# Patient Record
Sex: Male | Born: 1950 | Race: White | Hispanic: No | Marital: Married | Smoking: Never smoker
Health system: Southern US, Community
[De-identification: ages and names within clinical notes are randomized; demographics above are authoritative.]

## PROBLEM LIST (undated history)

## (undated) DIAGNOSIS — M75100 Unspecified rotator cuff tear or rupture of unspecified shoulder, not specified as traumatic: Secondary | ICD-10-CM

## (undated) DIAGNOSIS — E785 Hyperlipidemia, unspecified: Secondary | ICD-10-CM

## (undated) DIAGNOSIS — M1990 Unspecified osteoarthritis, unspecified site: Secondary | ICD-10-CM

## (undated) DIAGNOSIS — T7840XA Allergy, unspecified, initial encounter: Secondary | ICD-10-CM

## (undated) DIAGNOSIS — K219 Gastro-esophageal reflux disease without esophagitis: Secondary | ICD-10-CM

## (undated) HISTORY — DX: Unspecified osteoarthritis, unspecified site: M19.90

## (undated) HISTORY — DX: Hyperlipidemia, unspecified: E78.5

## (undated) HISTORY — DX: Allergy, unspecified, initial encounter: T78.40XA

## (undated) HISTORY — DX: Gastro-esophageal reflux disease without esophagitis: K21.9

---

## 1980-02-17 HISTORY — PX: SEPTOPLASTY: SUR1290

## 1997-02-16 HISTORY — PX: ROTATOR CUFF REPAIR: SHX139

## 2002-02-16 HISTORY — PX: COLONOSCOPY: SHX174

## 2004-02-17 HISTORY — PX: CYSTECTOMY: SUR359

## 2011-09-22 ENCOUNTER — Other Ambulatory Visit: Payer: Self-pay | Admitting: Orthopedic Surgery

## 2011-09-22 ENCOUNTER — Encounter (HOSPITAL_COMMUNITY): Payer: Self-pay | Admitting: Pharmacy Technician

## 2011-09-28 ENCOUNTER — Encounter (HOSPITAL_COMMUNITY)
Admission: RE | Admit: 2011-09-28 | Discharge: 2011-09-28 | Disposition: A | Discharge status: Home / Self Care | Payer: BC Managed Care – PPO | Source: Ambulatory Visit | Attending: Orthopedic Surgery | Admitting: Orthopedic Surgery

## 2011-09-28 ENCOUNTER — Encounter (HOSPITAL_COMMUNITY): Payer: Self-pay

## 2011-09-28 HISTORY — DX: Unspecified rotator cuff tear or rupture of unspecified shoulder, not specified as traumatic: M75.100

## 2011-09-28 LAB — CBC
HCT: 46.3 % (ref 39.0–52.0)
MCHC: 35 g/dL (ref 30.0–36.0)
Platelets: 151 10*3/uL (ref 150–400)
RDW: 13.1 % (ref 11.5–15.5)
WBC: 5.5 10*3/uL (ref 4.0–10.5)

## 2011-09-28 LAB — SURGICAL PCR SCREEN
MRSA, PCR: NEGATIVE
Staphylococcus aureus: NEGATIVE

## 2011-09-28 NOTE — Patient Instructions (Signed)
20 Dean Thompson  09/28/2011   Your procedure is scheduled on:  10/01/11 AT 3:15 PM  Report to SHORT STAY DEPT  at 12:45 PM  Call this number if you have problems the morning of surgery: [phone]   Remember:   Do not eat food  AFTER MIDNIGHT  May have clear liquids UNTIL 6 HOURS BEFORE SURGERY (9:15 AM)  Clear liquids include soda, tea, black coffee, apple or grape juice, broth.  Take these medicines the morning of surgery with A SIP OF WATER: NONE   Do not wear jewelry, make-up or nail polish.  Do not wear lotions, powders, or perfumes.   Do not shave legs or underarms 48 hrs. before surgery (men may shave face)  Do not bring valuables to the hospital.  Contacts, dentures or bridgework may not be worn into surgery.  Leave suitcase in the car. After surgery it may be brought to your room.  For patients admitted to the hospital, checkout time is 11:00 AM the day of discharge.   Patients discharged the day of surgery will not be allowed to drive home.    Special Instructions:   Please read over the following fact sheets that you were given: MRSA  Information               SHOWER WITH BETASEPT THE NIGHT BEFORE SURGERY AND THE MORNING OF SURGERY

## 2011-10-01 ENCOUNTER — Ambulatory Visit (HOSPITAL_COMMUNITY): Payer: BC Managed Care – PPO | Admitting: Anesthesiology

## 2011-10-01 ENCOUNTER — Observation Stay (HOSPITAL_COMMUNITY)
Admission: RE | Admit: 2011-10-01 | Discharge: 2011-10-02 | Disposition: A | Discharge status: Home / Self Care | Payer: BC Managed Care – PPO | Source: Ambulatory Visit | Attending: Orthopedic Surgery | Admitting: Orthopedic Surgery

## 2011-10-01 ENCOUNTER — Encounter (HOSPITAL_COMMUNITY): Payer: Self-pay | Admitting: Anesthesiology

## 2011-10-01 ENCOUNTER — Encounter (HOSPITAL_COMMUNITY): Payer: Self-pay | Admitting: *Deleted

## 2011-10-01 ENCOUNTER — Encounter (HOSPITAL_COMMUNITY)
Admission: RE | Disposition: A | Discharge status: Home / Self Care | Payer: Self-pay | Source: Ambulatory Visit | Attending: Orthopedic Surgery

## 2011-10-01 DIAGNOSIS — M19019 Primary osteoarthritis, unspecified shoulder: Principal | ICD-10-CM | POA: Insufficient documentation

## 2011-10-01 DIAGNOSIS — M75120 Complete rotator cuff tear or rupture of unspecified shoulder, not specified as traumatic: Secondary | ICD-10-CM | POA: Insufficient documentation

## 2011-10-01 DIAGNOSIS — Z8639 Personal history of other endocrine, nutritional and metabolic disease: Secondary | ICD-10-CM | POA: Insufficient documentation

## 2011-10-01 DIAGNOSIS — Z862 Personal history of diseases of the blood and blood-forming organs and certain disorders involving the immune mechanism: Secondary | ICD-10-CM | POA: Insufficient documentation

## 2011-10-01 DIAGNOSIS — Z9889 Other specified postprocedural states: Secondary | ICD-10-CM

## 2011-10-01 DIAGNOSIS — M25519 Pain in unspecified shoulder: Secondary | ICD-10-CM | POA: Insufficient documentation

## 2011-10-01 DIAGNOSIS — Z01812 Encounter for preprocedural laboratory examination: Secondary | ICD-10-CM | POA: Insufficient documentation

## 2011-10-01 HISTORY — PX: SHOULDER OPEN ROTATOR CUFF REPAIR: SHX2407

## 2011-10-01 SURGERY — REPAIR, ROTATOR CUFF, OPEN
Anesthesia: Regional | Site: Shoulder | Laterality: Left | Wound class: Clean

## 2011-10-01 MED ORDER — PROMETHAZINE HCL 25 MG/ML IJ SOLN: 6.2500 mg | INTRAMUSCULAR | Status: DC | PRN

## 2011-10-01 MED ORDER — MIDAZOLAM HCL 2 MG/2ML IJ SOLN
1.0000 mg | INTRAMUSCULAR | Status: DC | PRN
  Administered 2011-10-01: 2 mg via INTRAVENOUS

## 2011-10-01 MED ORDER — ROCURONIUM BROMIDE 100 MG/10ML IV SOLN
INTRAVENOUS | Status: DC | PRN
  Administered 2011-10-01: 40 mg via INTRAVENOUS

## 2011-10-01 MED ORDER — CEFAZOLIN SODIUM-DEXTROSE 2-3 GM-% IV SOLR
2.0000 g | INTRAVENOUS | Status: AC
  Administered 2011-10-01: 2 g via INTRAVENOUS

## 2011-10-01 MED ORDER — FENTANYL CITRATE 0.05 MG/ML IJ SOLN
INTRAMUSCULAR | Status: AC
  Filled 2011-10-01: qty 2

## 2011-10-01 MED ORDER — MIDAZOLAM HCL 2 MG/2ML IJ SOLN
INTRAMUSCULAR | Status: AC
  Filled 2011-10-01: qty 2

## 2011-10-01 MED ORDER — ONDANSETRON HCL 4 MG PO TABS: 4.0000 mg | ORAL_TABLET | Freq: Four times a day (QID) | ORAL | Status: DC | PRN

## 2011-10-01 MED ORDER — ACETAMINOPHEN 10 MG/ML IV SOLN
INTRAVENOUS | Status: AC
  Filled 2011-10-01: qty 100

## 2011-10-01 MED ORDER — FENTANYL CITRATE 0.05 MG/ML IJ SOLN
INTRAMUSCULAR | Status: DC | PRN
  Administered 2011-10-01: 100 ug via INTRAVENOUS

## 2011-10-01 MED ORDER — ONDANSETRON HCL 4 MG/2ML IJ SOLN
INTRAMUSCULAR | Status: DC | PRN
  Administered 2011-10-01: 4 mg via INTRAVENOUS

## 2011-10-01 MED ORDER — HEMOSTATIC AGENTS (NO CHARGE) OPTIME
TOPICAL | Status: DC | PRN
  Administered 2011-10-01: 1 via TOPICAL

## 2011-10-01 MED ORDER — OXYCODONE HCL 5 MG PO TABS: 5.0000 mg | ORAL_TABLET | Freq: Once | ORAL | Status: DC | PRN

## 2011-10-01 MED ORDER — MENTHOL 3 MG MT LOZG
1.0000 | LOZENGE | OROMUCOSAL | Status: DC | PRN
  Filled 2011-10-01: qty 9

## 2011-10-01 MED ORDER — ACETAMINOPHEN 325 MG PO TABS: 650.0000 mg | ORAL_TABLET | Freq: Four times a day (QID) | ORAL | Status: DC | PRN

## 2011-10-01 MED ORDER — OXYCODONE HCL 5 MG PO TABS
5.0000 mg | ORAL_TABLET | ORAL | Status: DC | PRN
  Administered 2011-10-01 – 2011-10-02 (×4): 5 mg via ORAL
  Filled 2011-10-01 (×4): qty 1

## 2011-10-01 MED ORDER — POVIDONE-IODINE 10 % EX SOLN
CUTANEOUS | Status: DC | PRN
  Administered 2011-10-01: 1 via TOPICAL

## 2011-10-01 MED ORDER — METHOCARBAMOL 500 MG PO TABS
500.0000 mg | ORAL_TABLET | Freq: Four times a day (QID) | ORAL | Status: DC | PRN
  Administered 2011-10-01 – 2011-10-02 (×2): 500 mg via ORAL
  Filled 2011-10-01 (×2): qty 1

## 2011-10-01 MED ORDER — GLYCOPYRROLATE 0.2 MG/ML IJ SOLN
INTRAMUSCULAR | Status: DC | PRN
  Administered 2011-10-01: .4 mg via INTRAVENOUS

## 2011-10-01 MED ORDER — 0.9 % SODIUM CHLORIDE (POUR BTL) OPTIME
TOPICAL | Status: DC | PRN
  Administered 2011-10-01: 1000 mL

## 2011-10-01 MED ORDER — METOCLOPRAMIDE HCL 10 MG PO TABS: 5.0000 mg | ORAL_TABLET | Freq: Three times a day (TID) | ORAL | Status: DC | PRN

## 2011-10-01 MED ORDER — OXYCODONE HCL 5 MG/5ML PO SOLN
5.0000 mg | Freq: Once | ORAL | Status: DC | PRN
  Filled 2011-10-01: qty 5

## 2011-10-01 MED ORDER — ROPIVACAINE HCL 5 MG/ML IJ SOLN
INTRAMUSCULAR | Status: AC
  Filled 2011-10-01: qty 30

## 2011-10-01 MED ORDER — ONDANSETRON HCL 4 MG/2ML IJ SOLN
4.0000 mg | Freq: Four times a day (QID) | INTRAMUSCULAR | Status: DC | PRN
  Administered 2011-10-02: 4 mg via INTRAVENOUS
  Filled 2011-10-01: qty 2

## 2011-10-01 MED ORDER — EPHEDRINE SULFATE 50 MG/ML IJ SOLN
INTRAMUSCULAR | Status: DC | PRN
  Administered 2011-10-01: 5 mg via INTRAVENOUS

## 2011-10-01 MED ORDER — ACETAMINOPHEN 10 MG/ML IV SOLN
INTRAVENOUS | Status: DC | PRN
  Administered 2011-10-01: 1000 mg via INTRAVENOUS

## 2011-10-01 MED ORDER — DEXTROSE 5 % IV SOLN
500.0000 mg | Freq: Four times a day (QID) | INTRAVENOUS | Status: DC | PRN
  Filled 2011-10-01: qty 5

## 2011-10-01 MED ORDER — CEFAZOLIN SODIUM-DEXTROSE 2-3 GM-% IV SOLR
2.0000 g | Freq: Four times a day (QID) | INTRAVENOUS | Status: AC
  Administered 2011-10-01 – 2011-10-02 (×3): 2 g via INTRAVENOUS
  Filled 2011-10-01 (×3): qty 50

## 2011-10-01 MED ORDER — CEFAZOLIN SODIUM-DEXTROSE 2-3 GM-% IV SOLR
INTRAVENOUS | Status: AC
  Filled 2011-10-01: qty 50

## 2011-10-01 MED ORDER — FENTANYL CITRATE 0.05 MG/ML IJ SOLN
50.0000 ug | INTRAMUSCULAR | Status: DC | PRN
  Administered 2011-10-01: 50 ug via INTRAVENOUS

## 2011-10-01 MED ORDER — ADULT MULTIVITAMIN W/MINERALS CH
1.0000 | ORAL_TABLET | Freq: Every morning | ORAL | Status: DC
  Filled 2011-10-01: qty 1

## 2011-10-01 MED ORDER — HYDROMORPHONE HCL PF 1 MG/ML IJ SOLN: 0.2500 mg | INTRAMUSCULAR | Status: DC | PRN

## 2011-10-01 MED ORDER — ACETAMINOPHEN 10 MG/ML IV SOLN: 1000.0000 mg | Freq: Once | INTRAVENOUS | Status: DC | PRN

## 2011-10-01 MED ORDER — PROPOFOL 10 MG/ML IV EMUL
INTRAVENOUS | Status: DC | PRN
  Administered 2011-10-01: 180 mg via INTRAVENOUS

## 2011-10-01 MED ORDER — METOCLOPRAMIDE HCL 5 MG/ML IJ SOLN: 5.0000 mg | Freq: Three times a day (TID) | INTRAMUSCULAR | Status: DC | PRN

## 2011-10-01 MED ORDER — LIDOCAINE HCL (CARDIAC) 20 MG/ML IV SOLN
INTRAVENOUS | Status: DC | PRN
  Administered 2011-10-01: 100 mg via INTRAVENOUS

## 2011-10-01 MED ORDER — PHENOL 1.4 % MT LIQD
1.0000 | OROMUCOSAL | Status: DC | PRN
  Filled 2011-10-01: qty 177

## 2011-10-01 MED ORDER — ACETAMINOPHEN 650 MG RE SUPP: 650.0000 mg | Freq: Four times a day (QID) | RECTAL | Status: DC | PRN

## 2011-10-01 MED ORDER — DEXTROSE-NACL 5-0.45 % IV SOLN
INTRAVENOUS | Status: DC
  Administered 2011-10-01: 22:00:00 via INTRAVENOUS

## 2011-10-01 MED ORDER — BUPIVACAINE-EPINEPHRINE (PF) 0.5% -1:200000 IJ SOLN
INTRAMUSCULAR | Status: AC
  Filled 2011-10-01: qty 10

## 2011-10-01 MED ORDER — MEPERIDINE HCL 50 MG/ML IJ SOLN: 6.2500 mg | INTRAMUSCULAR | Status: DC | PRN

## 2011-10-01 MED ORDER — NEOSTIGMINE METHYLSULFATE 1 MG/ML IJ SOLN
INTRAMUSCULAR | Status: DC | PRN
  Administered 2011-10-01: 3 mg via INTRAVENOUS

## 2011-10-01 MED ORDER — LACTATED RINGERS IV SOLN
INTRAVENOUS | Status: DC
  Administered 2011-10-01: 17:00:00 via INTRAVENOUS
  Administered 2011-10-01: 1000 mL via INTRAVENOUS

## 2011-10-01 SURGICAL SUPPLY — 50 items
ANCHOR PEEK ZIP 5.5 NDL NO2 (Orthopedic Implant) ×2 IMPLANT
BENZOIN TINCTURE PRP APPL 2/3 (GAUZE/BANDAGES/DRESSINGS) ×2 IMPLANT
BLADE 4.2CUDA (BLADE) IMPLANT
BLADE SURG SZ11 CARB STEEL (BLADE) ×2 IMPLANT
BOOTIES KNEE HIGH SLOAN (MISCELLANEOUS) IMPLANT
BUR OVAL 4.0 (BURR) IMPLANT
CLEANER TIP ELECTROSURG 2X2 (MISCELLANEOUS) ×2 IMPLANT
CLOTH BEACON ORANGE TIMEOUT ST (SAFETY) ×2 IMPLANT
DRAPE LG THREE QUARTER DISP (DRAPES) ×2 IMPLANT
DRAPE POUCH INSTRU U-SHP 10X18 (DRAPES) ×2 IMPLANT
DRAPE SHOULDER BEACH CHAIR (DRAPES) ×2 IMPLANT
DRAPE U-SHAPE 47X51 STRL (DRAPES) ×2 IMPLANT
DRSG ADAPTIC 3X8 NADH LF (GAUZE/BANDAGES/DRESSINGS) ×2 IMPLANT
DRSG PAD ABDOMINAL 8X10 ST (GAUZE/BANDAGES/DRESSINGS) ×2 IMPLANT
DURAPREP 26ML APPLICATOR (WOUND CARE) ×2 IMPLANT
ELECT KIT MENISCUS BASIC 165 (SET/KITS/TRAYS/PACK) IMPLANT
ELECT REM PT RETURN 9FT ADLT (ELECTROSURGICAL) ×2
ELECTRODE REM PT RTRN 9FT ADLT (ELECTROSURGICAL) ×1 IMPLANT
GLOVE BIOGEL PI IND STRL 8 (GLOVE) ×1 IMPLANT
GLOVE BIOGEL PI INDICATOR 8 (GLOVE) ×1
GLOVE ECLIPSE 8.0 STRL XLNG CF (GLOVE) ×2 IMPLANT
GLOVE INDICATOR 8.0 STRL GRN (GLOVE) ×4 IMPLANT
GOWN STRL REIN XL XLG (GOWN DISPOSABLE) ×4 IMPLANT
MANIFOLD NEPTUNE II (INSTRUMENTS) ×2 IMPLANT
NDL SAFETY ECLIPSE 18X1.5 (NEEDLE) ×1 IMPLANT
NEEDLE HYPO 18GX1.5 SHARP (NEEDLE) ×1
NEEDLE MA TROC 1/2 (NEEDLE) IMPLANT
NEEDLE SPNL 18GX3.5 QUINCKE PK (NEEDLE) ×2 IMPLANT
PACK SHOULDER CUSTOM OPM052 (CUSTOM PROCEDURE TRAY) ×2 IMPLANT
POSITIONER SURGICAL ARM (MISCELLANEOUS) ×2 IMPLANT
SET ARTHROSCOPY TUBING (MISCELLANEOUS)
SET ARTHROSCOPY TUBING LN (MISCELLANEOUS) IMPLANT
SLING ARM IMMOBILIZER LRG (SOFTGOODS) ×2 IMPLANT
SLING ARM IMMOBILIZER MED (SOFTGOODS) IMPLANT
SPONGE GAUZE 4X4 12PLY (GAUZE/BANDAGES/DRESSINGS) ×2 IMPLANT
STAPLER VISISTAT (STAPLE) IMPLANT
STRIP CLOSURE SKIN 1/2X4 (GAUZE/BANDAGES/DRESSINGS) IMPLANT
SUCTION FRAZIER TIP 10 FR DISP (SUCTIONS) ×2 IMPLANT
SUT BONE WAX W31G (SUTURE) ×2 IMPLANT
SUT ETHIBOND NAB CT1 #1 30IN (SUTURE) ×2 IMPLANT
SUT ETHILON 4 0 PS 2 18 (SUTURE) IMPLANT
SUT VIC AB 1 CT1 27 (SUTURE) ×2
SUT VIC AB 1 CT1 27XBRD ANTBC (SUTURE) ×2 IMPLANT
SUT VIC AB 2-0 CT1 27 (SUTURE)
SUT VIC AB 2-0 CT1 27XBRD (SUTURE) IMPLANT
SYR 3ML LL SCALE MARK (SYRINGE) ×2 IMPLANT
TAPE CLOTH SURG 6X10 WHT LF (GAUZE/BANDAGES/DRESSINGS) ×2 IMPLANT
TUBING CONNECTING 10 (TUBING) IMPLANT
WAND 90 DEG TURBOVAC W/CORD (SURGICAL WAND) IMPLANT
WATER STERILE IRR 500ML POUR (IV SOLUTION) ×2 IMPLANT

## 2011-10-01 NOTE — Anesthesia Postprocedure Evaluation (Signed)
Anesthesia Post Note  Patient: Dean Thompson  Procedure(s) Performed: Procedure(s) (LRB): ROTATOR CUFF REPAIR SHOULDER OPEN (Left)  Anesthesia type: General with interscalene block.  Patient location: PACU  Post pain: Pain level controlled  Post assessment: Post-op Vital signs reviewed  Last Vitals: BP 104/65  Pulse 85  Temp 36.5 C (Oral)  Resp 16  SpO2 98%  Post vital signs: Reviewed  Level of consciousness: sedated  Complications: No apparent anesthesia complications

## 2011-10-01 NOTE — Anesthesia Preprocedure Evaluation (Addendum)
Anesthesia Evaluation  Patient identified by MRN, date of birth, ID band Patient awake    Reviewed: Allergy & Precautions, H&P , NPO status , Patient's Chart, lab work & pertinent test results  History of Anesthesia Complications (+) AWARENESS UNDER ANESTHESIA  Airway Mallampati: II TM Distance: >3 FB Neck ROM: Full    Dental  (+) Teeth Intact and Dental Advisory Given   Pulmonary  breath sounds clear to auscultation  Pulmonary exam normal       Cardiovascular Exercise Tolerance: Good Rhythm:Regular Rate:Normal     Neuro/Psych    GI/Hepatic Neg liver ROS,   Endo/Other  negative endocrine ROS  Renal/GU negative Renal ROS     Musculoskeletal   Abdominal   Peds  Hematology negative hematology ROS (+)   Anesthesia Other Findings   Reproductive/Obstetrics                          Anesthesia Physical Anesthesia Plan  ASA: I  Anesthesia Plan: General and Regional   Post-op Pain Management:    Induction: Intravenous  Airway Management Planned: Oral ETT  Additional Equipment:   Intra-op Plan:   Post-operative Plan: Extubation in OR  Informed Consent: I have reviewed the patients History and Physical, chart, labs and discussed the procedure including the risks, benefits and alternatives for the proposed anesthesia with the patient or authorized representative who has indicated his/her understanding and acceptance.   Dental advisory given  Plan Discussed with: CRNA and Surgeon  Anesthesia Plan Comments:         Anesthesia Quick Evaluation

## 2011-10-01 NOTE — Preoperative (Signed)
Beta Blockers   Reason not to administer Beta Blockers:Not Applicable 

## 2011-10-01 NOTE — Transfer of Care (Signed)
Immediate Anesthesia Transfer of Care Note  Patient: Dean Thompson  Procedure(s) Performed: Procedure(s) (LRB): ROTATOR CUFF REPAIR SHOULDER OPEN (Left)  Patient Location: PACU  Anesthesia Type: General  Level of Consciousness: awake, alert  and oriented  Airway & Oxygen Therapy: Patient Spontanous Breathing and Patient connected to face mask oxygen  Post-op Assessment: Report given to PACU RN and Post -op Vital signs reviewed and stable  Post vital signs: Reviewed and stable  Complications: No apparent anesthesia complications

## 2011-10-01 NOTE — Brief Op Note (Signed)
10/01/2011  5:34 PM  PATIENT:  Dean Thompson  61 y.o. male  PRE-OPERATIVE DIAGNOSIS:  Left Shoulder Rotator Cuff Tear and arthritis of AC joint  POST-OPERATIVE DIAGNOSIS:  Left Shoulder Rotator Cuff Tear and arthritis of AC joint  PROCEDURE:  Procedure(s) (LRB): ROTATOR CUFF REPAIR SHOULDER OPEN (Left)with anterior acromionectomy; open distal clavicle resection  SURGEON:  Surgeon(s) and Role:    * Drucilla Schmidt, MD - Primary  PHYSICIAN ASSISTANT:   ASSISTANTS:extra scrub tech  ANESTHESIA:   regional and general  EBL:  Total I/O In: 2000 [I.V.:2000] Out: 75 [Blood:75]  BLOOD ADMINISTERED:none  DRAINS: none   LOCAL MEDICATIONS USED:  NONE  SPECIMEN:  No Specimen  DISPOSITION OF SPECIMEN:  N/A  COUNTS:  YES  TOURNIQUET:  * No tourniquets in log *  DICTATION: .Other Dictation: Dictation Number [id number]  PLAN OF CARE: Admit for overnight observation  PATIENT DISPOSITION:  PACU - hemodynamically stable.   Delay start of Pharmacological VTE agent (>24hrs) due to surgical blood loss or risk of bleeding: yes

## 2011-10-01 NOTE — Anesthesia Procedure Notes (Addendum)
Anesthesia Regional Block:  Interscalene brachial plexus block  Pre-Anesthetic Checklist: ,, timeout performed, Correct Patient, Correct Site, Correct Laterality, Correct Procedure, Correct Position, site marked, Risks and benefits discussed,  Surgical consent,  Pre-op evaluation,  At surgeon's request and post-op pain management  Laterality: Left and Upper  Prep: Maximum Sterile Barrier Precautions used and chloraprep       Needles:  Injection technique: Single-shot  Needle Type: Stimiplex     Needle Length: 10cm 10 cm   Needle insertion depth: 5 cm   Additional Needles:  Procedures: ultrasound guided and nerve stimulator  Motor weakness within 10 minutes. Interscalene brachial plexus block  Nerve Stimulator or Paresthesia:  Response: Deltoid, 0.5 mA, 5 cm  Additional Responses:   Narrative:  Start time: 10/01/2011 2:20 PM End time: 10/01/2011 2:40 PM Injection made incrementally with aspirations every 5 mL.  Performed by: Personally  Anesthesiologist: Sandar Krinke  Additional Notes: Left interscalene block performed under normal sterile conditions. Patient tolerated well. No known complications.      Narrative:       Narrative:    Anesthesia Regional Block:   Narrative:

## 2011-10-01 NOTE — H&P (Signed)
Dean Thompson is an 61 y.o. male.   Chief Complaint: painful lt shoulder HPI: Mri demonstrates complete rotator cuff tear and advanced degenerative arthritis ac joint  Past Medical History  Diagnosis Date  . Rotator cuff tear     left  . Hx of gout     Past Surgical History  Procedure Date  . Rotator cuff repair 1999  . Septoplasty 1982  . Cystectomy 2006    subacious cyst  remove from neck    History reviewed. No pertinent family history. Social History:  reports that he has never smoked. He does not have any smokeless tobacco history on file. He reports that he drinks alcohol. He reports that he does not use illicit drugs.  Allergies: No Known Allergies  Medications Prior to Admission  Medication Sig Dispense Refill  . Multiple Vitamin (MULTIVITAMIN WITH MINERALS) TABS Take 1 tablet by mouth every morning.        No results found for this or any previous visit (from the past 48 hour(s)). No results found.  ROS  Blood pressure 112/76, pulse 60, temperature 97.7 F (36.5 C), temperature source Oral, resp. rate 18, SpO2 97.00%. Physical Exam  Constitutional: He is oriented to person, place, and time. He appears well-developed and well-nourished.  HENT:  Head: Normocephalic and atraumatic.  Right Ear: External ear normal.  Left Ear: External ear normal.  Eyes: Conjunctivae and EOM are normal. Pupils are equal, round, and reactive to light.  Neck: Normal range of motion. Neck supple.  Cardiovascular: Normal rate, regular rhythm, normal heart sounds and intact distal pulses.   Respiratory: Effort normal and breath sounds normal.  GI: Soft. Bowel sounds are normal.  Musculoskeletal: Normal range of motion.       S/P interscalene block lt shoulder  Neurological: He is alert and oriented to person, place, and time. He has normal reflexes.  Skin: Skin is warm and dry.  Psychiatric: He has a normal mood and affect. His behavior is normal. Judgment and thought content  normal.     Assessment/Plan Complete rotator cuff tear and ac arthritis lt shoulder Anterior acromionectomy with rotator cuff repair and resection distal clavicle   APLINGTON,JAMES P 10/01/2011, 3:37 PM

## 2011-10-02 ENCOUNTER — Encounter (HOSPITAL_COMMUNITY): Payer: Self-pay | Admitting: Orthopedic Surgery

## 2011-10-02 MED ORDER — METHOCARBAMOL 500 MG PO TABS: 500.0000 mg | ORAL_TABLET | Freq: Four times a day (QID) | ORAL | Status: AC | PRN

## 2011-10-02 MED ORDER — METHOCARBAMOL 500 MG PO TABS: 500.0000 mg | ORAL_TABLET | Freq: Four times a day (QID) | ORAL | Status: AC

## 2011-10-02 MED ORDER — OXYCODONE-ACETAMINOPHEN 7.5-325 MG PO TABS: 1.0000 | ORAL_TABLET | ORAL | Status: AC | PRN

## 2011-10-02 NOTE — Op Note (Signed)
Dean Thompson, Dean Thompson               ACCOUNT NO.:  [id number]  MEDICAL RECORD NO.:  [id number]  LOCATION:  1616                         FACILITY:  [facility]  PHYSICIAN:  Marlowe Kays, M.D.  DATE OF BIRTH:  05/19/1950  DATE OF PROCEDURE:  10/01/2011 DATE OF DISCHARGE:                              OPERATIVE REPORT   PREOPERATIVE DIAGNOSES: 1. Torn complete rotator cuff tear with retraction. 2. Acromioclavicular arthritis, left shoulder.  POSTOPERATIVE DIAGNOSES: 1. Torn complete rotator cuff tear with retraction. 2. Acromioclavicular arthritis, left shoulder.  OPERATIONS: 1. Open distal clavicle resection. 2. Open anterior acromionectomy with repair of torn rotator cuff.  SURGEON:  Marlowe Kays, MD  ASSISTANT:  Extra scrub nurse.  ANESTHESIA:  General preceded by interscalene block.  ANESTHESIA:  General anesthesia with interscalene block.  PATHOLOGY AND JUSTIFICATION FOR PROCEDURE:  Painful shoulder with MRI demonstrating a complete rotator cuff tear with retraction and advanced degenerative arthritis of the AC joint.  PROCEDURE IN DETAIL:  Interscalene block by Anesthesia.  Prophylactic antibiotics, satisfied general anesthesia on the sliding frame in the beach chair position, left shoulder girdle was prepped with DuraPrep, draped in sterile field.  Ioban employed.  Time-out performed.  I made an incision over the distal clavicle, then curving down over the anterior acromion.  After identifying the [facility] joint and dissecting out the distal clavicle, I measured 1.5 cm from the [facility] joint and protecting the underlying structures.  Using micro saw, cut the clavicle at this point.  On checking, there were no residual spicules of bone.  I covered the raw bone with bone wax.  I then continued the dissection lateralward identifying the anterior acromion.  It was mildly curve, but had a large beak anteriorly.  After undermining anteriorly beneath it with a Cobb elevator, I made my  initial anterior acromionectomy with a micro saw, no subacromial space and I kept removing bone with a micro saw.  I then rasped it with a rasp until we had a wide subacromial decompression.  I performed a partial resection of the subdeltoid bursa.  The rotator cuff tear as depicted on the MRI was confirmed with a full-thickness complete rotator cuff tear with about 3 cm of retraction.  I looked underneath it and found no layering.  I then denuded articular cartilage of the greater tuberosity with small rongeur, and using a single four-stranded Stryker rotator cuff anchor 5.5, I was able to enter along the rotator cuff tear and bring it lateralward tying the initial sutures.  I then made additional tack-down sutures along the lateral part of the rotator cuff into the bursa and soft tissue on the lateral humerus.  This gave a nice tight, secured repair with his arm to his side.  I then irrigated the wound with sterile saline and placed Gelfoam in the resection site of the clavicle.  I then closed the wound in layers over it with interrupted #1 Vicryl in the fascia over the clavicle resection site over the anterior acromionectomy site and then a small incision on the deltoid muscle.  Subcutaneous tissue was closed with 2-0 Vicryl, staples in the skin.  Betadine, Adaptic, dry sterile dressing were applied.  He tolerated the  procedure well, was taken to the recovery room in satisfactory condition with no known complications.          ______________________________ Marlowe Kays, M.D.     JA/MEDQ  D:  10/01/2011  T:  10/02/2011  Job:  [id number]

## 2011-10-02 NOTE — Progress Notes (Signed)
Patient ID: Dean Thompson, male   DOB: 1950-06-13, 61 y.o.   MRN: [id number] Comfortable night.  Voiding.  Plan discharge.  Instructions given.

## 2011-10-02 NOTE — Progress Notes (Signed)
Occupational Therapy Note Eval completed and located in shadow chart. All education completed with pt and girlfriend. No further acute OT needs. Judithann Sauger OTR/L [phone] 10/02/2011

## 2011-10-08 NOTE — Discharge Summary (Signed)
Dean Thompson, Dean Thompson               ACCOUNT NO.:  [id number]  MEDICAL RECORD NO.:  [id number]  LOCATION:  1616                         FACILITY:  [facility]  PHYSICIAN:  Marlowe Kays, M.D.  DATE OF BIRTH:  [birth year]  DATE OF ADMISSION:  10/01/2011 DATE OF DISCHARGE:  10/02/2011                              DISCHARGE SUMMARY   ADMITTING DIAGNOSES: 1. Torn complete rotator cuff tear with retraction. 2. Acromioclavicular joint arthritis, left shoulder.  DISCHARGE DIAGNOSES: 1. Torn complete rotator cuff tear with retraction. 2. Acromioclavicular joint arthritis, left shoulder.  OPERATION:  On October 01, 2011: 1. Open distal clavicle resection. 2. Open anterior acromionectomy with repair of torn rotator cuff.  SURGEON:  Marlowe Kays, M.D.  SUMMARY:  This man has had chronically painful shoulder with an MRI demonstrating complete rotator cuff tear with retraction and advanced degenerative arthritis of the [facility] joint.  Consequently, he is admitted at this time for the above-mentioned surgery.  This was performed without complication.  He had an interscalene block followed by general anesthetic.  Postop course was uncomplicated.  At the time of discharge, he was afebrile and comfortable.  He was given discharge instructions to keep the wound dry and used a shoulder immobilizer except flexes elbow and to return to my office in 3-4 days for inspection of the wound.  DISCHARGE MEDICATIONS: 1. Norco 7.5-325. 2. Robaxin 500.  CONDITION AT DISCHARGE:  Stable.          ______________________________ Marlowe Kays, M.D.     JA/MEDQ  D:  10/07/2011  T:  10/07/2011  Job:  [id number]

## 2015-07-05 DIAGNOSIS — L03019 Cellulitis of unspecified finger: Secondary | ICD-10-CM | POA: Diagnosis not present

## 2015-07-26 DIAGNOSIS — H5213 Myopia, bilateral: Secondary | ICD-10-CM | POA: Diagnosis not present

## 2015-07-26 DIAGNOSIS — H2513 Age-related nuclear cataract, bilateral: Secondary | ICD-10-CM | POA: Diagnosis not present

## 2015-07-29 ENCOUNTER — Ambulatory Visit (INDEPENDENT_AMBULATORY_CARE_PROVIDER_SITE_OTHER): Payer: Medicare Other | Admitting: Adult Health

## 2015-07-29 ENCOUNTER — Encounter: Payer: Self-pay | Admitting: Adult Health

## 2015-07-29 VITALS — BP 110/64 | Temp 97.4°F | Ht 70.0 in | Wt 202.5 lb

## 2015-07-29 DIAGNOSIS — I868 Varicose veins of other specified sites: Secondary | ICD-10-CM

## 2015-07-29 DIAGNOSIS — Z1211 Encounter for screening for malignant neoplasm of colon: Secondary | ICD-10-CM

## 2015-07-29 DIAGNOSIS — I8390 Asymptomatic varicose veins of unspecified lower extremity: Secondary | ICD-10-CM

## 2015-07-29 DIAGNOSIS — Z7189 Other specified counseling: Secondary | ICD-10-CM

## 2015-07-29 DIAGNOSIS — Z7689 Persons encountering health services in other specified circumstances: Secondary | ICD-10-CM

## 2015-07-29 DIAGNOSIS — Z23 Encounter for immunization: Secondary | ICD-10-CM

## 2015-07-29 NOTE — Patient Instructions (Addendum)
It was great meeting you today!  Someone will call you to schedule your appointments for the colonoscopy and to the vein center  Follow up with me for your " Welcome to Medicare" visit - this will be your physical   Please let me know if you need anything in the meantime.  Health Maintenance, Male A healthy lifestyle and preventative care can promote health and wellness.  Maintain regular health, dental, and eye exams.  Eat a healthy diet. Foods like vegetables, fruits, whole grains, low-fat dairy products, and lean protein foods contain the nutrients you need and are low in calories. Decrease your intake of foods high in solid fats, added sugars, and salt. Get information about a proper diet from your health care provider, if necessary.  Regular physical exercise is one of the most important things you can do for your health. Most adults should get at least 150 minutes of moderate-intensity exercise (any activity that increases your heart rate and causes you to sweat) each week. In addition, most adults need muscle-strengthening exercises on 2 or more days a week.   Maintain a healthy weight. The body mass index (BMI) is a screening tool to identify possible weight problems. It provides an estimate of body fat based on height and weight. Your health care provider can find your BMI and can help you achieve or maintain a healthy weight. For males 20 years and older:  A BMI below 18.5 is considered underweight.  A BMI of 18.5 to 24.9 is normal.  A BMI of 25 to 29.9 is considered overweight.  A BMI of 30 and above is considered obese.  Maintain normal blood lipids and cholesterol by exercising and minimizing your intake of saturated fat. Eat a balanced diet with plenty of fruits and vegetables. Blood tests for lipids and cholesterol should begin at age 64 and be repeated every 5 years. If your lipid or cholesterol levels are high, you are over age 55, or you are at high risk for heart  disease, you may need your cholesterol levels checked more frequently.Ongoing high lipid and cholesterol levels should be treated with medicines if diet and exercise are not working.  If you smoke, find out from your health care provider how to quit. If you do not use tobacco, do not start.  Lung cancer screening is recommended for adults aged 51-80 years who are at high risk for developing lung cancer because of a history of smoking. A yearly low-dose CT scan of the lungs is recommended for people who have at least a 30-pack-year history of smoking and are current smokers or have quit within the past 15 years. A pack year of smoking is smoking an average of 1 pack of cigarettes a day for 1 year (for example, a 30-pack-year history of smoking could mean smoking 1 pack a day for 30 years or 2 packs a day for 15 years). Yearly screening should continue until the smoker has stopped smoking for at least 15 years. Yearly screening should be stopped for people who develop a health problem that would prevent them from having lung cancer treatment.  If you choose to drink alcohol, do not have more than 2 drinks per day. One drink is considered to be 12 oz (360 mL) of beer, 5 oz (150 mL) of wine, or 1.5 oz (45 mL) of liquor.  Avoid the use of street drugs. Do not share needles with anyone. Ask for help if you need support or instructions about stopping the  use of drugs.  High blood pressure causes heart disease and increases the risk of stroke. High blood pressure is more likely to develop in:  People who have blood pressure in the end of the normal range (100-139/85-89 mm Hg).  People who are overweight or obese.  People who are African American.  If you are 17-81 years of age, have your blood pressure checked every 3-5 years. If you are 42 years of age or older, have your blood pressure checked every year. You should have your blood pressure measured twice--once when you are at a hospital or clinic, and  once when you are not at a hospital or clinic. Record the average of the two measurements. To check your blood pressure when you are not at a hospital or clinic, you can use:  An automated blood pressure machine at a pharmacy.  A home blood pressure monitor.  If you are 65-45 years old, ask your health care provider if you should take aspirin to prevent heart disease.  Diabetes screening involves taking a blood sample to check your fasting blood sugar level. This should be done once every 3 years after age 84 if you are at a normal weight and without risk factors for diabetes. Testing should be considered at a younger age or be carried out more frequently if you are overweight and have at least 1 risk factor for diabetes.  Colorectal cancer can be detected and often prevented. Most routine colorectal cancer screening begins at the age of 90 and continues through age 66. However, your health care provider may recommend screening at an earlier age if you have risk factors for colon cancer. On a yearly basis, your health care provider may provide home test kits to check for hidden blood in the stool. A small camera at the end of a tube may be used to directly examine the colon (sigmoidoscopy or colonoscopy) to detect the earliest forms of colorectal cancer. Talk to your health care provider about this at age 60 when routine screening begins. A direct exam of the colon should be repeated every 5-10 years through age 56, unless early forms of precancerous polyps or small growths are found.  People who are at an increased risk for hepatitis B should be screened for this virus. You are considered at high risk for hepatitis B if:  You were born in a country where hepatitis B occurs often. Talk with your health care provider about which countries are considered high risk.  Your parents were born in a high-risk country and you have not received a shot to protect against hepatitis B (hepatitis B  vaccine).  You have HIV or AIDS.  You use needles to inject street drugs.  You live with, or have sex with, someone who has hepatitis B.  You are a man who has sex with other men (MSM).  You get hemodialysis treatment.  You take certain medicines for conditions like cancer, organ transplantation, and autoimmune conditions.  Hepatitis C blood testing is recommended for all people born from [birth year] through 1965 and any individual with known risk factors for hepatitis C.  Healthy men should no longer receive prostate-specific antigen (PSA) blood tests as part of routine cancer screening. Talk to your health care provider about prostate cancer screening.  Testicular cancer screening is not recommended for adolescents or adult males who have no symptoms. Screening includes self-exam, a health care provider exam, and other screening tests. Consult with your health care provider about any symptoms  you have or any concerns you have about testicular cancer.  Practice safe sex. Use condoms and avoid high-risk sexual practices to reduce the spread of sexually transmitted infections (STIs).  You should be screened for STIs, including gonorrhea and chlamydia if:  You are sexually active and are younger than 24 years.  You are older than 24 years, and your health care provider tells you that you are at risk for this type of infection.  Your sexual activity has changed since you were last screened, and you are at an increased risk for chlamydia or gonorrhea. Ask your health care provider if you are at risk.  If you are at risk of being infected with HIV, it is recommended that you take a prescription medicine daily to prevent HIV infection. This is called pre-exposure prophylaxis (PrEP). You are considered at risk if:  You are a man who has sex with other men (MSM).  You are a heterosexual man who is sexually active with multiple partners.  You take drugs by injection.  You are sexually active  with a partner who has HIV.  Talk with your health care provider about whether you are at high risk of being infected with HIV. If you choose to begin PrEP, you should first be tested for HIV. You should then be tested every 3 months for as long as you are taking PrEP.  Use sunscreen. Apply sunscreen liberally and repeatedly throughout the day. You should seek shade when your shadow is shorter than you. Protect yourself by wearing long sleeves, pants, a wide-brimmed hat, and sunglasses year round whenever you are outdoors.  Tell your health care provider of new moles or changes in moles, especially if there is a change in shape or color. Also, tell your health care provider if a mole is larger than the size of a pencil eraser.  A one-time screening for abdominal aortic aneurysm (AAA) and surgical repair of large AAAs by ultrasound is recommended for men aged 22-75 years who are current or former smokers.  Stay current with your vaccines (immunizations).   This information is not intended to replace advice given to you by your health care provider. Make sure you discuss any questions you have with your health care provider.   Document Released: 08/01/2007 Document Revised: 02/23/2014 Document Reviewed: 06/30/2010 Elsevier Interactive Patient Education [employer].

## 2015-07-29 NOTE — Progress Notes (Signed)
Patient presents to clinic today to establish care. He is a pleasant and healthy 65 year old male who  has a past medical history of Rotator cuff tear and Allergy.   His last physical was " 5 years ago"  Acute Concerns: Establish Care   Chronic Issues:  Varicose veins  - He has had varicose veins for the last 20 years and has not had any issues with this but would like to have them checked out.   Health Maintenance: Dental -- has not seen in the last 5 years  Vision -- yearly  Immunizations -- Needs Tetanus, UTD  Colonoscopy -- 2005   Diet: Does not eat fried foods, likes to eat healthy  Exercise: 3-4 days, weight lifting and running.    Past Medical History  Diagnosis Date  . Rotator cuff tear     left  . Allergy     Past Surgical History  Procedure Laterality Date  . Rotator cuff repair  1999  . Septoplasty  1982  . Cystectomy  2006    subacious cyst  remove from neck  . Shoulder open rotator cuff repair  10/01/2011    Procedure: ROTATOR CUFF REPAIR SHOULDER OPEN;  Surgeon: Magnus Sinning, MD;  Location: [facility];  Service: Orthopedics;  Laterality: Left;  Left Shoulder Acrominectomy/Open Rotator Cuff Repair/Distal Clavicle Resection    Current Outpatient Prescriptions on File Prior to Visit  Medication Sig Dispense Refill  . Multiple Vitamin (MULTIVITAMIN WITH MINERALS) TABS Take 1 tablet by mouth every morning.     No current facility-administered medications on file prior to visit.    No Known Allergies  Family History  Problem Relation Age of Onset  . Adopted: Yes    Social History   Social History  . Marital Status: Divorced    Spouse Name: N/A  . Number of Children: N/A  . Years of Education: N/A   Occupational History  . Not on file.   Social History Main Topics  . Smoking status: Never Smoker   . Smokeless tobacco: Not on file  . Alcohol Use: Yes     Comment: occasional  . Drug Use: No  . Sexual Activity: Not on file   Other  Topics Concern  . 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           Review of Systems  Constitutional: Negative.   HENT: Negative.   Eyes: Negative.   Respiratory: Negative.   Cardiovascular: Negative.   Gastrointestinal: Negative.   Genitourinary: Negative.   Musculoskeletal: Negative.   Neurological: Negative.     BP 110/64 mmHg  Temp(Src) 97.4 F (36.3 C) (Oral)  Ht 5\' 10"  (1.778 m)  Wt 202 lb 8 oz (91.853 kg)  BMI 29.06 kg/m2  Physical Exam  Constitutional: He is oriented to person, place, and time and well-developed, well-nourished, and in no distress. No distress.  HENT:  Head: Normocephalic and atraumatic.  Right Ear: External ear normal.  Left Ear: External ear normal.  Nose: Nose normal.  Mouth/Throat: Oropharynx is clear and moist. No oropharyngeal exudate.  Eyes: Conjunctivae and EOM are normal. Pupils are equal, round, and reactive to light. Right eye exhibits no discharge. Left eye exhibits no discharge.  Cardiovascular: Normal rate, regular rhythm, normal heart sounds  and intact distal pulses.  Exam reveals no friction rub.   No murmur heard. Pulmonary/Chest: Effort normal and breath sounds normal. No respiratory distress. He has no wheezes. He has no rales. He exhibits no tenderness.  Neurological: He is alert and oriented to person, place, and time. Gait normal. GCS score is 15.  Skin: Skin is warm and dry. No rash noted. He is not diaphoretic. No erythema. No pallor.  Psychiatric: Mood, memory, affect and judgment normal.  Nursing note and vitals reviewed.   Assessment/Plan: 1. Encounter to establish care - Follow up for ' Welcome to medicare visit'  - Follow up sooner if needed - Continue to exercise and eat healthy.   2. Colon cancer screening - Ambulatory referral to Gastroenterology  3. Varicose veins -  Ambulatory referral to Vascular Surgery - Pneumococcal conjugate vaccine 13-valent IM   [name], NP

## 2015-08-02 ENCOUNTER — Telehealth: Payer: Self-pay

## 2015-08-02 NOTE — Telephone Encounter (Signed)
Opened in error

## 2015-08-13 ENCOUNTER — Other Ambulatory Visit: Payer: Self-pay | Admitting: *Deleted

## 2015-08-13 DIAGNOSIS — I8390 Asymptomatic varicose veins of unspecified lower extremity: Secondary | ICD-10-CM

## 2015-08-19 ENCOUNTER — Encounter: Payer: Self-pay | Admitting: Gastroenterology

## 2015-09-24 ENCOUNTER — Encounter: Payer: Self-pay | Admitting: Adult Health

## 2015-09-24 ENCOUNTER — Ambulatory Visit (INDEPENDENT_AMBULATORY_CARE_PROVIDER_SITE_OTHER): Payer: Medicare Other | Admitting: Adult Health

## 2015-09-24 VITALS — BP 130/70 | HR 69 | Temp 97.7°F | Ht 70.0 in | Wt 207.2 lb

## 2015-09-24 DIAGNOSIS — M5412 Radiculopathy, cervical region: Secondary | ICD-10-CM

## 2015-09-24 MED ORDER — METHYLPREDNISOLONE 4 MG PO TBPK: ORAL_TABLET | ORAL | 0 refills | Status: DC

## 2015-09-24 MED ORDER — TIZANIDINE HCL 4 MG PO TABS: 4.0000 mg | ORAL_TABLET | Freq: Four times a day (QID) | ORAL | 0 refills | Status: DC | PRN

## 2015-09-24 NOTE — Progress Notes (Signed)
Pre visit review using our clinic review tool, if applicable. No additional management support is needed unless otherwise documented below in the visit note. 

## 2015-09-24 NOTE — Progress Notes (Signed)
Subjective:    Patient ID: Dean Thompson, male    DOB: 1950-08-30, 65 y.o.   MRN: [id number]  HPI  65 year old male who presents to the office today for left shoulder pain. He reports that about three weeks ago he was working out and when he woke up the next morning he had pain in his left shoulder that radiated down his deltoid. The pain is described as " someone poking me with small needles". He feels as though the pain starts in the back of the shoulder and radiates down his arm  He denies any loss of ROM or loss of strength in left hand.   He has not been using any OTC medications.   Review of Systems  Constitutional: Negative.   Respiratory: Negative.   Cardiovascular: Negative.   Musculoskeletal: Positive for myalgias. Negative for arthralgias, joint swelling, neck pain and neck stiffness.  Skin: Negative.   Neurological: Negative.   All other systems reviewed and are negative.  Past Medical History:  Diagnosis Date  . Allergy   . Rotator cuff tear    left    Social History   Social History  . Marital status: Divorced    Spouse name: N/A  . Number of children: N/A  . Years of education: N/A   Occupational History  . Not on file.   Social History Main Topics  . Smoking status: Never Smoker  . Smokeless tobacco: Not on file  . Alcohol use Yes     Comment: occasional  . Drug use: No  . Sexual activity: Not on file   Other Topics Concern  . 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           Past Surgical History:  Procedure Laterality Date  . CYSTECTOMY  2006   subacious cyst  remove from neck  . ROTATOR CUFF REPAIR  1999  . SEPTOPLASTY  1982  . SHOULDER OPEN ROTATOR CUFF REPAIR  10/01/2011   Procedure: ROTATOR CUFF REPAIR SHOULDER OPEN;  Surgeon: Magnus Sinning, MD;  Location: [facility];  Service: Orthopedics;   Laterality: Left;  Left Shoulder Acrominectomy/Open Rotator Cuff Repair/Distal Clavicle Resection    Family History  Problem Relation Age of Onset  . Adopted: Yes    No Known Allergies  Current Outpatient Prescriptions on File Prior to Visit  Medication Sig Dispense Refill  . Multiple Vitamin (MULTIVITAMIN WITH MINERALS) TABS Take 1 tablet by mouth every morning.     No current facility-administered medications on file prior to visit.     BP 130/70 (BP Location: Right Arm, Patient Position: Sitting, Cuff Size: Normal)   Pulse 69   Temp 97.7 F (36.5 C) (Oral)   Ht 5\' 10"  (1.778 m)   Wt 207 lb 3.2 oz (94 kg)   SpO2 97%   BMI 29.73 kg/m       Objective:   Physical Exam  Constitutional: He is oriented to person, place, and time. He appears well-developed and well-nourished. No distress.  Cardiovascular: Normal rate, regular rhythm, normal heart sounds and intact distal pulses.  Exam reveals no gallop and no friction rub.   No murmur heard. Pulmonary/Chest: Effort normal and breath sounds normal. No respiratory distress. He has no wheezes. He has no rales. He  exhibits no tenderness.  Musculoskeletal: Normal range of motion. He exhibits tenderness. He exhibits no edema.  Tenderness with palpation to left scapula  Neurological: He is alert and oriented to person, place, and time.  Skin: Skin is warm and dry. No rash noted. He is not diaphoretic. There is erythema.  Psychiatric: He has a normal mood and affect. His behavior is normal. Judgment and thought content normal.  Nursing note and vitals reviewed.     Assessment & Plan:  1. Cervical radiculopathy - tiZANidine (ZANAFLEX) 4 MG tablet; Take 1 tablet (4 mg total) by mouth every 6 (six) hours as needed for muscle spasms.  Dispense: 30 tablet; Refill: 0 - methylPREDNISolone (MEDROL DOSEPAK) 4 MG TBPK tablet; Take as directed  Dispense: 21 tablet; Refill: 0 - Motrin 600mg  Q8H x 3 days - Heating pad - Follow up if no  improvement  - Consider steroid injection or referral to sports medicine  [name], NP

## 2015-09-25 MED ORDER — TIZANIDINE HCL 4 MG PO TABS: 4.0000 mg | ORAL_TABLET | Freq: Four times a day (QID) | ORAL | 0 refills | Status: DC | PRN

## 2015-09-25 NOTE — Addendum Note (Signed)
Addended by: Apolinar Junes on: 09/25/2015 06:40 AM   Modules accepted: Orders

## 2015-09-26 ENCOUNTER — Other Ambulatory Visit: Payer: Self-pay | Admitting: Adult Health

## 2015-09-26 DIAGNOSIS — M5412 Radiculopathy, cervical region: Secondary | ICD-10-CM

## 2015-09-26 MED ORDER — TIZANIDINE HCL 4 MG PO TABS: 4.0000 mg | ORAL_TABLET | Freq: Four times a day (QID) | ORAL | 0 refills | Status: DC | PRN

## 2015-09-26 NOTE — Telephone Encounter (Signed)
Pt verified [facility] [address] in [place]

## 2015-09-26 NOTE — Telephone Encounter (Signed)
Rx sent to [facility] in [place].

## 2015-09-26 NOTE — Telephone Encounter (Signed)
I left message for patient to return phone call to verify which pharmacy. There are two [facility] on file in different states, but are [address]. I need to clarify which one.

## 2015-09-26 NOTE — Telephone Encounter (Signed)
Pt saw cory on  09-24-15 and received the prednisone and still waiting for muscle relaxer .[facility]  [address]

## 2015-10-09 ENCOUNTER — Encounter: Payer: Self-pay | Admitting: Adult Health

## 2015-10-09 ENCOUNTER — Ambulatory Visit (INDEPENDENT_AMBULATORY_CARE_PROVIDER_SITE_OTHER): Payer: Medicare Other | Admitting: Adult Health

## 2015-10-09 VITALS — BP 110/70 | HR 74 | Temp 97.8°F | Ht 70.0 in | Wt 198.2 lb

## 2015-10-09 DIAGNOSIS — Z0000 Encounter for general adult medical examination without abnormal findings: Secondary | ICD-10-CM

## 2015-10-09 DIAGNOSIS — Z711 Person with feared health complaint in whom no diagnosis is made: Secondary | ICD-10-CM | POA: Diagnosis not present

## 2015-10-09 DIAGNOSIS — R972 Elevated prostate specific antigen [PSA]: Secondary | ICD-10-CM

## 2015-10-09 DIAGNOSIS — M25512 Pain in left shoulder: Secondary | ICD-10-CM

## 2015-10-09 LAB — BASIC METABOLIC PANEL
BUN: 22 mg/dL (ref 6–23)
CHLORIDE: 107 meq/L (ref 96–112)
CO2: 26 meq/L (ref 19–32)
Calcium: 8.7 mg/dL (ref 8.4–10.5)
Creatinine, Ser: 1.33 mg/dL (ref 0.40–1.50)
GFR: 57.29 mL/min — ABNORMAL LOW (ref >60.00)
Glucose, Bld: 101 mg/dL — ABNORMAL HIGH (ref 70–99)
POTASSIUM: 4.1 meq/L (ref 3.5–5.1)
Sodium: 140 mEq/L (ref 135–145)

## 2015-10-09 LAB — CBC WITH DIFFERENTIAL/PLATELET
BASOS PCT: 0.3 % (ref 0.0–3.0)
Basophils Absolute: 0 10*3/uL (ref 0.0–0.1)
EOS PCT: 2.3 % (ref 0.0–5.0)
Eosinophils Absolute: 0.1 10*3/uL (ref 0.0–0.7)
HEMATOCRIT: 44.5 % (ref 39.0–52.0)
HEMOGLOBIN: 15.3 g/dL (ref 13.0–17.0)
LYMPHS PCT: 18.3 % (ref 12.0–46.0)
Lymphs Abs: 1.1 10*3/uL (ref 0.7–4.0)
MCHC: 34.4 g/dL (ref 30.0–36.0)
MCV: 90.9 fl (ref 78.0–100.0)
Monocytes Absolute: 0.4 10*3/uL (ref 0.1–1.0)
Monocytes Relative: 7.6 % (ref 3.0–12.0)
NEUTROS ABS: 4.1 10*3/uL (ref 1.4–7.7)
NEUTROS PCT: 71.5 % (ref 43.0–77.0)
PLATELETS: 131 10*3/uL — AB (ref 150.0–400.0)
RBC: 4.9 Mil/uL (ref 4.22–5.81)
RDW: 13.4 % (ref 11.5–15.5)
WBC: 5.8 10*3/uL (ref 4.0–10.5)

## 2015-10-09 LAB — POC URINALSYSI DIPSTICK (AUTOMATED)
BILIRUBIN UA: NEGATIVE
Blood, UA: NEGATIVE
Glucose, UA: NEGATIVE
Ketones, UA: NEGATIVE
LEUKOCYTES UA: NEGATIVE
NITRITE UA: NEGATIVE
PH UA: 5.5
PROTEIN UA: NEGATIVE
Spec Grav, UA: 1.02
Urobilinogen, UA: 0.2

## 2015-10-09 LAB — PSA: PSA: 1.61 ng/mL (ref 0.10–4.00)

## 2015-10-09 LAB — HEPATIC FUNCTION PANEL
ALT: 19 U/L (ref 0–53)
AST: 17 U/L (ref 0–37)
Albumin: 4.3 g/dL (ref 3.5–5.2)
Alkaline Phosphatase: 79 U/L (ref 39–117)
BILIRUBIN TOTAL: 1 mg/dL (ref 0.2–1.2)
Bilirubin, Direct: 0.2 mg/dL (ref 0.0–0.3)
Total Protein: 6.4 g/dL (ref 6.0–8.3)

## 2015-10-09 LAB — LIPID PANEL
CHOLESTEROL: 178 mg/dL (ref 0–200)
HDL: 35.8 mg/dL — ABNORMAL LOW (ref >39.00)
LDL CALC: 120 mg/dL — AB (ref 0–99)
NonHDL: 142.66
Total CHOL/HDL Ratio: 5
Triglycerides: 115 mg/dL (ref 0.0–149.0)
VLDL: 23 mg/dL (ref 0.0–40.0)

## 2015-10-09 LAB — HEMOGLOBIN A1C: HEMOGLOBIN A1C: 5.1 % (ref 4.6–6.5)

## 2015-10-09 LAB — TSH: TSH: 1.21 u[IU]/mL (ref 0.35–4.50)

## 2015-10-09 NOTE — Progress Notes (Signed)
Pre visit review using our clinic review tool, if applicable. No additional management support is needed unless otherwise documented below in the visit note. 

## 2015-10-09 NOTE — Progress Notes (Signed)
Subjective:    Patient ID: Dean Thompson, male    DOB: [birth year], 65 y.o.   MRN: [id number]  HPI  Patient presents for his welcome to medicare exam. He is a pleasant 65 year old male who  has a past medical history of Allergy and Rotator cuff tear.   Medicare paperwork reviewed   All immunizations and health maintenance protocols were reviewed with the patient and needed orders were placed.  Appropriate screening laboratory values were ordered for the patient including screening of hyperlipidemia, renal function and hepatic function. If indicated by BPH, a PSA was ordered.  Medication reconciliation,  past medical history, social history, problem list and allergies were reviewed in detail with the patient  Goals were established with regard to weight loss, exercise, and  diet in compliance with medications. He continues to eat healthy and exercises 3-4 days a week, he enjoys weight lifting and exercising.   End of life planning was discussed. He was given information on advanced directive and living will.   He has has colonoscopy scheduled for next month. His vision and dental exams are UTD  Although improvement in left shoulder pain with medrol dose pack and muscle relaxer, he continues to have pain. Has a history of bilateral rotator cuff surgery. He denies any trauma. Has full ROM.   Review of Systems  Constitutional: Negative.   HENT: Negative.   Eyes: Negative.   Respiratory: Negative.   Cardiovascular: Negative.   Gastrointestinal: Negative.   Endocrine: Negative.   Genitourinary: Negative.   Musculoskeletal: Negative.   Skin: Negative.   Allergic/Immunologic: Negative.   Neurological: Negative.   Hematological: Negative.   Psychiatric/Behavioral: Negative.   All other systems reviewed and are negative.  Past Medical History:  Diagnosis Date  . Allergy   . Rotator cuff tear    left    Social History   Social History  . Marital status: Divorced   Spouse name: N/A  . Number of children: N/A  . Years of education: N/A   Occupational History  . Not on file.   Social History Main Topics  . Smoking status: Never Smoker  . Smokeless tobacco: Not on file  . Alcohol use Yes     Comment: occasional  . Drug use: No  . Sexual activity: Not on file   Other Topics Concern  . 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           Past Surgical History:  Procedure Laterality Date  . CYSTECTOMY  2006   subacious cyst  remove from neck  . ROTATOR CUFF REPAIR  1999  . SEPTOPLASTY  1982  . SHOULDER OPEN ROTATOR CUFF REPAIR  10/01/2011   Procedure: ROTATOR CUFF REPAIR SHOULDER OPEN;  Surgeon: Magnus Sinning, MD;  Location: [facility];  Service: Orthopedics;  Laterality: Left;  Left Shoulder Acrominectomy/Open Rotator Cuff Repair/Distal Clavicle Resection    Family History  Problem Relation Age of Onset  . Adopted: Yes    No Known Allergies  Current Outpatient Prescriptions on File Prior to Visit  Medication Sig Dispense Refill  . methylPREDNISolone (MEDROL DOSEPAK) 4 MG TBPK tablet Take as directed 21 tablet 0  . Multiple Vitamin (MULTIVITAMIN WITH MINERALS) TABS Take 1 tablet by mouth every morning.    Marland Kitchen  tiZANidine (ZANAFLEX) 4 MG tablet Take 1 tablet (4 mg total) by mouth every 6 (six) hours as needed for muscle spasms. 30 tablet 0   No current facility-administered medications on file prior to visit.     BP 110/70 (BP Location: Left Arm, Patient Position: Sitting, Cuff Size: Normal)   Pulse 74   Temp 97.8 F (36.6 C) (Oral)   Ht 5\' 10"  (1.778 m)   Wt 198 lb 3.2 oz (89.9 kg)   SpO2 97%   BMI 28.44 kg/m       Objective:   Physical Exam  Constitutional: He is oriented to person, place, and time. He appears well-developed and well-nourished. No distress.  HENT:  Head: Normocephalic and  atraumatic.  Right Ear: Hearing, tympanic membrane, external ear and ear canal normal.  Left Ear: Hearing, tympanic membrane, external ear and ear canal normal.  Nose: Nose normal.  Mouth/Throat: Uvula is midline, oropharynx is clear and moist and mucous membranes are normal. Dental caries present. No oropharyngeal exudate, posterior oropharyngeal edema, posterior oropharyngeal erythema or tonsillar abscesses.  Eyes: Conjunctivae are normal. Pupils are equal, round, and reactive to light. Right eye exhibits no discharge. Left eye exhibits no discharge. No scleral icterus.  Neck: Neck supple. No JVD present. Carotid bruit is not present. No tracheal deviation present. No thyromegaly present.  Cardiovascular: Normal rate, regular rhythm, normal heart sounds and intact distal pulses.  Exam reveals no gallop and no friction rub.   No murmur heard. Pulmonary/Chest: Effort normal and breath sounds normal. No respiratory distress. He has no wheezes. He has no rales. He exhibits no tenderness.  Abdominal: Soft. Bowel sounds are normal. He exhibits no distension and no mass. There is no tenderness. There is no rebound and no guarding.  Genitourinary: Penis normal. Rectal exam shows external hemorrhoid. Rectal exam shows guaiac negative stool. Prostate is enlarged. Prostate is not tender. No penile tenderness.  Musculoskeletal: Normal range of motion. He exhibits no edema, tenderness or deformity.  Lymphadenopathy:    He has no cervical adenopathy.  Neurological: He is alert and oriented to person, place, and time. He has normal reflexes. No cranial nerve deficit. He exhibits normal muscle tone. Coordination normal.  Skin: Skin is warm and dry. No rash noted. He is not diaphoretic. No erythema. No pallor.  Psychiatric: He has a normal mood and affect. His behavior is normal. Judgment and thought content normal.  Nursing note and vitals reviewed.     Assessment & Plan:  1. Welcome to Medicare preventive  visit - POCT Urinalysis Dipstick (Automated) - Basic metabolic panel - CBC with Differential/Platelet - Lipid panel - TSH - Hepatic function panel - EKG 12-Lead - NSR, Rate 60 - Hemoglobin A1c - Continue to stay healthy throughou diet and exercise - Follow up in one year or sooner if needed  2. Concern about diabetes mellitus without diagnosis - POCT Urinalysis Dipstick (Automated) - Basic metabolic panel - CBC with Differential/Platelet - Lipid panel - TSH - Hepatic function panel - EKG 12-Lead - Hemoglobin A1c  3. Left shoulder pain - DG Shoulder Left; Future - Consider steroid injection or referral to sports medicine  [name], NP

## 2015-10-09 NOTE — Patient Instructions (Addendum)
It was great seeing you today!  I will follow up with you regarding your blood work   Your EKG was normal   Please go to the [place] office and have your x ray done  Of course, continue to eat healthy and exercise.   Follow up in one year for your next medicare wellness exam   Health Maintenance, Male A healthy lifestyle and preventative care can promote health and wellness.  Maintain regular health, dental, and eye exams.  Eat a healthy diet. Foods like vegetables, fruits, whole grains, low-fat dairy products, and lean protein foods contain the nutrients you need and are low in calories. Decrease your intake of foods high in solid fats, added sugars, and salt. Get information about a proper diet from your health care provider, if necessary.  Regular physical exercise is one of the most important things you can do for your health. Most adults should get at least 150 minutes of moderate-intensity exercise (any activity that increases your heart rate and causes you to sweat) each week. In addition, most adults need muscle-strengthening exercises on 2 or more days a week.   Maintain a healthy weight. The body mass index (BMI) is a screening tool to identify possible weight problems. It provides an estimate of body fat based on height and weight. Your health care provider can find your BMI and can help you achieve or maintain a healthy weight. For males 20 years and older:  A BMI below 18.5 is considered underweight.  A BMI of 18.5 to 24.9 is normal.  A BMI of 25 to 29.9 is considered overweight.  A BMI of 30 and above is considered obese.  Maintain normal blood lipids and cholesterol by exercising and minimizing your intake of saturated fat. Eat a balanced diet with plenty of fruits and vegetables. Blood tests for lipids and cholesterol should begin at age 16 and be repeated every 5 years. If your lipid or cholesterol levels are high, you are over age 41, or you are at high risk for heart  disease, you may need your cholesterol levels checked more frequently.Ongoing high lipid and cholesterol levels should be treated with medicines if diet and exercise are not working.  If you smoke, find out from your health care provider how to quit. If you do not use tobacco, do not start.  Lung cancer screening is recommended for adults aged 63-80 years who are at high risk for developing lung cancer because of a history of smoking. A yearly low-dose CT scan of the lungs is recommended for people who have at least a 30-pack-year history of smoking and are current smokers or have quit within the past 15 years. A pack year of smoking is smoking an average of 1 pack of cigarettes a day for 1 year (for example, a 30-pack-year history of smoking could mean smoking 1 pack a day for 30 years or 2 packs a day for 15 years). Yearly screening should continue until the smoker has stopped smoking for at least 15 years. Yearly screening should be stopped for people who develop a health problem that would prevent them from having lung cancer treatment.  If you choose to drink alcohol, do not have more than 2 drinks per day. One drink is considered to be 12 oz (360 mL) of beer, 5 oz (150 mL) of wine, or 1.5 oz (45 mL) of liquor.  Avoid the use of street drugs. Do not share needles with anyone. Ask for help if you need  support or instructions about stopping the use of drugs.  High blood pressure causes heart disease and increases the risk of stroke. High blood pressure is more likely to develop in:  People who have blood pressure in the end of the normal range (100-139/85-89 mm Hg).  People who are overweight or obese.  People who are African American.  If you are 76-65 years of age, have your blood pressure checked every 3-5 years. If you are 7 years of age or older, have your blood pressure checked every year. You should have your blood pressure measured twice--once when you are at a hospital or clinic, and  once when you are not at a hospital or clinic. Record the average of the two measurements. To check your blood pressure when you are not at a hospital or clinic, you can use:  An automated blood pressure machine at a pharmacy.  A home blood pressure monitor.  If you are 37-67 years old, ask your health care provider if you should take aspirin to prevent heart disease.  Diabetes screening involves taking a blood sample to check your fasting blood sugar level. This should be done once every 3 years after age 18 if you are at a normal weight and without risk factors for diabetes. Testing should be considered at a younger age or be carried out more frequently if you are overweight and have at least 1 risk factor for diabetes.  Colorectal cancer can be detected and often prevented. Most routine colorectal cancer screening begins at the age of 66 and continues through age 1. However, your health care provider may recommend screening at an earlier age if you have risk factors for colon cancer. On a yearly basis, your health care provider may provide home test kits to check for hidden blood in the stool. A small camera at the end of a tube may be used to directly examine the colon (sigmoidoscopy or colonoscopy) to detect the earliest forms of colorectal cancer. Talk to your health care provider about this at age 52 when routine screening begins. A direct exam of the colon should be repeated every 5-10 years through age 56, unless early forms of precancerous polyps or small growths are found.  People who are at an increased risk for hepatitis B should be screened for this virus. You are considered at high risk for hepatitis B if:  You were born in a country where hepatitis B occurs often. Talk with your health care provider about which countries are considered high risk.  Your parents were born in a high-risk country and you have not received a shot to protect against hepatitis B (hepatitis B  vaccine).  You have HIV or AIDS.  You use needles to inject street drugs.  You live with, or have sex with, someone who has hepatitis B.  You are a man who has sex with other men (MSM).  You get hemodialysis treatment.  You take certain medicines for conditions like cancer, organ transplantation, and autoimmune conditions.  Hepatitis C blood testing is recommended for all people born from [birth year] through 1965 and any individual with known risk factors for hepatitis C.  Healthy men should no longer receive prostate-specific antigen (PSA) blood tests as part of routine cancer screening. Talk to your health care provider about prostate cancer screening.  Testicular cancer screening is not recommended for adolescents or adult males who have no symptoms. Screening includes self-exam, a health care provider exam, and other screening tests. Consult with your  health care provider about any symptoms you have or any concerns you have about testicular cancer.  Practice safe sex. Use condoms and avoid high-risk sexual practices to reduce the spread of sexually transmitted infections (STIs).  You should be screened for STIs, including gonorrhea and chlamydia if:  You are sexually active and are younger than 24 years.  You are older than 24 years, and your health care provider tells you that you are at risk for this type of infection.  Your sexual activity has changed since you were last screened, and you are at an increased risk for chlamydia or gonorrhea. Ask your health care provider if you are at risk.  If you are at risk of being infected with HIV, it is recommended that you take a prescription medicine daily to prevent HIV infection. This is called pre-exposure prophylaxis (PrEP). You are considered at risk if:  You are a man who has sex with other men (MSM).  You are a heterosexual man who is sexually active with multiple partners.  You take drugs by injection.  You are sexually active  with a partner who has HIV.  Talk with your health care provider about whether you are at high risk of being infected with HIV. If you choose to begin PrEP, you should first be tested for HIV. You should then be tested every 3 months for as long as you are taking PrEP.  Use sunscreen. Apply sunscreen liberally and repeatedly throughout the day. You should seek shade when your shadow is shorter than you. Protect yourself by wearing long sleeves, pants, a wide-brimmed hat, and sunglasses year round whenever you are outdoors.  Tell your health care provider of new moles or changes in moles, especially if there is a change in shape or color. Also, tell your health care provider if a mole is larger than the size of a pencil eraser.  A one-time screening for abdominal aortic aneurysm (AAA) and surgical repair of large AAAs by ultrasound is recommended for men aged 65-75 years who are current or former smokers.  Stay current with your vaccines (immunizations).   This information is not intended to replace advice given to you by your health care provider. Make sure you discuss any questions you have with your health care provider.   Document Released: 08/01/2007 Document Revised: 02/23/2014 Document Reviewed: 06/30/2010 Elsevier Interactive Patient Education [employer].

## 2015-10-24 ENCOUNTER — Ambulatory Visit (INDEPENDENT_AMBULATORY_CARE_PROVIDER_SITE_OTHER): Payer: Medicare Other | Admitting: Adult Health

## 2015-10-24 ENCOUNTER — Ambulatory Visit (INDEPENDENT_AMBULATORY_CARE_PROVIDER_SITE_OTHER)
Admission: RE | Admit: 2015-10-24 | Discharge: 2015-10-24 | Disposition: A | Discharge status: Home / Self Care | Payer: Medicare Other | Source: Ambulatory Visit | Attending: Adult Health | Admitting: Adult Health

## 2015-10-24 VITALS — BP 122/74 | Temp 98.2°F | Ht 70.0 in | Wt 193.0 lb

## 2015-10-24 DIAGNOSIS — R202 Paresthesia of skin: Secondary | ICD-10-CM

## 2015-10-24 DIAGNOSIS — M25512 Pain in left shoulder: Secondary | ICD-10-CM

## 2015-10-24 DIAGNOSIS — R200 Anesthesia of skin: Secondary | ICD-10-CM

## 2015-10-24 DIAGNOSIS — M5412 Radiculopathy, cervical region: Secondary | ICD-10-CM | POA: Diagnosis not present

## 2015-10-24 DIAGNOSIS — M19012 Primary osteoarthritis, left shoulder: Secondary | ICD-10-CM | POA: Diagnosis not present

## 2015-10-24 DIAGNOSIS — M47812 Spondylosis without myelopathy or radiculopathy, cervical region: Secondary | ICD-10-CM | POA: Diagnosis not present

## 2015-10-24 NOTE — Progress Notes (Signed)
Subjective:    Patient ID: Dean Thompson, male    DOB: 25-Nov-1950, 65 y.o.   MRN: [id number]  HPI   Dean Thompson is a 65 year old male who presents to the office today today for the complaint of numbness and tingling in his left arm. He reports that about 6-7 times a day, for 30-45 seconds at a time his entire left arm will become numb and tingle. This sensation goes from his left shoulder down to his fingers. " It feels like my arm goes to sleep for 45 seconds and then the sensation is gone." These symptoms have been present for 2 weeks but have been becoming increasingly more common.   He denies any decrease in over all arm strength and has not noticed any decrease in grip strength. He denies any slurred speech, facial droop, chest pain, or shortness of breath.  He denies any trauma to the area.    Review of Systems  Constitutional: Negative.   HENT: Negative.   Respiratory: Negative.   Cardiovascular: Negative.   Gastrointestinal: Negative.   Musculoskeletal: Negative.   Neurological: Positive for numbness. Negative for dizziness, tremors, syncope, facial asymmetry, weakness and headaches.  Hematological: Negative.   All other systems reviewed and are negative.  Past Medical History:  Diagnosis Date  . Allergy   . Rotator cuff tear    left    Social History   Social History  . Marital status: Divorced    Spouse name: N/A  . Number of children: N/A  . Years of education: N/A   Occupational History  . Not on file.   Social History Main Topics  . Smoking status: Never Smoker  . Smokeless tobacco: Not on file  . Alcohol use Yes     Comment: occasional  . Drug use: No  . Sexual activity: Not on file   Other Topics Concern  . Not on file   Social History Narrative   Real Dean Thompson - owner    Divorced   Three daughters - One lives in [place], [place],  [place], [place]       He likes to golf.           Past Surgical History:  Procedure Laterality Date  .  CYSTECTOMY  2006   subacious cyst  remove from neck  . ROTATOR CUFF REPAIR  1999  . SEPTOPLASTY  1982  . SHOULDER OPEN ROTATOR CUFF REPAIR  10/01/2011   Procedure: ROTATOR CUFF REPAIR SHOULDER OPEN;  Surgeon: Magnus Sinning, MD;  Location: [facility];  Service: Orthopedics;  Laterality: Left;  Left Shoulder Acrominectomy/Open Rotator Cuff Repair/Distal Clavicle Resection    Family History  Problem Relation Age of Onset  . Adopted: Yes    No Known Allergies  Current Outpatient Prescriptions on File Prior to Visit  Medication Sig Dispense Refill  . Multiple Vitamin (MULTIVITAMIN WITH MINERALS) TABS Take 1 tablet by mouth every morning.     No current facility-administered medications on file prior to visit.     BP 122/74   Temp 98.2 F (36.8 C) (Oral)   Ht 5\' 10"  (1.778 m)   Wt 193 lb (87.5 kg)   BMI 27.69 kg/m       Objective:   Physical Exam  Constitutional: He is oriented to person, place, and time. He appears well-developed and well-nourished. No distress.  Cardiovascular: Normal rate, regular rhythm, normal heart sounds and intact distal pulses.  Exam reveals no gallop and no friction rub.  No murmur heard. Pulmonary/Chest: Effort normal and breath sounds normal. No respiratory distress. He has no wheezes. He has no rales. He exhibits no tenderness.  Musculoskeletal: Normal range of motion. He exhibits no edema, tenderness or deformity.  Neurological: He is alert and oriented to person, place, and time. He has normal reflexes. He displays normal reflexes. No cranial nerve deficit. He exhibits normal muscle tone. Coordination normal.  He had two of these episodes in the office today. His grip strength was slightly diminished during these episodes but had returned after 30 seconds. He had no loss of ROM. Had no pain with palpation.  There were no signs of CVA  Skin: Skin is warm and dry. No rash noted. He is not diaphoretic. No erythema. No pallor.  Psychiatric: He has a  normal mood and affect. His behavior is normal. Judgment and thought content normal.  Nursing note and vitals reviewed.     Assessment & Plan:   1. Numbness and tingling in left arm - Appears to be radiculopathy. No concern for CVA.  - DG Left Shoulder - DG Cervical Spine Complete; Future - Consider referral to Neurology for possible nerve conduction or Orthopedic surgery   [name], NP

## 2015-10-24 NOTE — Patient Instructions (Addendum)
I will gave you a call when I get the results back from the x rays.   Call [facility] if you do not hear anything by tomorrow about the pre treatment for the colonoscopy   Address: [address], [place], [place] [address] Hours: Open today  8AM-5PM Phone: [phone] Suggest an edit

## 2015-10-25 ENCOUNTER — Telehealth: Payer: Self-pay | Admitting: Adult Health

## 2015-10-25 ENCOUNTER — Other Ambulatory Visit: Payer: Self-pay | Admitting: Adult Health

## 2015-10-25 DIAGNOSIS — M4802 Spinal stenosis, cervical region: Secondary | ICD-10-CM

## 2015-10-25 NOTE — Telephone Encounter (Signed)
Pt returned your call.  

## 2015-10-25 NOTE — Telephone Encounter (Signed)
Updated Dean Thompson on his x ray results. He has been referred to orthopedic surgery for spinal stenosis

## 2015-10-28 ENCOUNTER — Ambulatory Visit (AMBULATORY_SURGERY_CENTER): Payer: Medicare Other | Admitting: *Deleted

## 2015-10-28 VITALS — Ht 70.0 in | Wt 200.0 lb

## 2015-10-28 DIAGNOSIS — Z1211 Encounter for screening for malignant neoplasm of colon: Secondary | ICD-10-CM

## 2015-10-28 MED ORDER — NA SULFATE-K SULFATE-MG SULF 17.5-3.13-1.6 GM/177ML PO SOLN: 1.0000 | Freq: Once | ORAL | 0 refills | Status: AC

## 2015-10-28 NOTE — Progress Notes (Signed)
No egg or soy allergy known to patient  No issues with past sedation with any surgeries  or procedures, no intubation problems  No diet pills per patient No home 02 use per patient  No blood thinners per patient  Pt denies issues with constipation  No A fib or A flutter   

## 2015-11-11 ENCOUNTER — Ambulatory Visit (AMBULATORY_SURGERY_CENTER): Payer: Medicare Other | Admitting: Gastroenterology

## 2015-11-11 ENCOUNTER — Encounter: Payer: Self-pay | Admitting: Gastroenterology

## 2015-11-11 VITALS — BP 105/65 | HR 54 | Temp 96.8°F | Resp 12 | Ht 70.0 in | Wt 200.0 lb

## 2015-11-11 DIAGNOSIS — Z1211 Encounter for screening for malignant neoplasm of colon: Secondary | ICD-10-CM | POA: Diagnosis present

## 2015-11-11 DIAGNOSIS — D120 Benign neoplasm of cecum: Secondary | ICD-10-CM | POA: Diagnosis not present

## 2015-11-11 MED ORDER — SODIUM CHLORIDE 0.9 % IV SOLN
500.0000 mL | INTRAVENOUS | Status: DC
Start: 2015-11-11 — End: 2017-07-28

## 2015-11-11 NOTE — Op Note (Signed)
[facility] Patient Name: Dean Thompson Procedure Date: 11/11/2015 9:15 AM MRN: [id number] Endoscopist: Mallie Mussel L. Loletha Carrow , MD Age: 65 Referring MD:  Date of Birth: 1950/04/20 Gender: Male Account #: [id number] Procedure:                Colonoscopy Indications:              Screening for colorectal malignant neoplasm Medicines:                Monitored Anesthesia Care Procedure:                Pre-Anesthesia Assessment:                           - Prior to the procedure, a History and Physical                            was performed, and patient medications and                            allergies were reviewed. The patient's tolerance of                            previous anesthesia was also reviewed. The risks                            and benefits of the procedure and the sedation                            options and risks were discussed with the patient.                            All questions were answered, and informed consent                            was obtained. Prior Anticoagulants: The patient has                            taken no previous anticoagulant or antiplatelet                            agents. ASA Grade Assessment: II - A patient with                            mild systemic disease. After reviewing the risks                            and benefits, the patient was deemed in                            satisfactory condition to undergo the procedure.                           After obtaining informed consent, the colonoscope  was passed under direct vision. Throughout the                            procedure, the patient's blood pressure, pulse, and                            oxygen saturations were monitored continuously. The                            Model CF-HQ190L [id number]) scope was introduced                            through the anus and advanced to the the cecum,                            identified by  appendiceal orifice and ileocecal                            valve. The colonoscopy was performed without                            difficulty. The patient tolerated the procedure                            well. The quality of the bowel preparation was                            good. The ileocecal valve, appendiceal orifice, and                            rectum were photographed. Scope In: 9:23:19 AM Scope Out: 9:35:13 AM Scope Withdrawal Time: 0 hours 9 minutes 34 seconds  Total Procedure Duration: 0 hours 11 minutes 54 seconds  Findings:                 The perianal and digital rectal examinations were                            normal.                           A 4 mm polyp was found in the cecum. The polyp was                            sessile. The polyp was removed with a cold snare.                            Resection and retrieval were complete.                           The exam was otherwise without abnormality on                            direct and retroflexion views. Complications:  No immediate complications. Estimated Blood Loss:     Estimated blood loss: none. Impression:               - One 4 mm polyp in the cecum, removed with a cold                            snare. Resected and retrieved.                           - The examination was otherwise normal on direct                            and retroflexion views. Recommendation:           - Patient has a contact number available for                            emergencies. The signs and symptoms of potential                            delayed complications were discussed with the                            patient. Return to normal activities tomorrow.                            Written discharge instructions were provided to the                            patient.                           - Resume previous diet.                           - Continue present medications.                           - No  aspirin, ibuprofen, naproxen, or other                            non-steroidal anti-inflammatory drugs for 5 days                            after polyp removal.                           - Await pathology results.                           - Repeat colonoscopy is recommended for                            surveillance. The colonoscopy date will be                            determined after pathology results  from today's                            exam become available for review. Henry L. Loletha Carrow, MD 11/11/2015 9:38:35 AM This report has been signed electronically.

## 2015-11-11 NOTE — Progress Notes (Signed)
A/ox3, pleased with MAC, report to RN 

## 2015-11-11 NOTE — Patient Instructions (Signed)
YOU HAD AN ENDOSCOPIC PROCEDURE TODAY AT [facility]:   Refer to the procedure report that was given to you for any specific questions about what was found during the examination.  If the procedure report does not answer your questions, please call your gastroenterologist to clarify.  If you requested that your care partner not be given the details of your procedure findings, then the procedure report has been included in a sealed envelope for you to review at your convenience later.  YOU SHOULD EXPECT: Some feelings of bloating in the abdomen. Passage of more gas than usual.  Walking can help get rid of the air that was put into your GI tract during the procedure and reduce the bloating. If you had a lower endoscopy (such as a colonoscopy or flexible sigmoidoscopy) you may notice spotting of blood in your stool or on the toilet paper. If you underwent a bowel prep for your procedure, you may not have a normal bowel movement for a few days.  Please Note:  You might notice some irritation and congestion in your nose or some drainage.  This is from the oxygen used during your procedure.  There is no need for concern and it should clear up in a day or so.  SYMPTOMS TO REPORT IMMEDIATELY:   Following lower endoscopy (colonoscopy or flexible sigmoidoscopy):  Excessive amounts of blood in the stool  Significant tenderness or worsening of abdominal pains  Swelling of the abdomen that is new, acute  Fever of 100F or higher   For urgent or emergent issues, a gastroenterologist can be reached at any hour by calling [phone].   DIET:  We do recommend a small meal at first, but then you may proceed to your regular diet.  Drink plenty of fluids but you should avoid alcoholic beverages for 24 hours.  ACTIVITY:  You should plan to take it easy for the rest of today and you should NOT DRIVE or use heavy machinery until tomorrow (because of the sedation medicines used during the test).     FOLLOW UP: Our staff will call the number listed on your records the next business day following your procedure to check on you and address any questions or concerns that you may have regarding the information given to you following your procedure. If we do not reach you, we will leave a message.  However, if you are feeling well and you are not experiencing any problems, there is no need to return our call.  We will assume that you have returned to your regular daily activities without incident.  If any biopsies were taken you will be contacted by phone or by letter within the next 1-3 weeks.  Please call us at [phone] if you have not heard about the biopsies in 3 weeks.    SIGNATURES/CONFIDENTIALITY: You and/or your care partner have signed paperwork which will be entered into your electronic medical record.  These signatures attest to the fact that that the information above on your After Visit Summary has been reviewed and is understood.  Full responsibility of the confidentiality of this discharge information lies with you and/or your care-partner.  Polyp-handout given  Repeat colonoscopy will be determined by pathology.  No aspirin, aspirin products or NSAIDs (ibuprofen, motrin, naproxen, aleve, etc.)  for 5 days.

## 2015-11-11 NOTE — Progress Notes (Signed)
Called to room to assist during endoscopic procedure.  Patient ID and intended procedure confirmed with present staff. Received instructions for my participation in the procedure from the performing physician.  

## 2015-11-12 ENCOUNTER — Telehealth: Payer: Self-pay

## 2015-11-12 NOTE — Telephone Encounter (Signed)
  Follow up Call-  Call back number 11/11/2015  Post procedure Call Back phone  # [phone]  Permission to leave phone message Yes  Some recent data might be hidden     Patient questions:  Do you have a fever, pain , or abdominal swelling? No. Pain Score  0 *  Have you tolerated food without any problems? Yes.    Have you been able to return to your normal activities? Yes.    Do you have any questions about your discharge instructions: Diet   No. Medications  No. Follow up visit  No.  Do you have questions or concerns about your Care? No.  Actions: * If pain score is 4 or above: No action needed, pain <4.

## 2015-11-13 ENCOUNTER — Encounter: Payer: Self-pay | Admitting: Adult Health

## 2015-11-13 ENCOUNTER — Ambulatory Visit (INDEPENDENT_AMBULATORY_CARE_PROVIDER_SITE_OTHER): Payer: Medicare Other | Admitting: Adult Health

## 2015-11-13 VITALS — BP 104/62 | Ht 70.0 in | Wt 192.7 lb

## 2015-11-13 DIAGNOSIS — I781 Nevus, non-neoplastic: Secondary | ICD-10-CM

## 2015-11-13 DIAGNOSIS — D1801 Hemangioma of skin and subcutaneous tissue: Secondary | ICD-10-CM

## 2015-11-13 NOTE — Patient Instructions (Addendum)
It was great seeing you today  We removed a cherry angioma. These are benign, so I will not send it for analysis.   After 24 hours it is ok to shower. Wash as you normally would.   If the site bleeds, hold pressure until the bleeding has stopped.   Tylenol for discomfort as needed  [place] angiomas are noncancerous (benign) skin growths. They are made up of a clump of blood vessels. They occur most often in people over the age of 59. CAUSES  The cause of these skin growths is unknown, but they appear to run in families. SYMPTOMS  Cherry angiomas are smooth, round, red bumps on the skin. They can be as small as a pinhead or as big as a pencil eraser. The color may darken to a purplish red over time. Cherry angiomas are usually found on the trunk, but they can occur anywhere on the body. They are painless, but they may bleed if they are injured. The bleeding is not serious and will stop when firm pressure is applied. DIAGNOSIS  Your health care provider can usually tell what is wrong by performing a physical exam. A tissue sample (biopsy) may also be taken and examined under a microscope. TREATMENT  Usually no treatment is needed for cherry angiomas. They may be removed for improved appearance (cosmetic) reasons. Sometimes, cherry angiomas come back after removal. Removal methods include:  Electrocautery. Heat is used to burn the growth off the skin.  Cryosurgery. Liquid nitrogen is applied to the growth to freeze it. The growth eventually falls off the skin.  Surgery. HOME CARE INSTRUCTIONS  If your skin was covered with a bandage, change and remove the bandage as directed by your health care provider.  Check your skin regularly for any changes. SEEK MEDICAL CARE IF: You notice any changes or new growths on your skin.   This information is not intended to replace advice given to you by your health care provider. Make sure you discuss any questions you have with your  health care provider.   Document Released: 04/13/2001 Document Revised: 02/23/2014 Document Reviewed: 03/13/2011 Elsevier Interactive Patient Education [employer].

## 2015-11-13 NOTE — Progress Notes (Signed)
Subjective:    Patient ID: Dean Thompson, male    DOB: 1950/07/18, 65 y.o.   MRN: [id number]  HPI  65 year old male who presents to the office today for removal of a " mole" behind my right ear. He would like it removed due to irritation when he wears glasses.   Denies any pain or drainage from the "mole". It has not increased in size   Review of Systems  Constitutional: Negative.   Respiratory: Negative.   Cardiovascular: Negative.   Skin: Negative.        "mole"  Neurological: Negative.   All other systems reviewed and are negative.  Past Medical History:  Diagnosis Date  . Allergy   . Rotator cuff tear    left    Social History   Social History  . Marital status: Divorced    Spouse name: N/A  . Number of children: N/A  . Years of education: N/A   Occupational History  . Not on file.   Social History Main Topics  . Smoking status: Never Smoker  . Smokeless tobacco: Never Used  . Alcohol use Yes     Comment: occasional  . Drug use: No  . Sexual activity: Not on file   Other Topics Concern  . 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           Past Surgical History:  Procedure Laterality Date  . COLONOSCOPY  2004   in [place] and was normal per pt  . CYSTECTOMY  2006   subacious cyst  remove from neck  . ROTATOR CUFF REPAIR  1999  . SEPTOPLASTY  1982  . SHOULDER OPEN ROTATOR CUFF REPAIR  10/01/2011   Procedure: ROTATOR CUFF REPAIR SHOULDER OPEN;  Surgeon: Magnus Sinning, MD;  Location: [facility];  Service: Orthopedics;  Laterality: Left;  Left Shoulder Acrominectomy/Open Rotator Cuff Repair/Distal Clavicle Resection    Family History  Problem Relation Age of Onset  . Adopted: Yes  . Colon cancer Neg Hx   . Colon polyps Neg Hx   . Rectal cancer Neg Hx   . Stomach cancer Neg Hx     No Known Allergies  Current  Outpatient Prescriptions on File Prior to Visit  Medication Sig Dispense Refill  . Multiple Vitamin (MULTIVITAMIN WITH MINERALS) TABS Take 1 tablet by mouth every morning.     Current Facility-Administered Medications on File Prior to Visit  Medication Dose Route Frequency Provider Last Rate Last Dose  . 0.9 %  sodium chloride infusion  500 mL Intravenous Continuous Nelida Meuse III, MD        BP 104/62   Ht 5\' 10"  (1.778 m)   Wt 192 lb 11.2 oz (87.4 kg)   BMI 27.65 kg/m        Objective:   Physical Exam  Constitutional: He is oriented to person, place, and time. He appears well-developed and well-nourished. No distress.  Neurological: He is alert and oriented to person, place, and time.  Skin: Skin is warm and dry. No rash noted. He is not diaphoretic. No erythema. No pallor.  0.3cm x 0.3 cm cherry angioma on scalp in back of right ear.   Psychiatric: He has a normal mood and affect. His behavior is normal. Judgment  and thought content normal.  Nursing note and vitals reviewed.     Assessment & Plan:  1. Cherry angioma Procedure including risks/benefits explained to patient.  Questions were answered. After informed consent was obtained and a time out completed, site was cleansed with betadine and then alcohol. 1% Lidocaine with epinephrine was injected under lesion and then shave biopsy was performed. Area was cauterized to obtain hemostasis.  Pt tolerated procedure well.  Specimen was not  sent for pathology review.  Pt instructed to keep the area dry for 24 hours and to contact us if he develops redness, drainage or swelling at the site.  Pt may use tylenol as needed for discomfort today.  - Follow up as needed  [name], NP

## 2015-11-15 ENCOUNTER — Encounter: Payer: Self-pay | Admitting: Gastroenterology

## 2015-11-22 ENCOUNTER — Encounter: Payer: Self-pay | Admitting: Vascular Surgery

## 2015-11-27 ENCOUNTER — Encounter (HOSPITAL_COMMUNITY): Payer: Medicare Other

## 2015-11-27 ENCOUNTER — Encounter: Payer: Medicare Other | Admitting: Vascular Surgery

## 2016-02-17 HISTORY — PX: SPINE SURGERY: SHX786

## 2016-04-17 ENCOUNTER — Encounter: Payer: Self-pay | Admitting: Vascular Surgery

## 2016-04-29 ENCOUNTER — Ambulatory Visit (HOSPITAL_COMMUNITY)
Admission: RE | Admit: 2016-04-29 | Discharge: 2016-04-29 | Disposition: A | Discharge status: Home / Self Care | Payer: Medicare Other | Source: Ambulatory Visit | Attending: Vascular Surgery | Admitting: Vascular Surgery

## 2016-04-29 ENCOUNTER — Ambulatory Visit (INDEPENDENT_AMBULATORY_CARE_PROVIDER_SITE_OTHER): Payer: Medicare Other | Admitting: Vascular Surgery

## 2016-04-29 ENCOUNTER — Encounter: Payer: Self-pay | Admitting: Vascular Surgery

## 2016-04-29 VITALS — BP 118/75 | HR 68 | Temp 97.2°F | Resp 18 | Ht 70.0 in | Wt 197.4 lb

## 2016-04-29 DIAGNOSIS — I8392 Asymptomatic varicose veins of left lower extremity: Secondary | ICD-10-CM | POA: Diagnosis not present

## 2016-04-29 DIAGNOSIS — I872 Venous insufficiency (chronic) (peripheral): Secondary | ICD-10-CM

## 2016-04-29 DIAGNOSIS — I8390 Asymptomatic varicose veins of unspecified lower extremity: Secondary | ICD-10-CM | POA: Diagnosis not present

## 2016-04-29 NOTE — Progress Notes (Signed)
Patient name: Dean Thompson MRN: [id number] DOB: [birth year] Sex: male  REASON FOR CONSULT: Varicose veins left leg. Referred by [name], NP.  HPI: Dean Thompson is a 66 y.o. 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 He experiences significant aching pain and throbbing in his left leg which is brought on by ambulation and relieved somewhat with elevation. His symptoms have been gradually progressive. He is unaware of any history of DVT or phlebitis. There is no family history of varicose veins that he is aware of. There are no other aggravating or alleviating factors. He has not worn compression stockings.  Past Medical History:  Diagnosis Date  . Allergy   . Rotator cuff tear    left    Family History  Problem Relation Age of Onset  . Adopted: Yes  . Heart disease Father   . Colon cancer Neg Hx   . Colon polyps Neg Hx   . Rectal cancer Neg Hx   . Stomach cancer Neg Hx     SOCIAL HISTORY: Social History   Social History  . Marital status: Divorced    Spouse name: N/A  . Number of children: N/A  . Years of education: N/A   Occupational History  . Not on file.   Social History Main Topics  . Smoking status: Never Smoker  . Smokeless tobacco: Never Used  . Alcohol use Yes     Comment: occasional  . Drug use: No  . Sexual activity: Not on file   Other Topics Concern  . 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           Allergies  Allergen Reactions  . Citrus Other (See Comments)    "nasal congestion"    Current Outpatient Prescriptions  Medication Sig Dispense Refill  . Multiple Vitamin (MULTIVITAMIN WITH MINERALS) TABS Take 1 tablet by mouth every morning.     Current Facility-Administered Medications  Medication Dose Route Frequency Provider Last Rate Last Dose  . 0.9 %  sodium  chloride infusion  500 mL Intravenous Continuous Nelida Meuse III, MD        REVIEW OF SYSTEMS:  [X]  denotes positive finding, [ ]  denotes negative finding Cardiac  Comments:  Chest pain or chest pressure:    Shortness of breath upon exertion:    Short of breath when lying flat:    Irregular heart rhythm:        Vascular    Pain in calf, thigh, or hip brought on by ambulation: X   Pain in feet at night that wakes you up from your sleep:     Blood clot in your veins:    Leg swelling:         Pulmonary    Oxygen at home:    Productive cough:     Wheezing:         Neurologic    Sudden weakness in arms or legs:     Sudden numbness in arms or legs:     Sudden onset of difficulty speaking or slurred speech:    Temporary loss of vision in one eye:     Problems with dizziness:         Gastrointestinal    Blood  in stool:     Vomited blood:         Genitourinary    Burning when urinating:     Blood in urine:        Psychiatric    Major depression:         Hematologic    Bleeding problems:    Problems with blood clotting too easily:        Skin    Rashes or ulcers:        Constitutional    Fever or chills:      PHYSICAL EXAM: Vitals:   04/29/16 0833  BP: 118/75  Pulse: 68  Resp: 18  Temp: 97.2 F (36.2 C)  TempSrc: Oral  SpO2: 96%  Weight: 197 lb 6.4 oz (89.5 kg)  Height: 5\' 10"  (1.778 m)    GENERAL: The patient is a well-nourished male, in no acute distress. The vital signs are documented above. CARDIAC: There is a regular rate and rhythm.  VASCULAR: I do not detect carotid bruits. He has palpable pedal pulses. PULMONARY: There is good air exchange bilaterally without wheezing or rales. ABDOMEN: Soft and non-tender with normal pitched bowel sounds.  MUSCULOSKELETAL: There are no major deformities or cyanosis. NEUROLOGIC: No focal weakness or paresthesias are detected. SKIN: He has a large cluster of varicose veins in his medial left calf. He also has  some telangiectasias and spider veins in his posterior right calf and also the medial left calf. PSYCHIATRIC: The patient has a normal affect.  DATA:   LEFT LOWER EXTREMITY VENOUS DUPLEX: I have independently interpreted his left lower extremity venous duplex can. There is no evidence of DVT or superficial thrombophlebitis. There is deep vein reflux involving the common femoral vein and femoral vein. There is also reflux in the left great saphenous vein.  MEDICAL ISSUES:  CHRONIC VENOUS INSUFFICIENCY: This patient has significant deep vein reflux and also reflux in the left great saphenous vein. We have discussed the importance of intermittent leg elevation the proper positioning for this. I've also given him a prescription for knee-high compression stockings with a gradient of 15-20 mmHg. I've encouraged him to avoid prolonged sitting and standing and to exercise as much as possible. We also discussed water aerobics which I think is also very helpful for people with chronic venous insufficiency. I also discussed potentially trying a thigh-high stocking with a gradient of 20-30 Nadara Mustard he wanted to try to milder gradient first. If his symptoms progressed and he will call and get a stocking for a thigh-high 20-30. If this is not successful in think he would be a good candidate for laser ablation of the left great saphenous vein and stab phlebectomy.   Deitra Mayo [facility] [phone]

## 2016-10-12 ENCOUNTER — Ambulatory Visit (INDEPENDENT_AMBULATORY_CARE_PROVIDER_SITE_OTHER): Payer: Medicare Other | Admitting: Family Medicine

## 2016-10-12 ENCOUNTER — Encounter: Payer: Self-pay | Admitting: Family Medicine

## 2016-10-12 VITALS — BP 102/68 | HR 81 | Temp 98.0°F | Wt 192.2 lb

## 2016-10-12 DIAGNOSIS — B001 Herpesviral vesicular dermatitis: Secondary | ICD-10-CM

## 2016-10-12 MED ORDER — VALACYCLOVIR HCL 1 G PO TABS
ORAL_TABLET | ORAL | 0 refills | Status: DC
Start: 2016-10-12 — End: 2017-09-01

## 2016-10-12 NOTE — Patient Instructions (Signed)
Please follow up in 2 weeks if sore not FULLY healed.

## 2016-10-12 NOTE — Progress Notes (Signed)
Subjective:     Patient ID: Dean Thompson, male   DOB: November 29, 1950, 66 y.o.   MRN: [id number]  HPI Patient seen with possible "cold sores "right lower lip. First noted sore several days ago. He states this started as a blister. He's been applying some Campho-Phenique. He has had cold sores before but not in several years. No oral tobacco use. Nonsmoker. No adenopathy or other concerns noted  Past Medical History:  Diagnosis Date  . Allergy   . Rotator cuff tear    left   Past Surgical History:  Procedure Laterality Date  . COLONOSCOPY  2004   in [place] and was normal per pt  . CYSTECTOMY  2006   subacious cyst  remove from neck  . ROTATOR CUFF REPAIR  1999  . SEPTOPLASTY  1982  . SHOULDER OPEN ROTATOR CUFF REPAIR  10/01/2011   Procedure: ROTATOR CUFF REPAIR SHOULDER OPEN;  Surgeon: Magnus Sinning, MD;  Location: [facility];  Service: Orthopedics;  Laterality: Left;  Left Shoulder Acrominectomy/Open Rotator Cuff Repair/Distal Clavicle Resection    reports that he has never smoked. He has never used smokeless tobacco. He reports that he drinks alcohol. He reports that he does not use drugs. family history includes Heart disease in his father. He was adopted. Allergies  Allergen Reactions  . Citrus Other (See Comments)    "nasal congestion"     Review of Systems  Constitutional: Negative for chills and fever.  Hematological: Negative for adenopathy.       Objective:   Physical Exam  Constitutional: He appears well-developed and well-nourished.  HENT:  Patient has small approximately 3 mm superficial ulcer right lower inner lip. No vesicles. He has slightly indurated border  Neck: Neck supple.  Cardiovascular: Normal rate and regular rhythm.   Lymphadenopathy:    He has no cervical adenopathy.       Assessment:     Probable healing cold sore right lower lip.    Plan:     -Prescribed Valtrex 1 g 2 tablets at onset and two 12 hours later for any future cold sores but  explained would not help with this episode -He is advised to follow-up with primary in 2 weeks if this lesion is not fully healed -Leave off Campho-Phenique or other things that could slow healing  Eulas Post MD [facility]

## 2016-11-13 ENCOUNTER — Ambulatory Visit (INDEPENDENT_AMBULATORY_CARE_PROVIDER_SITE_OTHER): Payer: Medicare Other | Admitting: Adult Health

## 2016-11-13 ENCOUNTER — Other Ambulatory Visit: Payer: Self-pay | Admitting: Family Medicine

## 2016-11-13 ENCOUNTER — Encounter: Payer: Self-pay | Admitting: Adult Health

## 2016-11-13 VITALS — BP 120/64 | Temp 97.9°F | Ht 70.0 in | Wt 194.0 lb

## 2016-11-13 DIAGNOSIS — T148XXA Other injury of unspecified body region, initial encounter: Secondary | ICD-10-CM

## 2016-11-13 MED ORDER — CYCLOBENZAPRINE HCL 10 MG PO TABS: 10.0000 mg | ORAL_TABLET | Freq: Three times a day (TID) | ORAL | 0 refills | Status: DC | PRN

## 2016-11-13 MED ORDER — METHYLPREDNISOLONE 4 MG PO TBPK: ORAL_TABLET | ORAL | 0 refills | Status: DC

## 2016-11-13 NOTE — Progress Notes (Signed)
[facility] called to report that Mr Rhames did not have Rx's for medications at his pharmacy. He was seen today for muscle strain, Flexeril 10 mg and Methylprednisolone were prescribed. I re-sent Rx's to his pharmacy.  Jermani Eberlein [place], MD

## 2016-11-13 NOTE — Progress Notes (Signed)
Subjective:    Patient ID: Dean Thompson, male    DOB: 08/20/1950, 66 y.o.   MRN: [id number]  HPI  66 year old male who presents with the complaint of right lower back pain x 2-3 days. Reports that he woke up one morning with pain in his lower back. Pain is described as a " pulled muscle". Denies any numbness or tingling in his lower extremities. Pain is worse with changing positions and bending. No pain with walking.   Has not had any issues with bowel or bladder   Review of Systems See HPI   Past Medical History:  Diagnosis Date  . Allergy   . Rotator cuff tear    left    Social History   Social History  . Marital status: Divorced    Spouse name: N/A  . Number of children: N/A  . Years of education: N/A   Occupational History  . Not on file.   Social History Main Topics  . Smoking status: Never Smoker  . Smokeless tobacco: Never Used  . Alcohol use Yes     Comment: occasional  . Drug use: No  . Sexual activity: Not on file   Other Topics Concern  . 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           Past Surgical History:  Procedure Laterality Date  . COLONOSCOPY  2004   in [place] and was normal per pt  . CYSTECTOMY  2006   subacious cyst  remove from neck  . ROTATOR CUFF REPAIR  1999  . SEPTOPLASTY  1982  . SHOULDER OPEN ROTATOR CUFF REPAIR  10/01/2011   Procedure: ROTATOR CUFF REPAIR SHOULDER OPEN;  Surgeon: Magnus Sinning, MD;  Location: [facility];  Service: Orthopedics;  Laterality: Left;  Left Shoulder Acrominectomy/Open Rotator Cuff Repair/Distal Clavicle Resection    Family History  Problem Relation Age of Onset  . Adopted: Yes  . Heart disease Father   . Colon cancer Neg Hx   . Colon polyps Neg Hx   . Rectal cancer Neg Hx   . Stomach cancer Neg Hx     Allergies  Allergen Reactions  . Citrus Other (See  Comments)    "nasal congestion"    Current Outpatient Prescriptions on File Prior to Visit  Medication Sig Dispense Refill  . Multiple Vitamin (MULTIVITAMIN WITH MINERALS) TABS Take 1 tablet by mouth every morning.    . valACYclovir (VALTREX) 1000 MG tablet Take two at onset of cold sore and then repeat 2 in 12 hours. (Patient not taking: Reported on 11/13/2016) 30 tablet 0   Current Facility-Administered Medications on File Prior to Visit  Medication Dose Route Frequency Provider Last Rate Last Dose  . 0.9 %  sodium chloride infusion  500 mL Intravenous Continuous Danis, Estill Cotta III, MD        BP 120/64 (BP Location: Left Arm)   Temp 97.9 F (36.6 C) (Oral)   Ht 5\' 10"  (1.778 m)   Wt 194 lb (88 kg)   BMI 27.84 kg/m       Objective:   Physical Exam  Constitutional: He is oriented to person, place, and time. He appears well-developed and well-nourished. No distress.  Cardiovascular: Normal rate, regular rhythm, normal heart sounds and  intact distal pulses.  Exam reveals no gallop and no friction rub.   No murmur heard. Pulmonary/Chest: Effort normal and breath sounds normal. No respiratory distress. He has no wheezes. He has no rales. He exhibits no tenderness.  Musculoskeletal: Normal range of motion. He exhibits tenderness.  Pain with straight leg raise and knee to chest. No pain in lower back with internal or external rotation   Neurological: He is alert and oriented to person, place, and time.  Skin: Skin is warm and dry. No rash noted. He is not diaphoretic. No erythema. No pallor.  Psychiatric: He has a normal mood and affect. His behavior is normal. Judgment and thought content normal.  Nursing note and vitals reviewed.     Assessment & Plan:  1. Muscle strain - Exam consistent with muscle strain. Advised stretching exercises, and heating pad in addition to prescriptions  - cyclobenzaprine (FLEXERIL) 10 MG tablet; Take 1 tablet (10 mg total) by mouth 3 (three) times  daily as needed for muscle spasms.  Dispense: 30 tablet; Refill: 0 - methylPREDNISolone (MEDROL DOSEPAK) 4 MG TBPK tablet; Take as directed  Dispense: 21 tablet; Refill: 0   [name], NP

## 2016-11-19 ENCOUNTER — Other Ambulatory Visit: Payer: Self-pay | Admitting: Adult Health

## 2016-11-23 DIAGNOSIS — M9903 Segmental and somatic dysfunction of lumbar region: Secondary | ICD-10-CM | POA: Diagnosis not present

## 2016-11-23 DIAGNOSIS — M9904 Segmental and somatic dysfunction of sacral region: Secondary | ICD-10-CM | POA: Diagnosis not present

## 2016-11-23 DIAGNOSIS — M5136 Other intervertebral disc degeneration, lumbar region: Secondary | ICD-10-CM | POA: Diagnosis not present

## 2016-11-23 DIAGNOSIS — M5441 Lumbago with sciatica, right side: Secondary | ICD-10-CM | POA: Diagnosis not present

## 2016-11-24 ENCOUNTER — Encounter (HOSPITAL_COMMUNITY): Payer: Self-pay | Admitting: Emergency Medicine

## 2016-11-24 ENCOUNTER — Emergency Department (HOSPITAL_COMMUNITY): Payer: Medicare Other

## 2016-11-24 ENCOUNTER — Emergency Department (HOSPITAL_COMMUNITY)
Admission: EM | Admit: 2016-11-24 | Discharge: 2016-11-25 | Disposition: A | Discharge status: Home / Self Care | Payer: Medicare Other | Attending: Emergency Medicine | Admitting: Emergency Medicine

## 2016-11-24 DIAGNOSIS — M545 Low back pain: Secondary | ICD-10-CM | POA: Diagnosis present

## 2016-11-24 DIAGNOSIS — Z79899 Other long term (current) drug therapy: Secondary | ICD-10-CM | POA: Diagnosis not present

## 2016-11-24 DIAGNOSIS — R52 Pain, unspecified: Secondary | ICD-10-CM | POA: Diagnosis not present

## 2016-11-24 DIAGNOSIS — M5441 Lumbago with sciatica, right side: Secondary | ICD-10-CM | POA: Diagnosis not present

## 2016-11-24 DIAGNOSIS — M549 Dorsalgia, unspecified: Secondary | ICD-10-CM | POA: Diagnosis not present

## 2016-11-24 DIAGNOSIS — M546 Pain in thoracic spine: Secondary | ICD-10-CM | POA: Diagnosis not present

## 2016-11-24 LAB — COMPREHENSIVE METABOLIC PANEL
ALBUMIN: 3.7 g/dL (ref 3.5–5.0)
ALK PHOS: 93 U/L (ref 38–126)
ALT: 22 U/L (ref 17–63)
AST: 19 U/L (ref 15–41)
Anion gap: 7 (ref 5–15)
BUN: 23 mg/dL — ABNORMAL HIGH (ref 6–20)
CALCIUM: 8.5 mg/dL — AB (ref 8.9–10.3)
CO2: 25 mmol/L (ref 22–32)
CREATININE: 1.14 mg/dL (ref 0.61–1.24)
Chloride: 104 mmol/L (ref 101–111)
GFR calc Af Amer: >60 mL/min (ref >60)
GFR calc non Af Amer: >60 mL/min (ref >60)
GLUCOSE: 91 mg/dL (ref 65–99)
Potassium: 4.1 mmol/L (ref 3.5–5.1)
SODIUM: 136 mmol/L (ref 135–145)
Total Bilirubin: 1.2 mg/dL (ref 0.3–1.2)
Total Protein: 6.1 g/dL — ABNORMAL LOW (ref 6.5–8.1)

## 2016-11-24 LAB — CBC WITH DIFFERENTIAL/PLATELET
BASOS PCT: 0 %
Basophils Absolute: 0 10*3/uL (ref 0.0–0.1)
EOS ABS: 0.3 10*3/uL (ref 0.0–0.7)
Eosinophils Relative: 4 %
HEMATOCRIT: 42.8 % (ref 39.0–52.0)
HEMOGLOBIN: 14.6 g/dL (ref 13.0–17.0)
LYMPHS ABS: 1.1 10*3/uL (ref 0.7–4.0)
Lymphocytes Relative: 16 %
MCH: 30.7 pg (ref 26.0–34.0)
MCHC: 34.1 g/dL (ref 30.0–36.0)
MCV: 90.1 fL (ref 78.0–100.0)
Monocytes Absolute: 0.8 10*3/uL (ref 0.1–1.0)
Monocytes Relative: 11 %
NEUTROS ABS: 4.7 10*3/uL (ref 1.7–7.7)
NEUTROS PCT: 69 %
Platelets: 115 10*3/uL — ABNORMAL LOW (ref 150–400)
RBC: 4.75 MIL/uL (ref 4.22–5.81)
RDW: 13.7 % (ref 11.5–15.5)
WBC: 6.8 10*3/uL (ref 4.0–10.5)

## 2016-11-24 MED ORDER — DIAZEPAM 5 MG/ML IJ SOLN
5.0000 mg | Freq: Once | INTRAMUSCULAR | Status: AC
  Administered 2016-11-24: 5 mg via INTRAVENOUS
  Filled 2016-11-24: qty 2

## 2016-11-24 MED ORDER — HYDROCODONE-ACETAMINOPHEN 5-325 MG PO TABS: 1.0000 | ORAL_TABLET | Freq: Four times a day (QID) | ORAL | 0 refills | Status: DC | PRN

## 2016-11-24 MED ORDER — MORPHINE SULFATE (PF) 4 MG/ML IV SOLN
4.0000 mg | Freq: Once | INTRAVENOUS | Status: DC
Start: 2016-11-24 — End: 2016-11-24

## 2016-11-24 MED ORDER — SODIUM CHLORIDE 0.9 % IV BOLUS (SEPSIS)
1000.0000 mL | Freq: Once | INTRAVENOUS | Status: AC
  Administered 2016-11-24: 1000 mL via INTRAVENOUS

## 2016-11-24 MED ORDER — HYDROMORPHONE HCL 1 MG/ML IJ SOLN
1.0000 mg | Freq: Once | INTRAMUSCULAR | Status: AC
  Administered 2016-11-24: 1 mg via INTRAVENOUS
  Filled 2016-11-24: qty 1

## 2016-11-24 MED ORDER — OXYCODONE-ACETAMINOPHEN 5-325 MG PO TABS
1.0000 | ORAL_TABLET | Freq: Once | ORAL | Status: AC
  Administered 2016-11-24: 1 via ORAL
  Filled 2016-11-24: qty 1

## 2016-11-24 NOTE — ED Provider Notes (Signed)
[facility] DEPT Provider Note   CSN: [id number] Arrival date & time: 11/24/16  1902     History   Chief Complaint Chief Complaint  Patient presents with  . Back Pain    HPI Dean Thompson is a 66 y.o. male.  The history is provided by the patient and medical records. No language interpreter was used.  Back Pain   Associated symptoms include numbness. Pertinent negatives include no weakness.   Dean Thompson is a 66 y.o. male who presents to the Emergency Department complaining of low back pain. Patient states that he has had a nagging pain to right low back for the last 2 weeks. He will have good days and bad days. He saw PCP on 9/28 where he was diagnosed with a muscle strain and started on flexeril and medrol dose pack. He experienced mild relief. 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 He endorses tingling to the RLE from hip down to toes. Pain worse with movement. Better when still. No history of similar symptoms. Patient denies upper back or neck pain. No fever, saddle anesthesia, weakness, urinary complaints including retention/incontinence. No history of cancer, IVDU, or recent spinal procedures.  Past Medical History:  Diagnosis Date  . Allergy   . Rotator Thompson tear    left    There are no active problems to display for this patient.   Past Surgical History:  Procedure Laterality Date  . COLONOSCOPY  2004   in [place] and was normal per pt  . CYSTECTOMY  2006   subacious cyst  remove from neck  . ROTATOR Thompson REPAIR  1999  . SEPTOPLASTY  1982  . SHOULDER OPEN ROTATOR Thompson REPAIR  10/01/2011   Procedure: ROTATOR Thompson REPAIR SHOULDER OPEN;  Surgeon: Magnus Sinning, MD;  Location: [facility];  Service: Orthopedics;  Laterality: Left;  Left Shoulder Acrominectomy/Open Rotator Thompson Repair/Distal Clavicle Resection       Home Medications    Prior to Admission  medications   Medication Sig Start Date End Date Taking? Authorizing Provider  cyclobenzaprine (FLEXERIL) 10 MG tablet Take 1 tablet (10 mg total) by mouth 3 (three) times daily as needed for muscle spasms. 11/13/16  Yes Martinique, Betty G, MD  Multiple Vitamin (MULTIVITAMIN WITH MINERALS) TABS Take 1 tablet by mouth every morning.   Yes [provider]  HYDROcodone-acetaminophen (NORCO) 5-325 MG tablet Take 1 tablet by mouth every 6 (six) hours as needed for moderate pain. 11/24/16   Avner Stroder, Ozella Almond, PA-C  methylPREDNISolone (MEDROL DOSEPAK) 4 MG TBPK tablet Take as directed Patient not taking: Reported on 11/24/2016 11/13/16   Martinique, Betty G, MD  valACYclovir (VALTREX) 1000 MG tablet Take two at onset of cold sore and then repeat 2 in 12 hours. Patient not taking: Reported on 11/13/2016 10/12/16   Eulas Post, MD    Family History Family History  Problem Relation Age of Onset  . Adopted: Yes  . Heart disease Father   . Colon cancer Neg Hx   . Colon polyps Neg Hx   . Rectal cancer Neg Hx   . Stomach cancer Neg Hx     Social History Social History  Substance Use Topics  . Smoking status: Never Smoker  . Smokeless tobacco: Never Used  . Alcohol use Yes     Comment: occasional     Allergies   Citrus  Review of Systems Review of Systems  Musculoskeletal: Positive for back pain.  Neurological: Positive for numbness. Negative for weakness.  All other systems reviewed and are negative.    Physical Exam Updated Vital Signs BP 114/85   Pulse 67   Temp 97.6 F (36.4 C) (Oral)   Resp 18   SpO2 96%   Physical Exam  Constitutional: He is oriented to person, place, and time. He appears well-developed and well-nourished.  Neck:  No midline or paraspinal tenderness. Full ROM without pain.  Cardiovascular: Normal rate, regular rhythm, normal heart sounds and intact distal pulses.   Pulmonary/Chest: Effort normal and breath sounds normal. No respiratory distress.   Abdominal: Soft. Bowel sounds are normal. He exhibits no distension. There is no tenderness.  Musculoskeletal:       Back:  No midline T/L tenderness. Tenderness to palpation of right low back as depicted in image. 5/5 muscle strength in LLE. 4/5 in RLE. Straight leg raise positive on right.  Neurological: He is alert and oriented to person, place, and time. He has normal reflexes.  Bilateral lower extremities neurovascularly intact. Sensation equal and intact throughout lower extremities.  Skin: Skin is warm and dry. No rash noted. No erythema.  Nursing note and vitals reviewed.    ED Treatments / Results  Labs (all labs ordered are listed, but only abnormal results are displayed) Labs Reviewed  CBC WITH DIFFERENTIAL/PLATELET - Abnormal; Notable for the following:       Result Value   Platelets 115 (*)    All other components within normal limits  COMPREHENSIVE METABOLIC PANEL - Abnormal; Notable for the following:    BUN 23 (*)    Calcium 8.5 (*)    Total Protein 6.1 (*)    All other components within normal limits    EKG  EKG Interpretation None       Radiology Ct Lumbar Spine Wo Contrast  Result Date: 11/24/2016 CLINICAL DATA:  Back pain for 2 weeks, worsening for 2 days EXAM: CT LUMBAR SPINE WITHOUT CONTRAST TECHNIQUE: Multidetector CT imaging of the lumbar spine was performed without intravenous contrast administration. Multiplanar CT image reconstructions were also generated. COMPARISON:  None. FINDINGS: Segmentation: 5 lumbar type vertebrae. Alignment: Mild retrolisthesis at L5-S1. Vertebrae: No acute fracture or focal pathologic process. Paraspinal and other soft tissues: Negative. Disc levels: T12- L1: Ankylosis by an osteophyte.  No impingement. L1-L2: Ankylosis by ventral osteophyte. Mild facet spurring. No impingement L2-L3: Bulky ventral spondylotic spurring. No herniation or impingement. L3-L4: Spondylosis with disc narrowing and bulging. Interspinous  degenerative sclerosis and irregularity. Moderate spinal stenosis. L4-L5: Disc narrowing with posterior annular ossification and left paracentral disc protrusion. Facet arthropathy with bulky asymmetric left hypertrophy. Advanced spinal stenosis. Noncompressive bilateral foraminal narrowing. L5-S1:Disc narrowing with posterior annular ridging contacting the descending S1 nerve roots. Patent foramina. IMPRESSION: 1. No acute osseous finding. 2. Spondylosis and degenerative disc bulging. Focal advanced facet arthropathy on the left at L4-5. 3. L4-5 advanced spinal stenosis due to endplate ridging and left paracentral disc protrusion superimposed on hypertrophic facets. 4. L3-4 moderate spinal stenosis. 5. L5-S1 endplate spurring contacts but does not compress the descending S1 nerve roots. Electronically Signed   By: Monte Fantasia M.D.   On: 11/24/2016 21:29    Procedures Procedures (including critical care time)  Medications Ordered in ED Medications  HYDROmorphone (DILAUDID) injection 1 mg (1 mg Intravenous Given 11/24/16 1950)  sodium chloride 0.9 % bolus 1,000 mL (0 mLs Intravenous Stopped 11/24/16 2018)  diazepam (  VALIUM) injection 5 mg (5 mg Intravenous Given 11/24/16 1950)  oxyCODONE-acetaminophen (PERCOCET/ROXICET) 5-325 MG per tablet 1 tablet (1 tablet Oral Given 11/24/16 2232)     Initial Impression / Assessment and Plan / ED Course  I have reviewed the triage vital signs and the nursing notes.  Pertinent labs & imaging results that were available during my care of the patient were reviewed by me and considered in my medical decision making (see chart for details).    KRIS NO is a 66 y.o. male who presents to ED for back pain x 2-3 weeks with acute worsening today associated with radiation of pain / numbness to RLE. Sensation equal and intact on exam. CT lumbar shows no acute bony abnormalities, however does show advanced spinal stenosis and chronic degenerative changes. After pain  medication, patient feels improved and is ambulatory in the ED. Discussed referral to neurosurgery for MRI which can be done as an outpatient. He will also follow up with his PCP. He still has muscle relaxants prescribed by PCP. Can continue these, we will add short course of narcotic for additional pain relief. Reasons to return to the emergency department were discussed and all questions answered. Patient feels comfortable with plan and discharge to home.  Patient seen by and discussed with Dr. Darl Householder who agrees with treatment plan.   Final Clinical Impressions(s) / ED Diagnoses   Final diagnoses:  Acute right-sided low back pain with right-sided sciatica    New Prescriptions New Prescriptions   HYDROCODONE-ACETAMINOPHEN (NORCO) 5-325 MG TABLET    Take 1 tablet by mouth every 6 (six) hours as needed for moderate pain.     Teresea Donley, Ozella Almond, PA-C 11/24/16 2357    Drenda Freeze, MD 11/25/16 [phone]

## 2016-11-24 NOTE — Discharge Instructions (Signed)
It was my pleasure taking care of you today!   Take pain medication only as needed for severe pain. You can continue muscle relaxer as needed.   Please call the neurosurgery clinic listed in the morning to schedule a follow up appointment. Follow up with your primary care doctor as well.   Return to the ED for worsening back pain, fever, weakness of either leg, or if you develop either (1) an inability to urinate or have bowel movements, or (2) loss of your ability to control your bathroom functions (if you start having "accidents"), or if you develop other new symptoms that concern you.

## 2016-11-24 NOTE — ED Triage Notes (Signed)
Per [facility], Pt was seen at PCP for pulled back muscle last week. Pt was prescribed flexeril and prednisone, with no relief. Pt reports getting up from chair today when he had a shooting pain from R side of back down to R leg and toes. Pt reports he also had some tingling and numbness to R leg. Pt received 150 mcg of Fentanyl, which relieved his pain. Pt A&O x 4, VSS

## 2016-11-25 ENCOUNTER — Telehealth: Payer: Self-pay | Admitting: Adult Health

## 2016-11-25 ENCOUNTER — Other Ambulatory Visit: Payer: Self-pay | Admitting: Family Medicine

## 2016-11-25 DIAGNOSIS — M5136 Other intervertebral disc degeneration, lumbar region: Secondary | ICD-10-CM

## 2016-11-25 DIAGNOSIS — M5126 Other intervertebral disc displacement, lumbar region: Secondary | ICD-10-CM

## 2016-11-25 DIAGNOSIS — M47816 Spondylosis without myelopathy or radiculopathy, lumbar region: Secondary | ICD-10-CM

## 2016-11-25 NOTE — Telephone Encounter (Signed)
noted 

## 2016-11-25 NOTE — Telephone Encounter (Signed)
Pt wants you to know he went to ED last night with severe back pain.  they did a CT scan. Pt states he probably has a pinched nerve. Pt is to call [place] [facility] for an appt.

## 2016-11-25 NOTE — Telephone Encounter (Signed)
Ok for referral?

## 2016-11-25 NOTE — Telephone Encounter (Addendum)
Pt called back because he said he cannot get anyone to call him back from [place] [facility]. Pt wants to know if we can refer him to them or someone else you suggest. Pt hopes to get in somewhere asap because he is in pain

## 2016-11-25 NOTE — Telephone Encounter (Signed)
It is going to take longer than a day to hear back after a referral is placed,  but sure, ok to refer elsewhere

## 2016-11-25 NOTE — Addendum Note (Signed)
Addended by: Miles Costain T on: 11/25/2016 03:37 PM   Modules accepted: Orders

## 2016-11-26 DIAGNOSIS — M545 Low back pain: Secondary | ICD-10-CM | POA: Diagnosis not present

## 2016-11-26 DIAGNOSIS — M5416 Radiculopathy, lumbar region: Secondary | ICD-10-CM | POA: Diagnosis not present

## 2016-12-02 ENCOUNTER — Encounter: Payer: Self-pay | Admitting: Family Medicine

## 2016-12-02 DIAGNOSIS — Z6828 Body mass index (BMI) 28.0-28.9, adult: Secondary | ICD-10-CM | POA: Diagnosis not present

## 2016-12-02 DIAGNOSIS — M5416 Radiculopathy, lumbar region: Secondary | ICD-10-CM | POA: Diagnosis not present

## 2016-12-02 DIAGNOSIS — R030 Elevated blood-pressure reading, without diagnosis of hypertension: Secondary | ICD-10-CM | POA: Diagnosis not present

## 2016-12-21 DIAGNOSIS — M5416 Radiculopathy, lumbar region: Secondary | ICD-10-CM | POA: Diagnosis not present

## 2016-12-21 DIAGNOSIS — Z01812 Encounter for preprocedural laboratory examination: Secondary | ICD-10-CM | POA: Diagnosis not present

## 2017-01-01 DIAGNOSIS — M5126 Other intervertebral disc displacement, lumbar region: Secondary | ICD-10-CM | POA: Diagnosis not present

## 2017-01-01 DIAGNOSIS — M5116 Intervertebral disc disorders with radiculopathy, lumbar region: Secondary | ICD-10-CM | POA: Diagnosis not present

## 2017-07-28 ENCOUNTER — Encounter: Payer: Self-pay | Admitting: Adult Health

## 2017-07-28 ENCOUNTER — Ambulatory Visit (INDEPENDENT_AMBULATORY_CARE_PROVIDER_SITE_OTHER): Payer: Medicare Other | Admitting: Adult Health

## 2017-07-28 VITALS — BP 124/70 | Temp 97.8°F | Wt 203.0 lb

## 2017-07-28 DIAGNOSIS — J302 Other seasonal allergic rhinitis: Secondary | ICD-10-CM

## 2017-07-28 DIAGNOSIS — L97328 Non-pressure chronic ulcer of left ankle with other specified severity: Secondary | ICD-10-CM | POA: Diagnosis not present

## 2017-07-28 DIAGNOSIS — I83223 Varicose veins of left lower extremity with both ulcer of ankle and inflammation: Secondary | ICD-10-CM

## 2017-07-28 NOTE — Progress Notes (Signed)
Subjective:    Patient ID: MATHEWS STUHR, male    DOB: 08-18-1950, 67 y.o.   MRN: [id number]  HPI  67 year old male who  has a past medical history of Allergy and Rotator cuff tear. He presents to the office today for multiple acute issues  1. Varicose veins - over the last year has had varicose veins in the left leg. Would like to be seen by [facility] to have them removed. Reports that he has noticed that when he is standing for long periods of time, his left leg feels weak.   2. Sore throat - reports over the last 4-5 days. Trouble swallowing. Denies any fevers or feeling acutely ill. Feels as though it may be allergy as this is consistent with his previous seasonal allergy symptoms. He does not use anything OTC for his seasonal allergy symptoms.    Review of Systems See HPI   Past Medical History:  Diagnosis Date  . Allergy   . Rotator cuff tear    left    Social History   Socioeconomic History  . Marital status: Divorced    Spouse name: Not on file  . Number of children: Not on file  . Years of education: Not on file  . Highest education level: Not on file  Occupational History  . Not on file  Social Needs  . Financial resource strain: Not on file  . Food insecurity:    Worry: Not on file    Inability: Not on file  . Transportation needs:    Medical: Not on file    Non-medical: Not on file  Tobacco Use  . Smoking status: Never Smoker  . Smokeless tobacco: Never Used  Substance and Sexual Activity  . Alcohol use: Yes    Comment: occasional  . Drug use: No  . Sexual activity: Not on file  Lifestyle  . Physical activity:    Days per week: Not on file    Minutes per session: Not on file  . Stress: Not on file  Relationships  . Social connections:    Talks on phone: Not on file    Gets together: Not on file    Attends religious service: Not on file    Active member of club or organization: Not on file    Attends meetings of clubs or  organizations: Not on file    Relationship status: Not on file  . Intimate partner violence:    Fear of current or ex partner: Not on file    Emotionally abused: Not on file    Physically abused: Not on file    Forced sexual activity: Not on file  Other Topics Concern  . 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        Past Surgical History:  Procedure Laterality Date  . COLONOSCOPY  2004   in [place] and was normal per pt  . CYSTECTOMY  2006   subacious cyst  remove from neck  . ROTATOR CUFF REPAIR  1999  . SEPTOPLASTY  1982  . SHOULDER OPEN ROTATOR CUFF REPAIR  10/01/2011   Procedure: ROTATOR CUFF REPAIR SHOULDER OPEN;  Surgeon: Magnus Sinning, MD;  Location: [facility];  Service: Orthopedics;  Laterality: Left;  Left Shoulder Acrominectomy/Open Rotator Cuff  Repair/Distal Clavicle Resection    Family History  Adopted: Yes  Problem Relation Age of Onset  . Heart disease Father   . Colon cancer Neg Hx   . Colon polyps Neg Hx   . Rectal cancer Neg Hx   . Stomach cancer Neg Hx     Allergies  Allergen Reactions  . Citrus Other (See Comments)    "nasal congestion"    Current Outpatient Medications on File Prior to Visit  Medication Sig Dispense Refill  . Multiple Vitamin (MULTIVITAMIN WITH MINERALS) TABS Take 1 tablet by mouth every morning.    . valACYclovir (VALTREX) 1000 MG tablet Take two at onset of cold sore and then repeat 2 in 12 hours. 30 tablet 0   Current Facility-Administered Medications on File Prior to Visit  Medication Dose Route Frequency Provider Last Rate Last Dose  . 0.9 %  sodium chloride infusion  500 mL Intravenous Continuous Danis, Estill Cotta III, MD        BP 124/70   Temp 97.8 F (36.6 C) (Oral)   Wt 203 lb (92.1 kg)   BMI 29.13 kg/m       Objective:   Physical Exam  Constitutional: He appears well-developed and  well-nourished. No distress.  HENT:  Right Ear: Tympanic membrane is not erythematous and not bulging. No middle ear effusion.  Left Ear: Tympanic membrane is not erythematous and not bulging. A middle ear effusion is present.  Nose: No mucosal edema or rhinorrhea. Right sinus exhibits no maxillary sinus tenderness and no frontal sinus tenderness. Left sinus exhibits no maxillary sinus tenderness and no frontal sinus tenderness.  Mouth/Throat: Mucous membranes are normal. Posterior oropharyngeal erythema (mild ) present.  + clear PND   Eyes: Pupils are equal, round, and reactive to light. Conjunctivae and EOM are normal. Right eye exhibits no discharge. Left eye exhibits no discharge.  Neck: Normal range of motion. Neck supple.  Cardiovascular: Normal rate, regular rhythm, normal heart sounds and intact distal pulses. Exam reveals no gallop and no friction rub.  No murmur heard. Pulmonary/Chest: Effort normal and breath sounds normal. No stridor. No respiratory distress. He has no wheezes. He has no rales. He exhibits no tenderness.  Neurological: He is alert.  Skin: He is not diaphoretic.  Nursing note and vitals reviewed.     Assessment & Plan:  1. Varicose veins of left lower extremity with inflammation, with ulcer of ankle of other severity ([place]) - Ambulatory referral to Vascular Surgery  2. Seasonal allergies - No signs of strep throat.  - Mild erythema and PND.  - Advised Flonase  - Follow up if not improved   [name], NP

## 2017-08-02 ENCOUNTER — Other Ambulatory Visit: Payer: Self-pay

## 2017-08-02 DIAGNOSIS — I872 Venous insufficiency (chronic) (peripheral): Secondary | ICD-10-CM

## 2017-08-09 ENCOUNTER — Telehealth: Payer: Self-pay | Admitting: Adult Health

## 2017-08-09 ENCOUNTER — Encounter: Payer: Self-pay | Admitting: Adult Health

## 2017-08-09 ENCOUNTER — Ambulatory Visit (INDEPENDENT_AMBULATORY_CARE_PROVIDER_SITE_OTHER): Payer: Medicare Other | Admitting: Adult Health

## 2017-08-09 VITALS — BP 108/60 | Temp 98.0°F | Wt 199.0 lb

## 2017-08-09 DIAGNOSIS — M25511 Pain in right shoulder: Secondary | ICD-10-CM

## 2017-08-09 MED ORDER — HYDROCODONE-ACETAMINOPHEN 10-325 MG PO TABS: 1.0000 | ORAL_TABLET | Freq: Four times a day (QID) | ORAL | 0 refills | Status: AC | PRN

## 2017-08-09 NOTE — Progress Notes (Signed)
Subjective:    Patient ID: Dean Thompson, male    DOB: 09-May-1950, 67 y.o.   MRN: [id number]  HPI 67 year old male who  has a past medical history of Allergy and Rotator cuff tear.  He presents to the office today for right shoulder pain x 6 weeks. He has a history of bilateral rotator cuff repair and had the right repaired " 20 years ago". He reports that over the last few weeks he has been having more pain and loss of strength in his right arm. He denies any trauma. States " it feels like all my other rotator cuff injuries. Does report a " crunching sound"   Pain is worse when sleeping.  He has been using Motrin without relief   Review of Systems See HPI   Past Medical History:  Diagnosis Date  . Allergy   . Rotator cuff tear    left    Social History   Socioeconomic History  . Marital status: Divorced    Spouse name: Not on file  . Number of children: Not on file  . Years of education: Not on file  . Highest education level: Not on file  Occupational History  . Not on file  Social Needs  . Financial resource strain: Not on file  . Food insecurity:    Worry: Not on file    Inability: Not on file  . Transportation needs:    Medical: Not on file    Non-medical: Not on file  Tobacco Use  . Smoking status: Never Smoker  . Smokeless tobacco: Never Used  Substance and Sexual Activity  . Alcohol use: Yes    Comment: occasional  . Drug use: No  . Sexual activity: Not on file  Lifestyle  . Physical activity:    Days per week: Not on file    Minutes per session: Not on file  . Stress: Not on file  Relationships  . Social connections:    Talks on phone: Not on file    Gets together: Not on file    Attends religious service: Not on file    Active member of club or organization: Not on file    Attends meetings of clubs or organizations: Not on file    Relationship status: Not on file  . Intimate partner violence:    Fear of current or ex partner: Not on file   Emotionally abused: Not on file    Physically abused: Not on file    Forced sexual activity: Not on file  Other Topics Concern  . 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        Past Surgical History:  Procedure Laterality Date  . COLONOSCOPY  2004   in [place] and was normal per pt  . CYSTECTOMY  2006   subacious cyst  remove from neck  . ROTATOR CUFF REPAIR  1999  . SEPTOPLASTY  1982  . SHOULDER OPEN ROTATOR CUFF REPAIR  10/01/2011   Procedure: ROTATOR CUFF REPAIR SHOULDER OPEN;  Surgeon: Magnus Sinning, MD;  Location: [facility];  Service: Orthopedics;  Laterality: Left;  Left Shoulder Acrominectomy/Open Rotator Cuff Repair/Distal Clavicle Resection    Family History  Adopted: Yes  Problem Relation Age of Onset  . Heart disease Father   .  Colon cancer Neg Hx   . Colon polyps Neg Hx   . Rectal cancer Neg Hx   . Stomach cancer Neg Hx     Allergies  Allergen Reactions  . Citrus Other (See Comments)    "nasal congestion"    Current Outpatient Medications on File Prior to Visit  Medication Sig Dispense Refill  . Multiple Vitamin (MULTIVITAMIN WITH MINERALS) TABS Take 1 tablet by mouth every morning.    . valACYclovir (VALTREX) 1000 MG tablet Take two at onset of cold sore and then repeat 2 in 12 hours. (Patient not taking: Reported on 08/09/2017) 30 tablet 0   No current facility-administered medications on file prior to visit.     BP 108/60   Temp 98 F (36.7 C) (Oral)   Wt 199 lb (90.3 kg)   BMI 28.55 kg/m       Objective:   Physical Exam  Constitutional: He is oriented to person, place, and time. He appears well-developed and well-nourished. No distress.  Cardiovascular: Normal rate, regular rhythm, normal heart sounds and intact distal pulses.  Pulmonary/Chest: Effort normal and breath sounds normal.  Musculoskeletal: He exhibits  tenderness. He exhibits no edema or deformity.       Right shoulder: He exhibits decreased range of motion, tenderness, pain and decreased strength. He exhibits no swelling, no effusion and no crepitus.  4/5 grip strength in right hand    Neurological: He is alert and oriented to person, place, and time.  Skin: He is not diaphoretic.  Vitals reviewed.     Assessment & Plan:  1. Acute pain of right shoulder - High suspicion for rotator cuff injury. Will refer to orthopedics and give short course of pain medication.  - AMB referral to orthopedics - HYDROcodone-acetaminophen (NORCO) 10-325 MG tablet; Take 1-2 tablets by mouth every 6 (six) hours as needed for up to 15 days.  Dispense: 10 tablet; Refill: 0 - Follow up as needed  [name], NP

## 2017-08-09 NOTE — Telephone Encounter (Signed)
Copied from [facility]. Topic: Quick Communication - See Telephone Encounter >> Aug 09, 2017  5:16 PM Vernona Rieger wrote: CRM for notification. See Telephone encounter for: 08/09/17.   Geraldine needs clarification on the directions HYDROcodone-acetaminophen (NORCO) 10-325 MG tablet  Call back [phone]

## 2017-08-10 NOTE — Telephone Encounter (Signed)
Spoke to the pharmacy.  Directions that were sent over exceed the maximum dosage allowed for this medication in a 24 hour period.  Pharmacy requested to add directions stating pt should not take more than 5 tablets in a 24 hour period.  Authorized addendum to sig.  No further action required.  Will close note.

## 2017-08-17 ENCOUNTER — Ambulatory Visit (INDEPENDENT_AMBULATORY_CARE_PROVIDER_SITE_OTHER): Payer: Self-pay

## 2017-08-17 ENCOUNTER — Ambulatory Visit (INDEPENDENT_AMBULATORY_CARE_PROVIDER_SITE_OTHER): Payer: Medicare Other | Admitting: Orthopaedic Surgery

## 2017-08-17 ENCOUNTER — Encounter (INDEPENDENT_AMBULATORY_CARE_PROVIDER_SITE_OTHER): Payer: Self-pay | Admitting: Orthopaedic Surgery

## 2017-08-17 VITALS — BP 118/69 | HR 72 | Resp 14 | Ht 70.0 in | Wt 195.0 lb

## 2017-08-17 DIAGNOSIS — G8929 Other chronic pain: Secondary | ICD-10-CM

## 2017-08-17 DIAGNOSIS — M25511 Pain in right shoulder: Secondary | ICD-10-CM | POA: Diagnosis not present

## 2017-08-17 NOTE — Progress Notes (Signed)
Office Visit Note   Patient: Dean Thompson           Date of Birth: 11-Feb-1951           MRN: [id number] Visit Date: 08/17/2017              Requested by: [name], NP [address], [place] [address] PCP: [name], NP   Assessment & Plan: Visit Diagnoses:  1. Chronic right shoulder pain     Plan: Long discussion regarding diagnostic possibilities of right shoulder pain.  I suspect he has either primary osteoarthritis or possibly rotator cuff arthropathy.  Discussed treatment with physical therapy, NSAIDs, cortisone injection and even definitive surgery.  He would like to try the physical therapy.  At some point I might consider MRI scan if no improvement.  We will set up physical therapy and plan to see him back as needed.  Follow-Up Instructions: Return if symptoms worsen or fail to improve.   Orders:  Orders Placed This Encounter  Procedures  . XR Shoulder Right  . Ambulatory referral to Physical Therapy   No orders of the defined types were placed in this encounter.     Procedures: No procedures performed   Clinical Data: No additional findings.   Subjective: Chief Complaint  Patient presents with  . Right Shoulder - Pain, Weakness  Dean Thompson is a 67 years old visited the office for evaluation of right shoulder pain.  His past history is significant that in 1999 he had a rotator cuff tear repair.  He has done well until the past several months when he began to experience  more pain, stiffness and weakness.  No recurrent injury or trauma.  He does lift weights but has been unable to perform that activity for 6 or 8 weeks because of his discomfort.  No related referred pain from cervical spine.  No numbness or tingling.  HPI  Review of Systems  Constitutional: Negative for fatigue.  HENT: Negative for sinus pain.   Respiratory: Negative for cough and chest tightness.   Cardiovascular: Negative for chest pain.  Gastrointestinal:  Negative for abdominal pain.  Endocrine: Negative for polyuria.  Genitourinary: Negative for penile pain.  Musculoskeletal: Negative for neck pain.  Skin: Negative for rash.  Allergic/Immunologic: Negative for food allergies.  Neurological: Positive for weakness. Negative for dizziness, numbness and headaches.  Hematological: Does not bruise/bleed easily.     Objective: Vital Signs: BP 118/69 (BP Location: Left Arm, Patient Position: Sitting, Cuff Size: Normal)   Pulse 72   Resp 14   Ht 5\' 10"  (1.778 m)   Wt 195 lb (88.5 kg) Comment: per patient  BMI 27.98 kg/m   Physical Exam  Constitutional: He is oriented to person, place, and time. He appears well-developed and well-nourished.  HENT:  Mouth/Throat: Oropharynx is clear and moist.  Eyes: Pupils are equal, round, and reactive to light. EOM are normal.  Pulmonary/Chest: Effort normal.  Neurological: He is alert and oriented to person, place, and time.  Skin: Skin is warm and dry.  Psychiatric: He has a normal mood and affect. His behavior is normal.    Ortho Exam awake alert and oriented x3.  Comfortable sitting.  No pain referred to right shoulder with range of motion of cervical spine.  Positive impingement.  With external rotation. empty can testing was negative.  Does have some limitation of external rotation of the right shoulder compared to the left.  Internal rotation is symmetrical.  No pain with abduction.  Did not have any significant weakness with internal/external rotation.  Good grip and good release.  Old rotator cuff tear repair scar.  Skin and intact  Specialty Comments:  No specialty comments available.  Imaging: Xr Shoulder Right  Result Date: 08/17/2017 Films of the right shoulder obtained in several projections.  There is evidence of osteoarthritis of the glenohumeral joint with and and inferior humeral head spur.  There is some spurring of the inferior glenoid.  Marginal space between the humeral head and  the acromion with some subchondral sclerosis in the area of the greater tuberosity.  Old metallic screw from prior rotator cuff tear repair degenerative changes of the acromioclavicular joint atrophic changes the distal clavicle.  Findings consistent with primary osteoarthritis with a  possibility of a rotator cuff arthropathy    PMFS History: There are no active problems to display for this patient.  Past Medical History:  Diagnosis Date  . Allergy   . Rotator cuff tear    left    Family History  Adopted: Yes  Problem Relation Age of Onset  . Heart disease Father   . Colon cancer Neg Hx   . Colon polyps Neg Hx   . Rectal cancer Neg Hx   . Stomach cancer Neg Hx     Past Surgical History:  Procedure Laterality Date  . COLONOSCOPY  2004   in [place] and was normal per pt  . CYSTECTOMY  2006   subacious cyst  remove from neck  . ROTATOR CUFF REPAIR Right 1999  . SEPTOPLASTY  1982  . SHOULDER OPEN ROTATOR CUFF REPAIR  10/01/2011   Procedure: ROTATOR CUFF REPAIR SHOULDER OPEN;  Surgeon: Magnus Sinning, MD;  Location: [facility];  Service: Orthopedics;  Laterality: Left;  Left Shoulder Acrominectomy/Open Rotator Cuff Repair/Distal Clavicle Resection  . SPINE SURGERY  2018   Social History   Occupational History  . Not on file  Tobacco Use  . Smoking status: Never Smoker  . Smokeless tobacco: Never Used  Substance and Sexual Activity  . Alcohol use: Yes    Comment: occasional  . Drug use: Never  . Sexual activity: Not on file

## 2017-08-30 ENCOUNTER — Ambulatory Visit: Payer: Medicare Other | Attending: Orthopaedic Surgery

## 2017-08-30 ENCOUNTER — Other Ambulatory Visit: Payer: Self-pay

## 2017-08-30 DIAGNOSIS — G8929 Other chronic pain: Secondary | ICD-10-CM

## 2017-08-30 DIAGNOSIS — M25511 Pain in right shoulder: Secondary | ICD-10-CM | POA: Insufficient documentation

## 2017-08-30 DIAGNOSIS — M25611 Stiffness of right shoulder, not elsewhere classified: Secondary | ICD-10-CM | POA: Diagnosis not present

## 2017-08-30 DIAGNOSIS — M6281 Muscle weakness (generalized): Secondary | ICD-10-CM | POA: Diagnosis not present

## 2017-08-30 NOTE — Therapy (Addendum)
[place], [place], [address] Phone: [phone]   Fax:  [phone]  Physical Therapy Evaluation  Patient Details  Name: Dean Thompson MRN: [id number] Date of Birth: [birth year] Referring Provider: Joni Fears, MD   Encounter Date: 08/30/2017  PT End of Session - 08/30/17 0717    Visit Number  1    Number of Visits  12    Date for PT Re-Evaluation  10/08/17    Authorization Type  KX at visit 15 , progress note at visit 10    PT Start Time  0703    PT Stop Time  0745    PT Time Calculation (min)  42 min    Activity Tolerance  Patient tolerated treatment well    Behavior During Therapy  [facility] for tasks assessed/performed       Past Medical History:  Diagnosis Date  . Allergy   . Rotator cuff tear    left    Past Surgical History:  Procedure Laterality Date  . COLONOSCOPY  2004   in [place] and was normal per pt  . CYSTECTOMY  2006   subacious cyst  remove from neck  . ROTATOR CUFF REPAIR Right 1999  . SEPTOPLASTY  1982  . SHOULDER OPEN ROTATOR CUFF REPAIR  10/01/2011   Procedure: ROTATOR CUFF REPAIR SHOULDER OPEN;  Surgeon: Dean Sinning, MD;  Location: [facility];  Service: Orthopedics;  Laterality: Left;  Left Shoulder Acrominectomy/Open Rotator Cuff Repair/Distal Clavicle Resection  . SPINE SURGERY  2018    There were no vitals filed for this visit.   Subjective Assessment - 08/30/17 0711    Subjective  He reports being active and lifting weight. Maybe from lifting may have reinjured shoulder. Not working our now.   ROM with pain. More pain in PM    Limitations  Lifting sleeping.   > 15 pounds  it is difficult to do    Diagnostic tests  Xray'; OA    Patient Stated Goals  He wants to decr pain at night to sleep better.       Currently in Pain?  No/denies at rest    Pain Score  4  with use of RT arm    Pain Location  Shoulder    Pain Orientation  Right trap area most    Pain  Descriptors / Indicators  -- torn RTC in past. Some one sticking pins in shoulder    Pain Type  Chronic pain    Pain Onset  More than a month ago    Pain Frequency  Intermittent    Aggravating Factors   lifting weight for exercise    Pain Relieving Factors  rest          [facility] PT Assessment - 08/30/17 0001      Assessment   Medical Diagnosis  chronic right shoulder pain    Referring Provider  Dean Fears, MD    Onset Date/Surgical Date  -- 3 months ago    Hand Dominance  Right    Next MD Visit  As needed    Prior Therapy  Pt in past post RTC repair      Precautions   Precautions  None      Restrictions   Weight Bearing Restrictions  No      Balance Screen   Has the patient fallen in the past 6 months  No    Has the patient had a decrease in activity level  because of a fear of falling?   No    Is the patient reluctant to leave their home because of a fear of falling?   No      Prior Function   Level of Independence  Independent      Cognition   Overall Cognitive Status  Within Functional Limits for tasks assessed      Observation/Other Assessments   Focus on Therapeutic Outcomes (FOTO)   36% limited      ROM / Strength   AROM / PROM / Strength  AROM;PROM;Strength      AROM   AROM Assessment Site  Shoulder    Right/Left Shoulder  Right;Left    Right Shoulder Extension  38 Degrees    Right Shoulder Flexion  123 Degrees    Right Shoulder ABduction  118 Degrees    Right Shoulder Internal Rotation  22 Degrees reach behind back 7 in > LT    Right Shoulder External Rotation  63 Degrees    Right Shoulder Horizontal ABduction  5 Degrees    Right Shoulder Horizontal  ADduction  108 Degrees    Left Shoulder Extension  47 Degrees    Left Shoulder Flexion  115 Degrees    Left Shoulder ABduction  58 Degrees    Left Shoulder Internal Rotation  28 Degrees    Left Shoulder External Rotation  60 Degrees    Left Shoulder Horizontal ABduction  15 Degrees    Left Shoulder  Horizontal ADduction  106 Degrees      Strength   Strength Assessment Site  Shoulder    Right/Left Shoulder  Right;Left    Right Shoulder Flexion  4+/5    Right Shoulder Extension  5/5    Right Shoulder ABduction  5/5    Right Shoulder Internal Rotation  5/5    Right Shoulder External Rotation  5/5    Right Shoulder Horizontal ABduction  5/5    Right Shoulder Horizontal ADduction  5/5    Left Shoulder Flexion  5/5    Left Shoulder Extension  5/5    Left Shoulder ABduction  5/5    Left Shoulder Internal Rotation  5/5    Left Shoulder External Rotation  5/5    Left Shoulder Horizontal ABduction  5/5    Left Shoulder Horizontal ADduction  5/5      Palpation   Palpation comment  mild crepitus noted RT shoulder not left      Ambulation/Gait   Gait Comments  WNL                Objective measurements completed on examination: See above findings.              PT Education - 08/30/17 0717    Education Details  POC ,   red band rockwood and IR/ER stretch    Person(s) Educated  Patient    Methods  Explanation;Demonstration;Verbal cues;Handout    Comprehension  Verbalized understanding;Returned demonstration       PT Short Term Goals - 08/30/17 0746      PT SHORT TERM GOAL #1   Title  He will be independent with initial HEp     Time  3    Period  Weeks    Status  New      PT SHORT TERM GOAL #2   Title  He will report pain decr 25% or more with sleep    Time  3    Period  Weeks  Status  New        PT Long Term Goals - 08/30/17 0746      PT LONG TERM GOAL #1   Title  He willbe independnet with all hEP issued    Time  6    Period  Weeks    Status  New      PT LONG TERM GOAL #2   Title  He will report able to sleep 50% improvement of more    Time  6    Period  Weeks    Status  New      PT LONG TERM GOAL #3   Title  He will e able to lift 10 pounds for exercise without incr pain    Time  6    Period  Weeks    Status  New      PT LONG  TERM GOAL #4   Title  He will improve RT shoulder IR to equal LT shoulder behind back. to allow for painfree use of Rt arm    Time  6    Period  Weeks    Status  New             Plan - 08/30/17 0741    Clinical Impression Statement  Dean Thompson presents with 3 months of RT shoulder pain feeling this is RTC related. He has normal strength except flexion with pain4+/5. ROM limited bilaterally but more on Rt  with rotation.   He should improve with decr pain with PT    History and Personal Factors relevant to plan of care:  RT repair RT/LT    Clinical Presentation  Stable    Clinical Decision Making  Low    Rehab Potential  Good    Clinical Impairments Affecting Rehab Potential  previous RTC repain , OA RT shoulder    PT Frequency  2x / week    PT Duration  6 weeks    PT Treatment/Interventions  Dry needling;Manual techniques;Patient/family education;Therapeutic exercise;Therapeutic activities;Cryotherapy;Ultrasound;Moist Heat;Iontophoresis 4mg /ml Dexamethasone;Passive range of motion    PT Next Visit Plan  Review HEP ,  Manual , modalities,     PT Home Exercise Plan  rockwood red band , ER/IR stretch    Consulted and Agree with Plan of Care  Patient       Patient will benefit from skilled therapeutic intervention in order to improve the following deficits and impairments:  Pain, Increased muscle spasms, Impaired UE functional use, Decreased range of motion, Decreased strength, Decreased activity tolerance  Visit Diagnosis: Chronic right shoulder pain - Plan: PT plan of care cert/re-cert  Stiffness of right shoulder joint - Plan: PT plan of care cert/re-cert  Muscle weakness (generalized) - Plan: PT plan of care cert/re-cert     Problem List There are no active problems to display for this patient.   Darrel Hoover  PT 08/30/2017, 7:55 AM  [facility] [address] [place], [place], [address] Phone: [phone]   Fax:   [phone]  Name: Dean Thompson MRN: [id number] Date of Birth: 05-Aug-1950

## 2017-08-30 NOTE — Patient Instructions (Signed)
Strengthening: Resisted Internal Rotation   Hold tubing in left hand, elbow at side and forearm out. Rotate forearm in across body. Repeat ____ times per set. Do ____ sets per session. Do ____ sessions per day.  http://orth.exer.us/830   Copyright  VHI. All rights reserved.  Strengthening: Resisted External Rotation   Hold tubing in right hand, elbow at side and forearm across body. Rotate forearm out. Repeat ____ times per set. Do ____ sets per session. Do ____ sessions per day.  http://orth.exer.us/828   Copyright  VHI. All rights reserved.  Strengthening: Resisted Flexion   Hold tubing with left arm at side. Pull forward and up. Move shoulder through pain-free range of motion. Repeat ____ times per set. Do ____ sets per session. Do ____ sessions per day.  http://orth.exer.us/824   Copyright  VHI. All rights reserved.  Strengthening: Resisted Extension   Hold tubing in right hand, arm forward. Pull arm back, elbow straight. Repeat ____ times per set. Do ____ sets per session. Do ____ sessions per day. ALL EXERCISES 10-20 REPS 2X/DAY STOP IF PAINFUL http://orth.exer.us/832   Copyright  VHI. All rights reserved.  iNT ROT BEHIND BACK  ER AT DOOR FRAME 30-60 SEC 2-3 REPS EACH

## 2017-09-01 ENCOUNTER — Encounter: Payer: Self-pay | Admitting: Adult Health

## 2017-09-01 ENCOUNTER — Ambulatory Visit (INDEPENDENT_AMBULATORY_CARE_PROVIDER_SITE_OTHER): Payer: Medicare Other | Admitting: Adult Health

## 2017-09-01 VITALS — BP 110/70 | Temp 97.9°F | Ht 69.0 in | Wt 188.0 lb

## 2017-09-01 DIAGNOSIS — G8929 Other chronic pain: Secondary | ICD-10-CM | POA: Diagnosis not present

## 2017-09-01 DIAGNOSIS — N400 Enlarged prostate without lower urinary tract symptoms: Secondary | ICD-10-CM | POA: Diagnosis not present

## 2017-09-01 DIAGNOSIS — M25511 Pain in right shoulder: Secondary | ICD-10-CM

## 2017-09-01 DIAGNOSIS — Z1159 Encounter for screening for other viral diseases: Secondary | ICD-10-CM | POA: Diagnosis not present

## 2017-09-01 DIAGNOSIS — T782XXA Anaphylactic shock, unspecified, initial encounter: Secondary | ICD-10-CM

## 2017-09-01 DIAGNOSIS — Z23 Encounter for immunization: Secondary | ICD-10-CM

## 2017-09-01 DIAGNOSIS — E782 Mixed hyperlipidemia: Secondary | ICD-10-CM | POA: Diagnosis not present

## 2017-09-01 LAB — CBC WITH DIFFERENTIAL/PLATELET
BASOS ABS: 0 10*3/uL (ref 0.0–0.1)
Basophils Relative: 0.2 % (ref 0.0–3.0)
EOS ABS: 0.1 10*3/uL (ref 0.0–0.7)
Eosinophils Relative: 2.6 % (ref 0.0–5.0)
HCT: 44.3 % (ref 39.0–52.0)
Hemoglobin: 15.4 g/dL (ref 13.0–17.0)
LYMPHS ABS: 1.1 10*3/uL (ref 0.7–4.0)
Lymphocytes Relative: 20.8 % (ref 12.0–46.0)
MCHC: 34.8 g/dL (ref 30.0–36.0)
MCV: 90.6 fl (ref 78.0–100.0)
MONOS PCT: 7.5 % (ref 3.0–12.0)
Monocytes Absolute: 0.4 10*3/uL (ref 0.1–1.0)
Neutro Abs: 3.6 10*3/uL (ref 1.4–7.7)
Neutrophils Relative %: 68.9 % (ref 43.0–77.0)
PLATELETS: 134 10*3/uL — AB (ref 150.0–400.0)
RBC: 4.89 Mil/uL (ref 4.22–5.81)
RDW: 13.5 % (ref 11.5–15.5)
WBC: 5.2 10*3/uL (ref 4.0–10.5)

## 2017-09-01 LAB — BASIC METABOLIC PANEL
BUN: 23 mg/dL (ref 6–23)
CALCIUM: 9.2 mg/dL (ref 8.4–10.5)
CO2: 29 mEq/L (ref 19–32)
CREATININE: 1.3 mg/dL (ref 0.40–1.50)
Chloride: 106 mEq/L (ref 96–112)
GFR: 58.48 mL/min — AB (ref >60.00)
Glucose, Bld: 98 mg/dL (ref 70–99)
Potassium: 4.8 mEq/L (ref 3.5–5.1)
Sodium: 142 mEq/L (ref 135–145)

## 2017-09-01 LAB — HEPATIC FUNCTION PANEL
ALBUMIN: 4.3 g/dL (ref 3.5–5.2)
ALK PHOS: 83 U/L (ref 39–117)
ALT: 17 U/L (ref 0–53)
AST: 15 U/L (ref 0–37)
BILIRUBIN DIRECT: 0.2 mg/dL (ref 0.0–0.3)
Total Bilirubin: 1.4 mg/dL — ABNORMAL HIGH (ref 0.2–1.2)
Total Protein: 6.8 g/dL (ref 6.0–8.3)

## 2017-09-01 LAB — LIPID PANEL
CHOL/HDL RATIO: 5
Cholesterol: 180 mg/dL (ref 0–200)
HDL: 32.8 mg/dL — ABNORMAL LOW (ref >39.00)
NonHDL: 146.84
Triglycerides: 220 mg/dL — ABNORMAL HIGH (ref 0.0–149.0)
VLDL: 44 mg/dL — ABNORMAL HIGH (ref 0.0–40.0)

## 2017-09-01 LAB — PSA: PSA: 1.97 ng/mL (ref 0.10–4.00)

## 2017-09-01 LAB — LDL CHOLESTEROL, DIRECT: LDL DIRECT: 115 mg/dL

## 2017-09-01 LAB — TSH: TSH: 1.39 u[IU]/mL (ref 0.35–4.50)

## 2017-09-01 MED ORDER — EPINEPHRINE 0.3 MG/0.3ML IJ SOAJ
0.3000 mg | Freq: Once | INTRAMUSCULAR | 1 refills | Status: AC
Start: 2017-09-01 — End: 2017-09-01

## 2017-09-01 NOTE — Progress Notes (Signed)
Subjective:    Patient ID: Dean Thompson, male    DOB: Jul 14, 1950, 67 y.o.   MRN: [id number]  HPI  Patient presents for yearly preventative medicine examination. He is a pleasant 67 year old male who  has a past medical history of Allergy, Hyperlipidemia, and Rotator cuff tear.  Hyperlipidemia - diet controlled.  Lab Results  Component Value Date   CHOL 178 10/09/2015   HDL 35.80 (L) 10/09/2015   LDLCALC 120 (H) 10/09/2015   TRIG 115.0 10/09/2015   CHOLHDL 5 10/09/2015   Right Shoulder  Pain -we have bilateral rotator cuff tear.  Presented to the office on 08/09/2017 for the complaint of right shoulder pain that was similar to when his rotator cuffs were torn.  He was sent to orthopedics for further evaluation who subsequently sent him to physical therapy. He reports that he has gone to one physical therapy appointment.  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 He is due for Pnuemovax 23 vaccination   Appropriate screening laboratory values were ordered for the patient including screening of hyperlipidemia, renal function and hepatic function. If indicated by BPH, a PSA was ordered.  Medication reconciliation,  past medical history, social history, problem list and allergies were reviewed in detail with the patient  Goals were established with regard to weight loss, exercise, and  diet in compliance with medications Wt Readings from Last 3 Encounters:  09/01/17 188 lb (85.3 kg)  08/17/17 195 lb (88.5 kg)  08/09/17 199 lb (90.3 kg)   End of life planning was discussed.  He is up-to-date on colonoscopies, dental and vision screens.  Review of Systems  Constitutional: Negative.   HENT: Negative.   Eyes: Negative.   Respiratory: Negative.   Cardiovascular: Negative.   Gastrointestinal: Negative.    Endocrine: Negative.   Genitourinary: Negative.   Musculoskeletal: Positive for arthralgias.  Skin: Negative.   Allergic/Immunologic: Negative.   Neurological: Negative.   Hematological: Negative.   Psychiatric/Behavioral: Negative.   All other systems reviewed and are negative.  Past Medical History:  Diagnosis Date  . Allergy   . Hyperlipidemia   . Rotator cuff tear    left    Social History   Socioeconomic History  . Marital status: Divorced    Spouse name: Not on file  . Number of children: Not on file  . Years of education: Not on file  . Highest education level: Not on file  Occupational History  . Not on file  Social Needs  . Financial resource strain: Not on file  . Food insecurity:    Worry: Not on file    Inability: Not on file  . Transportation needs:    Medical: Not on file    Non-medical: Not on file  Tobacco Use  . Smoking status: Never Smoker  . Smokeless tobacco: Never Used  Substance and Sexual Activity  . Alcohol use: Yes    Comment: occasional  . Drug use: Never  . Sexual activity: Not on file  Lifestyle  . Physical activity:    Days per week: Not on file    Minutes per session: Not on file  . Stress: Not on file  Relationships  . Social connections:    Talks on phone: Not on file    Gets together: Not on  file    Attends religious service: Not on file    Active member of club or organization: Not on file    Attends meetings of clubs or organizations: Not on file    Relationship status: Not on file  . Intimate partner violence:    Fear of current or ex partner: Not on file    Emotionally abused: Not on file    Physically abused: Not on file    Forced sexual activity: Not on file  Other Topics Concern  . 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        Past Surgical History:  Procedure Laterality Date   . COLONOSCOPY  2004   in [place] and was normal per pt  . CYSTECTOMY  2006   subacious cyst  remove from neck  . ROTATOR CUFF REPAIR Right 1999  . SEPTOPLASTY  1982  . SHOULDER OPEN ROTATOR CUFF REPAIR  10/01/2011   Procedure: ROTATOR CUFF REPAIR SHOULDER OPEN;  Surgeon: Magnus Sinning, MD;  Location: [facility];  Service: Orthopedics;  Laterality: Left;  Left Shoulder Acrominectomy/Open Rotator Cuff Repair/Distal Clavicle Resection  . SPINE SURGERY  2018    Family History  Adopted: Yes  Problem Relation Age of Onset  . Heart disease Father   . Colon cancer Neg Hx   . Colon polyps Neg Hx   . Rectal cancer Neg Hx   . Stomach cancer Neg Hx     Allergies  Allergen Reactions  . Citrus Other (See Comments)    "nasal congestion"    Current Outpatient Medications on File Prior to Visit  Medication Sig Dispense Refill  . Multiple Vitamin (MULTIVITAMIN WITH MINERALS) TABS Take 1 tablet by mouth every morning.     No current facility-administered medications on file prior to visit.     BP 110/70   Temp 97.9 F (36.6 C) (Oral)   Ht 5\' 9"  (1.753 m)   Wt 188 lb (85.3 kg)   BMI 27.76 kg/m       Objective:   Physical Exam  Constitutional: He is oriented to person, place, and time. He appears well-developed and well-nourished. No distress.  HENT:  Head: Normocephalic and atraumatic.  Right Ear: External ear normal.  Left Ear: External ear normal.  Nose: Nose normal.  Mouth/Throat: Oropharynx is clear and moist. No oropharyngeal exudate.  Eyes: Pupils are equal, round, and reactive to light. Conjunctivae and EOM are normal. Right eye exhibits no discharge. Left eye exhibits no discharge. No scleral icterus.  Neck: Normal range of motion. Neck supple. No JVD present. No tracheal deviation present. No thyromegaly present.  Cardiovascular: Normal rate, regular rhythm, normal heart sounds and intact distal pulses. Exam reveals no gallop and no friction rub.  No murmur  heard. Pulmonary/Chest: Effort normal and breath sounds normal. No stridor. No respiratory distress. He has no wheezes. He has no rales. He exhibits no tenderness.  Abdominal: Soft. Bowel sounds are normal. He exhibits no distension and no mass. There is no tenderness. There is no rebound and no guarding. No hernia.  Musculoskeletal: Normal range of motion. He exhibits no edema, tenderness or deformity.  Lymphadenopathy:    He has no cervical adenopathy.  Neurological: He is alert and oriented to person, place, and time. He displays normal reflexes.  No cranial nerve deficit or sensory deficit. He exhibits normal muscle tone. Coordination normal.  Skin: Skin is warm and dry. Capillary refill takes less than 2 seconds. No rash noted. He is not diaphoretic. No erythema. No pallor.  Psychiatric: He has a normal mood and affect. His behavior is normal. Judgment and thought content normal.  Nursing note and vitals reviewed.     Assessment & Plan:  1. Mixed hyperlipidemia - Consider statin  - Basic metabolic panel - CBC with Differential/Platelet - Hepatic function panel - Lipid panel - TSH  2. Chronic right shoulder pain - Continue with PT and orthopedics follow up   3. Benign prostatic hyperplasia without lower urinary tract symptoms  - PSA  4. Need for hepatitis C screening test  - Hep C Antibody  5. Need for Streptococcus pneumoniae vaccination  - Pneumococcal polysaccharide vaccine 23-valent greater than or equal to 2yo subcutaneous/IM  6. Anaphylaxis, initial encounter  - EPINEPHrine (EPIPEN 2-PAK) 0.3 mg/0.3 mL IJ SOAJ injection; Inject 0.3 mLs (0.3 mg total) into the muscle once for 1 dose.  Dispense: 1 Device; Refill: 1   [name], NP

## 2017-09-01 NOTE — Patient Instructions (Signed)
It was great seeing you today   I have sent in an Epi Pen for you to have   I will follow up with you regarding your blood work   Please let me know if I can do anything for you

## 2017-09-02 LAB — HEPATITIS C ANTIBODY
Hepatitis C Ab: NONREACTIVE
SIGNAL TO CUT-OFF: 0.02 (ref <1.00)

## 2017-09-06 ENCOUNTER — Ambulatory Visit (HOSPITAL_COMMUNITY)
Admission: RE | Admit: 2017-09-06 | Discharge: 2017-09-06 | Disposition: A | Discharge status: Home / Self Care | Payer: Medicare Other | Source: Ambulatory Visit | Attending: Vascular Surgery | Admitting: Vascular Surgery

## 2017-09-06 DIAGNOSIS — R936 Abnormal findings on diagnostic imaging of limbs: Secondary | ICD-10-CM | POA: Diagnosis not present

## 2017-09-06 DIAGNOSIS — I872 Venous insufficiency (chronic) (peripheral): Secondary | ICD-10-CM | POA: Insufficient documentation

## 2017-09-07 ENCOUNTER — Encounter (HOSPITAL_COMMUNITY): Payer: Medicare Other

## 2017-09-08 ENCOUNTER — Encounter: Payer: Self-pay | Admitting: Vascular Surgery

## 2017-09-08 ENCOUNTER — Ambulatory Visit (INDEPENDENT_AMBULATORY_CARE_PROVIDER_SITE_OTHER): Payer: Medicare Other | Admitting: Vascular Surgery

## 2017-09-08 ENCOUNTER — Other Ambulatory Visit: Payer: Self-pay

## 2017-09-08 VITALS — BP 106/62 | HR 74 | Resp 18 | Ht 69.0 in | Wt 198.8 lb

## 2017-09-08 DIAGNOSIS — I872 Venous insufficiency (chronic) (peripheral): Secondary | ICD-10-CM

## 2017-09-08 NOTE — Progress Notes (Signed)
Patient name: Dean Thompson MRN: [id number] DOB: Mar 18, 1950 Sex: male  REASON FOR VISIT:   Follow-up of chronic venous insufficiency  HPI:   Dean Thompson is a pleasant 67 y.o. male who I last saw on 04/29/2016 with varicose veins of the left leg.  At that time he had significant symptoms of aching and heaviness in the left leg.  He did have reflux in the common femoral vein and femoral vein also reflux in the left great saphenous vein.  We discussed the importance of intermittent leg elevation.  I wrote him a prescription for compression stockings and he presents for a follow-up visit.  Since I saw him last, he is varicose veins have become progressively worse.  He experiences significant aching pain and heaviness in his legs which is aggravated by standing and relieved with elevation.  He has been wearing his thigh-high compression stockings for the last year.  He does not think they help significantly.  He does not like to take ibuprofen.  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  He also has some associated swelling in his legs when he is been standing for long period time.  There have been no significant changes in his medical history since I saw him last.  Past Medical History:  Diagnosis Date  . Allergy   . Hyperlipidemia   . Rotator cuff tear    left    Family History  Adopted: Yes  Problem Relation Age of Onset  . Heart disease Father   . Colon cancer Neg Hx   . Colon polyps Neg Hx   . Rectal cancer Neg Hx   . Stomach cancer Neg Hx     SOCIAL HISTORY: Social History   Tobacco Use  . Smoking status: Never Smoker  . Smokeless tobacco: Never Used  Substance Use Topics  . Alcohol use: Yes    Comment: occasional    Allergies  Allergen Reactions  . Wasp Venom Anaphylaxis  . Citrus Other (See Comments)    "nasal congestion"    Current Outpatient Medications  Medication Sig Dispense Refill  . Multiple Vitamin  (MULTIVITAMIN WITH MINERALS) TABS Take 1 tablet by mouth every morning.     No current facility-administered medications for this visit.     REVIEW OF SYSTEMS:  [X]  denotes positive finding, [ ]  denotes negative finding Cardiac  Comments:  Chest pain or chest pressure:    Shortness of breath upon exertion:    Short of breath when lying flat:    Irregular heart rhythm:        Vascular    Pain in calf, thigh, or hip brought on by ambulation:    Pain in feet at night that wakes you up from your sleep:     Blood clot in your veins:    Leg swelling:  x       Pulmonary    Oxygen at home:    Productive cough:     Wheezing:         Neurologic    Sudden weakness in arms or legs:     Sudden numbness in arms or legs:     Sudden onset of difficulty speaking or slurred speech:    Temporary loss of vision in one eye:     Problems with dizziness:         Gastrointestinal    Blood in stool:     Vomited blood:  Genitourinary    Burning when urinating:     Blood in urine:        Psychiatric    Major depression:         Hematologic    Bleeding problems:    Problems with blood clotting too easily:        Skin    Rashes or ulcers:        Constitutional    Fever or chills:     PHYSICAL EXAM:   Vitals:   09/08/17 1144  BP: 106/62  Pulse: 74  Resp: 18  SpO2: 96%  Weight: 198 lb 12.8 oz (90.2 kg)  Height: 5\' 9"  (1.753 m)    GENERAL: The patient is a well-nourished male, in no acute distress. The vital signs are documented above. CARDIAC: There is a regular rate and rhythm.  VASCULAR: I do not detect carotid bruits. ARTERIAL: I do not detect carotid bruits.  He has palpable pedal pulses. VENOUS: On the left side he has mild leg swelling.  He has significant large varicose veins that are under high pressure in the medial calf and posterior left calf.  There is some smaller spider veins in the lower leg.  He does not have significant hyperpigmentation. By my SonoSite  exam it looks like I could access the great saphenous vein just below the knee or at the level of the knee.  There is a large varicose vein which originates from the great saphenous vein just below the knee.  The vein is significantly dilated but I do not see any significant problems with tortuosity or with the saphenofemoral junction. On the right side he has some spider veins on the thigh.  He also has some spider veins in the posterior thigh. PULMONARY: There is good air exchange bilaterally without wheezing or rales. ABDOMEN: Soft and non-tender with normal pitched bowel sounds.  MUSCULOSKELETAL: There are no major deformities or cyanosis. NEUROLOGIC: No focal weakness or paresthesias are detected. SKIN: There are no ulcers or rashes noted. PSYCHIATRIC: The patient has a normal affect.  DATA:    VENOUS DUPLEX: I have reviewed his venous duplex scan that was done on 09/06/2017.   On the left side there is no evidence of deep venous thrombosis or superficial thrombophlebitis.  The patient does not have any significant deep venous reflux.  However on the left side he has reflux of the saphenofemoral junction and in the great saphenous vein from the thigh down to the proximal calf.  The vein is significantly dilated.  MEDICAL ISSUES:   CHRONIC VENOUS INSUFFICIENCY: This patient has had worsening symptoms in the left leg associated with his large varicose veins which are being fed by an incompetent left great saphenous vein.  The vein is significantly dilated. He has CEAP clinical class 3 venous disease.  We have discussed conservative treatment including importance of intermittent leg elevation the proper positioning for this.  He has been wearing his compression stockings.  I encouraged him to avoid prolonged sitting and standing.  I encouraged him to exercise.  However given that his symptoms are significantly disabling I think he is a good candidate for endovenous laser ablation of the left great  saphenous vein and stab phlebectomy of large varicose veins of the left leg (10-20 stabs).   I have discussed the indications for endovenous laser ablation of the left GSV, that is to lower the pressure in the veins and potentially help relieve the symptoms from venous hypertension. I have also  discussed alternative options including conservative treatment with leg elevation, compression therapy, and other lifestyle changes. I have discussed the potential complications of the procedure, including, but not limited to: bleeding, bruising, leg swelling, nerve injury, skin burns, significant pain from phlebitis, deep venous thrombosis, or failure of the vein to close. All of the patient's questions were encouraged and answered. They are agreeable to proceed.    Deitra Mayo [facility] [phone]

## 2017-09-09 ENCOUNTER — Other Ambulatory Visit: Payer: Self-pay | Admitting: *Deleted

## 2017-09-09 DIAGNOSIS — I872 Venous insufficiency (chronic) (peripheral): Secondary | ICD-10-CM

## 2017-09-15 ENCOUNTER — Encounter: Payer: Self-pay | Admitting: Physical Therapy

## 2017-09-15 ENCOUNTER — Ambulatory Visit: Payer: Medicare Other | Admitting: Physical Therapy

## 2017-09-15 DIAGNOSIS — M6281 Muscle weakness (generalized): Secondary | ICD-10-CM | POA: Diagnosis not present

## 2017-09-15 DIAGNOSIS — M25511 Pain in right shoulder: Secondary | ICD-10-CM | POA: Diagnosis not present

## 2017-09-15 DIAGNOSIS — G8929 Other chronic pain: Secondary | ICD-10-CM

## 2017-09-15 DIAGNOSIS — M25611 Stiffness of right shoulder, not elsewhere classified: Secondary | ICD-10-CM

## 2017-09-15 NOTE — Therapy (Signed)
[place], [place], [address] Phone: [phone]   Fax:  [phone]  Physical Therapy Treatment  Patient Details  Name: Dean Thompson MRN: [id number] Date of Birth: 12/20/1950 Referring Provider: Joni Fears, MD   Encounter Date: 09/15/2017  PT End of Session - 09/15/17 0930    Visit Number  2    Number of Visits  12    Date for PT Re-Evaluation  10/08/17    PT Start Time  0840    PT Stop Time  0925    PT Time Calculation (min)  45 min    Activity Tolerance  Patient tolerated treatment well    Behavior During Therapy  [facility] for tasks assessed/performed       Past Medical History:  Diagnosis Date  . Allergy   . Hyperlipidemia   . Rotator cuff tear    left    Past Surgical History:  Procedure Laterality Date  . COLONOSCOPY  2004   in [place] and was normal per pt  . CYSTECTOMY  2006   subacious cyst  remove from neck  . ROTATOR CUFF REPAIR Right 1999  . SEPTOPLASTY  1982  . SHOULDER OPEN ROTATOR CUFF REPAIR  10/01/2011   Procedure: ROTATOR CUFF REPAIR SHOULDER OPEN;  Surgeon: Magnus Sinning, MD;  Location: [facility];  Service: Orthopedics;  Laterality: Left;  Left Shoulder Acrominectomy/Open Rotator Cuff Repair/Distal Clavicle Resection  . SPINE SURGERY  2018    There were no vitals filed for this visit.  Subjective Assessment - 09/15/17 0834    Subjective  Reaching behind painful /  stiff. 3/10.  All other exercised doing OK    Currently in Pain?  No/denies    Pain Score  4     Pain Location  Shoulder    Pain Orientation  Right    Pain Descriptors / Indicators  -- feels loike stinking pins    Pain Type  Chronic pain    Aggravating Factors   night when he sleeps.  Pain in day rare.     Pain Relieving Factors  change of position,  pillows    Multiple Pain Sites  No                       [facility] Adult PT Treatment/Exercise - 09/15/17 0001      Self-Care   Self-Care   Posture    Posture  and how it relates to shoulder function      Shoulder Exercises: Supine   Horizontal ABduction  10 reps    Theraband Level (Shoulder Horizontal ABduction)  Level 2 (Red)    External Rotation  10 reps    Theraband Level (Shoulder External Rotation)  Level 2 (Red) both    Flexion  10 reps    Theraband Level (Shoulder Flexion)  Level 2 (Red) narrow grip    Other Supine Exercises  5 LBS protraction 10 X      Shoulder Exercises: Sidelying   Other Sidelying Exercises  Book opener for thoracic mobility chest stretches  10 X each sidelying left more mobile      Shoulder Exercises: Stretch   Corner Stretch Limitations  3 X 30     Other Shoulder Stretches  sleeper stretch for IR  10 X 10 seconds  limited ROM,  HEP     Other Shoulder Stretches  self mobs with towel roll for IR behind back.  ROM improved.  Manual Therapy   Manual therapy comments  posterior joint mobs gr II, II,  distraction ,  scaplar mobs,  fascia softening anteriior shoulder ,  Pec stretch right             PT Education - 09/15/17 0921    Education Details  HEP,  posture    Person(s) Educated  Patient    Methods  Explanation;Demonstration;Tactile cues;Verbal cues;Handout    Comprehension  Verbalized understanding;Returned demonstration       PT Short Term Goals - 09/15/17 1125      PT SHORT TERM GOAL #1   Title  He will be independent with initial HEp     Baseline  independent with exercises issued so far    Time  3    Period  Weeks    Status  On-going      PT SHORT TERM GOAL #2   Title  He will report pain decr 25% or more with sleep    Baseline  not yet improved    Time  3    Period  Weeks    Status  On-going        PT Long Term Goals - 08/30/17 0746      PT LONG TERM GOAL #1   Title  He willbe independnet with all hEP issued    Time  6    Period  Weeks    Status  New      PT LONG TERM GOAL #2   Title  He will report able to sleep 50% improvement of more    Time  6     Period  Weeks    Status  New      PT LONG TERM GOAL #3   Title  He will e able to lift 10 pounds for exercise without incr pain    Time  6    Period  Weeks    Status  New      PT LONG TERM GOAL #4   Title  He will improve RT shoulder IR to equal LT shoulder behind back. to allow for painfree use of Rt arm    Time  6    Period  Weeks    Status  New            Plan - 09/15/17 0934    Clinical Impression Statement  No pain at end of session.  He was able to increase ROM without lasting pain.  Exercise program progressed.  He declined the need for modalities.  patient is out of town next week.    PT Next Visit Plan  Review HEP ,  Manual , modalities,     PT Home Exercise Plan  rockwood red band , ER/IR stretch,  sleeper stretch, self mobilization with rolled towel.     Consulted and Agree with Plan of Care  Patient       Patient will benefit from skilled therapeutic intervention in order to improve the following deficits and impairments:     Visit Diagnosis: Chronic right shoulder pain  Stiffness of right shoulder joint  Muscle weakness (generalized)     Problem List There are no active problems to display for this patient.   Loretta Doutt  PTA 09/15/2017, 11:27 AM  [facility] [address] [place], [place], [address] Phone: [phone]   Fax:  [phone]  Name: Dean Thompson MRN: [id number] Date of Birth: 09-May-1950

## 2017-09-15 NOTE — Patient Instructions (Signed)
Posterior Capsule Sleeper Stretch, Side-Lying    Lie on side, pillow under head, neck in neutral, underside arm in 90-90 of shoulder and elbow flexion with scapula fixed to table. Use other hand to press back of underside arm forward and downward. Keep elbow angle. Hold _10__ seconds.  Repeat _10__ times per session. Do _1-2__ sessions per day. Advanced: Use PNF contract-relax method.  Copyright  VHI. All rights reserved.    Self mobilization Roll towel and place under arm Hold hands behind back Pull right arm across body and down toward floor 10 X 10 seconds 1-2 x day

## 2017-09-20 ENCOUNTER — Encounter: Payer: Medicare Other | Admitting: Physical Therapy

## 2017-09-27 ENCOUNTER — Ambulatory Visit: Payer: Medicare Other | Attending: Orthopaedic Surgery

## 2017-09-27 DIAGNOSIS — M25511 Pain in right shoulder: Secondary | ICD-10-CM | POA: Insufficient documentation

## 2017-09-27 DIAGNOSIS — G8929 Other chronic pain: Secondary | ICD-10-CM

## 2017-09-27 DIAGNOSIS — M6281 Muscle weakness (generalized): Secondary | ICD-10-CM

## 2017-09-27 DIAGNOSIS — M25611 Stiffness of right shoulder, not elsewhere classified: Secondary | ICD-10-CM

## 2017-09-27 NOTE — Therapy (Addendum)
[place], [place], [address] Phone: [phone]   Fax:  [phone]  Physical Therapy Treatment/Discharge  Patient Details  Name: Dean Thompson MRN: [id number] Date of Birth: [birth year] Referring Provider: Joni Fears, MD   Encounter Date: 09/27/2017  PT End of Session - 09/27/17 0712    Visit Number  3    Number of Visits  12    Date for PT Re-Evaluation  10/08/17    Authorization Type  KX at visit 15 , progress note at visit 10    PT Start Time  0705    PT Stop Time  0745    PT Time Calculation (min)  40 min    Activity Tolerance  Patient tolerated treatment well    Behavior During Therapy  [facility] for tasks assessed/performed       Past Medical History:  Diagnosis Date  . Allergy   . Hyperlipidemia   . Rotator cuff tear    left    Past Surgical History:  Procedure Laterality Date  . COLONOSCOPY  2004   in [place] and was normal per pt  . CYSTECTOMY  2006   subacious cyst  remove from neck  . ROTATOR CUFF REPAIR Right 1999  . SEPTOPLASTY  1982  . SHOULDER OPEN ROTATOR CUFF REPAIR  10/01/2011   Procedure: ROTATOR CUFF REPAIR SHOULDER OPEN;  Surgeon: Magnus Sinning, MD;  Location: [facility];  Service: Orthopedics;  Laterality: Left;  Left Shoulder Acrominectomy/Open Rotator Cuff Repair/Distal Clavicle Resection  . SPINE SURGERY  2018    There were no vitals filed for this visit.  Subjective Assessment - 09/27/17 0708    Subjective  Pain is rare now . none today. Only hting can't do is reach behind back. Everything else full.  Not lifting weights as lifting 10 pounds causes discomfort if do to many reps    Currently in Pain?  No/denies         [facility] PT Assessment - 09/27/17 0001      AROM   Right Shoulder Extension  39 Degrees    Right Shoulder Flexion  122 Degrees    Right Shoulder ABduction  123 Degrees    Right Shoulder Internal Rotation  20 Degrees   4 inches compared to LT     Right Shoulder External Rotation  75 Degrees                   [facility] Adult PT Treatment/Exercise - 09/27/17 0001      Shoulder Exercises: Prone   Extension  Right;15 reps    Extension Weight (lbs)  5    Horizontal ABduction 1  Right;15 reps    Horizontal ABduction 1 Weight (lbs)  3    Other Prone Exercises  Row x15 5 pounds      Shoulder Exercises: Sidelying   External Rotation  Right;15 reps    External Rotation Weight (lbs)  3    Other Sidelying Exercises  Book opener for thoracic mobility chest stretches  10 X each      Shoulder Exercises: Standing   Other Standing Exercises  [place] and hor abduction bilat and ER bilat x 15 green band      Shoulder Exercises: Pulleys   Flexion  2 minutes      Shoulder Exercises: ROM/Strengthening   UBE (Upper Arm Bike)  L2  3 min forward /3 min back      Shoulder Exercises: Body Blade   ABduction  30 seconds;2 reps    External Rotation  30 seconds;2 reps               PT Short Term Goals - 09/27/17 0753      PT SHORT TERM GOAL #1   Title  He will be independent with initial HEp     Status  Achieved      PT SHORT TERM GOAL #2   Title  He will report pain decr 25% or more with sleep    Status  On-going        PT Long Term Goals - 08/30/17 0746      PT LONG TERM GOAL #1   Title  He willbe independnet with all hEP issued    Time  6    Period  Weeks    Status  New      PT LONG TERM GOAL #2   Title  He will report able to sleep 50% improvement of more    Time  6    Period  Weeks    Status  New      PT LONG TERM GOAL #3   Title  He will e able to lift 10 pounds for exercise without incr pain    Time  6    Period  Weeks    Status  New      PT LONG TERM GOAL #4   Title  He will improve RT shoulder IR to equal LT shoulder behind back. to allow for painfree use of Rt arm    Time  6    Period  Weeks    Status  New            Plan - 09/27/17 0569    Clinical Impression Statement  He is  doing better with pain but still not able to tolerate lifting weights.   No pain post session . Reach behind back inc 3 inches. others essentially same.     Clinical Impairments Affecting Rehab Potential  previous RTC repair , OA RT shoulder    PT Treatment/Interventions  Dry needling;Manual techniques;Patient/family education;Therapeutic exercise;Therapeutic activities;Cryotherapy;Ultrasound;Moist Heat;Iontophoresis 56m/ml Dexamethasone;Passive range of motion    PT Next Visit Plan  Review HEP ,  Manual , modalities,     PT Home Exercise Plan  rockwood red band , ER/IR stretch,  sleeper stretch, self mobilization with rolled towel. arm openings    Consulted and Agree with Plan of Care  Patient       Patient will benefit from skilled therapeutic intervention in order to improve the following deficits and impairments:  Pain, Increased muscle spasms, Impaired UE functional use, Decreased range of motion, Decreased strength, Decreased activity tolerance  Visit Diagnosis: Chronic right shoulder pain  Stiffness of right shoulder joint  Muscle weakness (generalized)     Problem List There are no active problems to display for this patient.   CDarrel Hoover PT 09/27/2017, 7:54 AM  [facility] [address]Phone: [phone]  Fax:  [phone] Name: Dean WOMBLESMRN: [id number] of Birth: [birth year] PHYSICAL THERAPY DISCHARGE SUMMARY  Visits from Start of Care: 4  Current functional level related to goals / functional outcomes: See above. He was to return in a couple of weeks after return to gym but did not return   Remaining deficits: Unknown   Education / Equipment: HEP Plan:  Patient goals were partially met. Patient is being discharged due to not returning since the last visit.  ?????   Pearson Forster PT 11/01/17

## 2017-10-04 ENCOUNTER — Ambulatory Visit: Payer: Medicare Other

## 2017-10-04 DIAGNOSIS — M6281 Muscle weakness (generalized): Secondary | ICD-10-CM

## 2017-10-04 DIAGNOSIS — G8929 Other chronic pain: Secondary | ICD-10-CM | POA: Diagnosis not present

## 2017-10-04 DIAGNOSIS — M25611 Stiffness of right shoulder, not elsewhere classified: Secondary | ICD-10-CM

## 2017-10-04 DIAGNOSIS — M25511 Pain in right shoulder: Secondary | ICD-10-CM | POA: Diagnosis not present

## 2017-10-04 NOTE — Therapy (Signed)
[place], [place], [address] Phone: [phone]   Fax:  [phone]  Physical Therapy Treatment  Patient Details  Name: Dean Thompson MRN: [id number] Date of Birth: 14-Sep-1950 Referring Provider: Joni Fears, MD   Encounter Date: 10/04/2017  PT End of Session - 10/04/17 0703    Visit Number  4    Number of Visits  12    Date for PT Re-Evaluation  10/08/17    Authorization Type  KX at visit 15 , progress note at visit 10    PT Start Time  0702    PT Stop Time  0742    PT Time Calculation (min)  40 min    Activity Tolerance  Patient tolerated treatment well    Behavior During Therapy  [facility] for tasks assessed/performed       Past Medical History:  Diagnosis Date  . Allergy   . Hyperlipidemia   . Rotator cuff tear    left    Past Surgical History:  Procedure Laterality Date  . COLONOSCOPY  2004   in [place] and was normal per pt  . CYSTECTOMY  2006   subacious cyst  remove from neck  . ROTATOR CUFF REPAIR Right 1999  . SEPTOPLASTY  1982  . SHOULDER OPEN ROTATOR CUFF REPAIR  10/01/2011   Procedure: ROTATOR CUFF REPAIR SHOULDER OPEN;  Surgeon: Magnus Sinning, MD;  Location: [facility];  Service: Orthopedics;  Laterality: Left;  Left Shoulder Acrominectomy/Open Rotator Cuff Repair/Distal Clavicle Resection  . SPINE SURGERY  2018    There were no vitals filed for this visit.  Subjective Assessment - 10/04/17 0711    Subjective  No pain Doing well as far as pain concerned    Currently in Pain?  No/denies                       [facility] Adult PT Treatment/Exercise - 10/04/17 0001      Shoulder Exercises: Supine   Horizontal ABduction  --    Theraband Level (Shoulder Horizontal ABduction)  --    External Rotation  --    Theraband Level (Shoulder External Rotation)  --    Flexion  --    Theraband Level (Shoulder Flexion)  --    Other Supine Exercises  6 LBS protraction 15 X       Shoulder Exercises: Prone   Extension  Right;15 reps    Extension Weight (lbs)  6    Horizontal ABduction 1  Right;15 reps    Horizontal ABduction 1 Weight (lbs)  6    Other Prone Exercises  Row x15 15 pounds      Shoulder Exercises: Sidelying   External Rotation  Right;15 reps    External Rotation Weight (lbs)  4      Shoulder Exercises: Standing   External Rotation  Right;12 reps;Theraband    Theraband Level (Shoulder External Rotation)  Level 3 (Green)    Internal Rotation  Right;12 reps    Theraband Level (Shoulder Internal Rotation)  Level 3 (Green)    Flexion  Right;12 reps    Theraband Level (Shoulder Flexion)  Level 3 (Green)    ABduction  Right;12 reps    Theraband Level (Shoulder ABduction)  Level 3 (Green)    Extension  Right;12 reps    Theraband Level (Shoulder Extension)  Level 3 (Green)      Shoulder Exercises: ROM/Strengthening   UBE (Upper Arm Bike)  L2  3 min forward /3 min back      Shoulder Exercises: IT sales professional Limitations  45 sec RT and LT     Other Shoulder Stretches  behind back with strap stretch x 45 sec    Other Shoulder Stretches  MWM for IR    post REaching behind back not smooth but able to get thumb 2 inches from Lt behind baCK       Shoulder Exercises: Body Blade   ABduction  30 seconds;3 reps    External Rotation  30 seconds;3 reps               PT Short Term Goals - 09/27/17 0753      PT SHORT TERM GOAL #1   Title  He will be independent with initial HEp     Status  Achieved      PT SHORT TERM GOAL #2   Title  He will report pain decr 25% or more with sleep    Status  On-going        PT Long Term Goals - 10/04/17 0752      PT LONG TERM GOAL #1   Title  He will be independent with all hEP issued    Status  On-going      PT LONG TERM GOAL #2   Title  He will report able to sleep 50% improvement of more    Status  Achieved      PT LONG TERM GOAL #3   Title  He will e able to lift 10 pounds for  exercise without incr pain      PT LONG TERM GOAL #4   Title  He will improve RT shoulder IR to equal LT shoulder behind back. to allow for painfree use of Rt arm    Baseline  2 inches less on RT    Status  On-going            Plan - 10/04/17 0703    Clinical Impression Statement  doing wrell and ready to return to gym with machine weights and no over head lifting.  IR ROM 2 inches less behind back  on RT.    Followup in 2 weeks .     Clinical Impairments Affecting Rehab Potential  previous RTC repair , OA RT shoulder    PT Treatment/Interventions  Dry needling;Manual techniques;Patient/family education;Therapeutic exercise;Therapeutic activities;Cryotherapy;Ultrasound;Moist Heat;Iontophoresis 4mg /ml Dexamethasone;Passive range of motion    PT Next Visit Plan  Follow up in 2 weeks and assess need for more PT    PT Home Exercise Plan  rockwood red band , ER/IR stretch,  sleeper stretch, self mobilization with rolled towel. arm openings    Consulted and Agree with Plan of Care  Patient       Patient will benefit from skilled therapeutic intervention in order to improve the following deficits and impairments:  Pain, Increased muscle spasms, Impaired UE functional use, Decreased range of motion, Decreased strength, Decreased activity tolerance  Visit Diagnosis: Chronic right shoulder pain  Stiffness of right shoulder joint  Muscle weakness (generalized)     Problem List There are no active problems to display for this patient.   Darrel Hoover  PT 10/04/2017, 7:53 AM  [facility] [address] [place], [place], [address] Phone: [phone]   Fax:  [phone]  Name: Dean Thompson MRN: [id number] Date of Birth: 1950/11/21

## 2017-10-14 ENCOUNTER — Other Ambulatory Visit: Payer: Medicare Other | Admitting: Vascular Surgery

## 2017-10-21 ENCOUNTER — Ambulatory Visit: Payer: Medicare Other | Admitting: Vascular Surgery

## 2017-10-21 ENCOUNTER — Encounter (HOSPITAL_COMMUNITY): Payer: Medicare Other

## 2017-10-28 ENCOUNTER — Encounter: Payer: Self-pay | Admitting: Vascular Surgery

## 2017-10-28 ENCOUNTER — Ambulatory Visit (INDEPENDENT_AMBULATORY_CARE_PROVIDER_SITE_OTHER): Payer: Medicare Other | Admitting: Vascular Surgery

## 2017-10-28 VITALS — BP 112/67 | HR 81 | Temp 97.6°F | Resp 16 | Ht 70.0 in | Wt 192.0 lb

## 2017-10-28 DIAGNOSIS — I83812 Varicose veins of left lower extremities with pain: Secondary | ICD-10-CM | POA: Diagnosis not present

## 2017-10-28 DIAGNOSIS — I868 Varicose veins of other specified sites: Secondary | ICD-10-CM

## 2017-10-28 NOTE — Progress Notes (Signed)
Laser Ablation Procedure    Date: 10/28/2017   [name] DOB:1950/03/17  Consent signed: Yes    Surgeon:  Dr. Nelda Severe. Kellie Simmering  Procedure: Laser Ablation: left Greater Saphenous Vein  BP 112/67   Pulse 81   Temp 97.6 F (36.4 C)   Resp 16   Ht 5\' 10"  (1.778 m)   Wt 192 lb (87.1 kg)   SpO2 99%   BMI 27.55 kg/m   Tumescent Anesthesia: 520 cc 0.9% NaCl with 50 cc Lidocaine HCL with 1% Epi and 15 cc 8.4% NaHCO3  Local Anesthesia: 7 cc Lidocaine HCL and NaHCO3 (ratio 2:1)  Continuous mode: 15 watts  Total Energy:  2761                         Total Time: 3:04    Stab Phlebectomy: 10-20 Sites: Calf  Patient tolerated procedure well  Notes:   Description of Procedure:  After marking the course of the secondary varicosities, the patient was placed on the operating table in the supine position, and the left leg was prepped and draped in sterile fashion.   Local anesthetic was administered and under ultrasound guidance the saphenous vein was accessed with a micro needle and guide wire; then the mirco puncture sheath was placed.  A guide wire was inserted saphenofemoral junction , followed by a 5 french sheath.  The position of the sheath and then the laser fiber below the junction was confirmed using the ultrasound.  Tumescent anesthesia was administered along the course of the saphenous vein using ultrasound guidance. The patient was placed in Trendelenburg position and protective laser glasses were placed on patient and staff, and the laser was fired at 15 watts continuous mode advancing 1-15mm/second for a total of 2761 joules.   For stab phlebectomies, local anesthetic was administered at the previously marked varicosities, and tumescent anesthesia was administered around the vessels.  Ten to 20 stab wounds were made using the tip of an 11 blade. And using the vein hook, the phlebectomies were performed using a hemostat to avulse the varicosities.  Adequate hemostasis was  achieved.     Steri strips were applied to the stab wounds and ABD pads and thigh high compression stockings were applied.  Ace wrap bandages were applied over the phlebectomy sites and at the top of the saphenofemoral junction. Blood loss was less than 15 cc.  The patient ambulated out of the operating room having tolerated the procedure well.

## 2017-10-28 NOTE — Progress Notes (Signed)
Patient name: Dean Thompson MRN: [id number] DOB: 01-Feb-1951 Sex: male  REASON FOR VISIT:   For endovenous laser ablation left great saphenous vein and 10-20 stab phlebectomies  HPI:   Dean Thompson is a pleasant 67 y.o. male who I last saw on 09/08/2017 with painful varicose vein in the left lower extremity.  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  The patient had significant reflux in the great saphenous vein which was significantly dilated.  He was felt to be a good candidate for endovenous laser ablation of the left great saphenous vein and 10-20 stab phlebectomies.  He has had no new symptoms since I saw him last.  Current Outpatient Medications  Medication Sig Dispense Refill  . Multiple Vitamin (MULTIVITAMIN WITH MINERALS) TABS Take 1 tablet by mouth every morning.     No current facility-administered medications for this visit.     REVIEW OF SYSTEMS:  [X]  denotes positive finding, [ ]  denotes negative finding Vascular    Leg swelling    Cardiac    Chest pain or chest pressure:    Shortness of breath upon exertion:    Short of breath when lying flat:    Irregular heart rhythm:    Constitutional    Fever or chills:     PHYSICAL EXAM:   Vitals:   10/28/17 1022  BP: 112/67  Pulse: 81  Resp: 16  Temp: 97.6 F (36.4 C)  SpO2: 99%  Weight: 192 lb (87.1 kg)  Height: 5\' 10"  (1.778 m)    GENERAL: The patient is a well-nourished male, in no acute distress. The vital signs are documented above.  DATA:   No new data  MEDICAL ISSUES:   ENDOVENOUS LASER ABLATION LEFT GREAT SAPHENOUS VEIN WITH 10-20 STAB PHLEBECTOMIES: The patient was brought to the venous suite and the veins were marked with the patient standing.  I then interrogated the great saphenous vein with the SonoSite and identified the takeoff of a large tributary in the proximal calf.  The left leg was then prepped and draped  in usual sterile fashion.  After the skin was anesthetized with 1% lidocaine I then cannulated the great saphenous vein just below the knee with a micropuncture needle and a micropuncture sheath was introduced over wire.  I then advanced the J-wire up to the saphenofemoral junction proximally 2 cm below that.  The sheath was then advanced over the wire.  The dilator was then removed.  I then used the wire to measure the length of the laser fiber and this was advanced through the sheath and positioned approximately 2-1/2 cm back from the saphenofemoral junction.  The sheath was retracted slightly.  Patient was then placed in Trendelenburg.  I then performed endovenous laser ablation of the left great saphenous vein with 15 W.  A total of 2761 J were used to just below the knee.  Next attention was turned to the stab phlebectomies.  After the skin was anesthetized all these areas were anesthetized with tumescent anesthesia.  Using small stab incisions with an 11 blade the veins were retracted into the wounds with a hook and then gently removed using hemostats using blunt dissection.  Pressure was held for hemostasis.  Next a pressure dressing was applied.  Patient tolerated the procedure well.  He will return in 1 week for follow-up duplex.  Deitra Mayo [facility]  [phone] 

## 2017-11-03 ENCOUNTER — Encounter: Payer: Self-pay | Admitting: Vascular Surgery

## 2017-11-03 ENCOUNTER — Ambulatory Visit (HOSPITAL_COMMUNITY)
Admission: RE | Admit: 2017-11-03 | Discharge: 2017-11-03 | Disposition: A | Discharge status: Home / Self Care | Payer: Medicare Other | Source: Ambulatory Visit | Attending: Vascular Surgery | Admitting: Vascular Surgery

## 2017-11-03 ENCOUNTER — Ambulatory Visit (INDEPENDENT_AMBULATORY_CARE_PROVIDER_SITE_OTHER): Payer: Medicare Other | Admitting: Vascular Surgery

## 2017-11-03 VITALS — BP 120/73 | HR 84 | Temp 97.5°F | Resp 16 | Ht 70.0 in | Wt 194.0 lb

## 2017-11-03 DIAGNOSIS — Z9889 Other specified postprocedural states: Secondary | ICD-10-CM | POA: Diagnosis not present

## 2017-11-03 DIAGNOSIS — I83812 Varicose veins of left lower extremities with pain: Secondary | ICD-10-CM

## 2017-11-03 DIAGNOSIS — I872 Venous insufficiency (chronic) (peripheral): Secondary | ICD-10-CM | POA: Insufficient documentation

## 2017-11-03 NOTE — Progress Notes (Signed)
   Patient name: Dean Thompson MRN: [id number] DOB: [birth year] Sex: male  REASON FOR VISIT:   Follow-up after endovenous laser ablation of the left great saphenous vein and 10-20 stab phlebectomies.  HPI:   MONTAE STAGER is a pleasant 67 y.o. male who presented with painful varicose veins of the left lower extremity.  On 10/28/2017, the patient underwent endovenous laser ablation of the left great saphenous vein with 10-20 stab phlebectomies.  He comes in for a one-week follow-up visit.  The patient is doing well 1 week status post endovenous laser ablation of the left great saphenous vein.  He has no significant symptoms.  He will continue wear his compression stockings for the next week.  Current Outpatient Medications  Medication Sig Dispense Refill  . Multiple Vitamin (MULTIVITAMIN WITH MINERALS) TABS Take 1 tablet by mouth every morning.     No current facility-administered medications for this visit.     REVIEW OF SYSTEMS:  [X]  denotes positive finding, [ ]  denotes negative finding Vascular    Leg swelling    Cardiac    Chest pain or chest pressure:    Shortness of breath upon exertion:    Short of breath when lying flat:    Irregular heart rhythm:    Constitutional    Fever or chills:     PHYSICAL EXAM:   Vitals:   11/03/17 1450  BP: 120/73  Pulse: 84  Resp: 16  Temp: (!) 97.5 F (36.4 C)  SpO2: 99%  Weight: 194 lb (88 kg)  Height: 5\' 10"  (1.778 m)    GENERAL: The patient is a well-nourished male, in no acute distress. The vital signs are documented above. CARDIOVASCULAR: There is a regular rate and rhythm. PULMONARY: There is good air exchange bilaterally without wheezing or rales. His stab incisions are healing nicely.  He has minimal bruising in his medial left thigh.  DATA:   VENOUS DUPLEX: I have independently interpreted his venous duplex scan.  There is no evidence of deep venous thrombosis.  He has had successful closure of the left great saphenous  vein.  The thrombus in the great saphenous vein extends up to the saphenofemoral junction but not into the femoral vein. (EHIT-1).  There is a superficial epigastric clot goes up here I am I made my note here that it was too far so still challenges making sure that is a problem I realize you have to which your ultrasound probe  MEDICAL ISSUES:   STATUS POST ENDOVENOUS LASER ABLATION LEFT GREAT SAPHENOUS VEIN WITH STAB PHLEBECTOMIES: The patient is doing well 1 week postop.  His venous duplex scan looks good with a successful closure no evidence of DVT.  He will return for some scleral on the left ankle.  Otherwise I will plan on seeing him back as needed.  Deitra Mayo [facility] [phone]

## 2017-11-30 ENCOUNTER — Ambulatory Visit: Payer: Medicare Other | Admitting: *Deleted

## 2017-11-30 ENCOUNTER — Ambulatory Visit (INDEPENDENT_AMBULATORY_CARE_PROVIDER_SITE_OTHER): Payer: Medicare Other | Admitting: *Deleted

## 2017-11-30 DIAGNOSIS — I83812 Varicose veins of left lower extremities with pain: Secondary | ICD-10-CM | POA: Diagnosis not present

## 2017-11-30 NOTE — Progress Notes (Signed)
X=.3% Sotradecol administered with a 27g butterfly.  Patient received a total of 18cc.  Treated all areas of concern. Easy access. Tol well. Anticipate good results. Stab phleb sites healing well. Follow prn.  Photos: Yes.    Compression stockings applied: Yes.

## 2018-03-09 ENCOUNTER — Encounter: Payer: Self-pay | Admitting: Family Medicine

## 2018-03-09 ENCOUNTER — Ambulatory Visit (INDEPENDENT_AMBULATORY_CARE_PROVIDER_SITE_OTHER): Payer: Medicare Other | Admitting: Family Medicine

## 2018-03-09 VITALS — BP 124/78 | HR 70 | Temp 97.8°F | Wt 198.5 lb

## 2018-03-09 DIAGNOSIS — F4321 Adjustment disorder with depressed mood: Secondary | ICD-10-CM

## 2018-03-09 MED ORDER — LORAZEPAM 0.5 MG PO TABS: 0.5000 mg | ORAL_TABLET | Freq: Four times a day (QID) | ORAL | 0 refills | Status: DC | PRN

## 2018-03-09 MED ORDER — ESCITALOPRAM OXALATE 10 MG PO TABS: 10.0000 mg | ORAL_TABLET | Freq: Every day | ORAL | 0 refills | Status: DC

## 2018-03-09 NOTE — Progress Notes (Signed)
   Subjective:    Patient ID: Dean Thompson, male    DOB: 1950-06-06, 68 y.o.   MRN: [id number]  HPI Here with his daughter asking for help to deal with the sudden death of his girlfriend 3 weeks ago. He and the girlfriend have been together for 28 years and they have lived together the past 5 years. 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 On 02-22-18 while she was standing beside a photocopy machine she suddenly slumped to the floor. EMS was called but she could not be revived. Javier was out of town when this happened but of course he came home immediately. No cause of death is known. He has been struggling since then with depression, anxiety, and grief. He has been crying a lot, his appetite is down, and he cannot sleep. He has been taking up to 30 mg of melatonin OTC for sleep without success. He admits to some suicidal thoughts but he denies any plans or intent. He had a gun in the house but the family has removed this and locked it away. His daughter Junie Panning is spending one week with him from [place], but she needs to return home this weekend. They have been in touch with Palliative Care to arrange for some counselors to talk to him. A family member has been with him almost all the time in the past few weeks. He has been able to return to work, and he finds this helpful.    Review of Systems  Constitutional: Negative.   Respiratory: Negative.   Cardiovascular: Negative.   Psychiatric/Behavioral: Positive for decreased concentration, dysphoric mood, sleep disturbance and suicidal ideas. Negative for agitation, confusion, hallucinations and self-injury. The patient is nervous/anxious.        Objective:   Physical Exam Constitutional:      Appearance: Normal appearance.  Cardiovascular:     Rate and Rhythm: Normal rate and regular rhythm.     Pulses: Normal pulses.     Heart sounds: Normal heart sounds.  Pulmonary:     Effort: Pulmonary effort is normal.     Breath sounds:  Normal breath sounds.  Neurological:     Mental Status: He is alert.  Psychiatric:     Comments: He is quite upset, crying frequently. Eye contact is good            Assessment & Plan:  Grief reaction. We spent about 50 minutes together discussing this situation. My first priority was his safety and his daughter agreed to arrange for someone to stay with him at all times for the next week or two. Fordyce promised me he would call 911 and get help if he ever felt close to hurting himself. We will start him on Lexapro 10 mg daily and Lorazepam 0.5 mg to use as needed. He will work with Palliative care to get into a grief counseling group. I advised him to exercise daily. He will follow up with [name], his PCP, next week.  Alysia Penna, MD

## 2018-03-16 ENCOUNTER — Ambulatory Visit (INDEPENDENT_AMBULATORY_CARE_PROVIDER_SITE_OTHER): Payer: Medicare Other | Admitting: Adult Health

## 2018-03-16 ENCOUNTER — Encounter: Payer: Self-pay | Admitting: Adult Health

## 2018-03-16 VITALS — BP 118/70 | Temp 97.6°F | Wt 194.0 lb

## 2018-03-16 DIAGNOSIS — F4321 Adjustment disorder with depressed mood: Secondary | ICD-10-CM | POA: Diagnosis not present

## 2018-03-16 NOTE — Progress Notes (Signed)
Subjective:    Patient ID: Dean Thompson, male    DOB: May 11, 1950, 68 y.o.   MRN: [id number]  HPI  68 year old male who  has a past medical history of Allergy, Hyperlipidemia, and Rotator cuff tear.  He presents to the office today for follow up.  He was seen by Dr. Sarajane Jews 1 week ago after the sudden death of girlfriend.  He and his girlfriend had been together for 28 years and they have been living together for the past 5 years.  On February 22, 2018 while she was standing beside a cup photocopy machine she suddenly slumped on the floor.  EMS was called but she could not be revived.  Unfortunately, Taim was out of town when this happened but came home immediately.  Dr. Sarajane Jews had started him on Lexapro 10 mg daily and Ativan 0.5 mg as needed.  Reports that he starts grief counseling with palliative care on February 4.  His daughter from [place] stayed with him for about a week and he has another daughter in [place] has been checking in on him as well as neighbors.  Reports that he continues to be devastated and is struggling with the death.  He continues to cry a lot, his appetite continues to be decreased, and he cannot sleep.  He is taking Ativan at night to help him sleep.  Continues to admit to some suicidal thoughts but denies any plans or intent.  He no longer has a gun in the house  Also has returned to work and finds is helpful to keep his mind off everything else.  Review of Systems See HPI   Past Medical History:  Diagnosis Date  . Allergy   . Hyperlipidemia   . Rotator cuff tear    left    Social History   Socioeconomic History  . Marital status: Divorced    Spouse name: Not on file  . Number of children: Not on file  . Years of education: Not on file  . Highest education level: Not on file  Occupational History  . Not on file  Social Needs  . Financial resource strain: Not on file  . Food insecurity:    Worry: Not on file    Inability: Not on file  .  Transportation needs:    Medical: Not on file    Non-medical: Not on file  Tobacco Use  . Smoking status: Never Smoker  . Smokeless tobacco: Never Used  Substance and Sexual Activity  . Alcohol use: Yes    Comment: occasional  . Drug use: Never  . Sexual activity: Not on file  Lifestyle  . Physical activity:    Days per week: Not on file    Minutes per session: Not on file  . Stress: Not on file  Relationships  . Social connections:    Talks on phone: Not on file    Gets together: Not on file    Attends religious service: Not on file    Active member of club or organization: Not on file    Attends meetings of clubs or organizations: Not on file    Relationship status: Not on file  . Intimate partner violence:    Fear of current or ex partner: Not on file    Emotionally abused: Not on file    Physically abused: Not on file    Forced sexual activity: Not on file  Other Topics Concern  . 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        Past Surgical History:  Procedure Laterality Date  . COLONOSCOPY  2004   in [place] and was normal per pt  . CYSTECTOMY  2006   subacious cyst  remove from neck  . ROTATOR CUFF REPAIR Right 1999  . SEPTOPLASTY  1982  . SHOULDER OPEN ROTATOR CUFF REPAIR  10/01/2011   Procedure: ROTATOR CUFF REPAIR SHOULDER OPEN;  Surgeon: Magnus Sinning, MD;  Location: [facility];  Service: Orthopedics;  Laterality: Left;  Left Shoulder Acrominectomy/Open Rotator Cuff Repair/Distal Clavicle Resection  . SPINE SURGERY  2018    Family History  Adopted: Yes  Problem Relation Age of Onset  . Heart disease Father   . Colon cancer Neg Hx   . Colon polyps Neg Hx   . Rectal cancer Neg Hx   . Stomach cancer Neg Hx     Allergies  Allergen Reactions  . Wasp Venom Anaphylaxis  . Citrus Other (See Comments)    "nasal congestion"     Current Outpatient Medications on File Prior to Visit  Medication Sig Dispense Refill  . escitalopram (LEXAPRO) 10 MG tablet Take 1 tablet (10 mg total) by mouth daily. 30 tablet 0  . LORazepam (ATIVAN) 0.5 MG tablet Take 1 tablet (0.5 mg total) by mouth every 6 (six) hours as needed for anxiety. 60 tablet 0  . Multiple Vitamin (MULTIVITAMIN WITH MINERALS) TABS Take 1 tablet by mouth every morning.     No current facility-administered medications on file prior to visit.     BP 118/70   Temp 97.6 F (36.4 C)   Wt 194 lb (88 kg)   BMI 27.84 kg/m       Objective:   Physical Exam Vitals signs and nursing note reviewed.  Constitutional:      Appearance: Normal appearance.  Skin:    General: Skin is warm and dry.     Capillary Refill: Capillary refill takes less than 2 seconds.  Neurological:     Mental Status: He is alert and oriented to person, place, and time.  Psychiatric:        Mood and Affect: Mood is depressed. Affect is flat and tearful.        Speech: Speech normal.        Behavior: Behavior is slowed. Behavior is cooperative.        Thought Content: Thought content normal.        Cognition and Memory: Cognition normal.        Judgment: Judgment normal.     Comments: Crying during much of exam. Good eye contact       Assessment & Plan:  1. Grief reaction -Continue with Lexapro 10 mg and Ativan 0.5 mg as needed.  Was advised that if he has any aunts to hurt himself that he needs to call 911, promised to do so.  List of local psychiatrist given, and I am glad that he has a recent appointment with grief counseling.  We will have him follow-up in about 3 weeks or sooner if needed  [name], NP

## 2018-04-12 ENCOUNTER — Ambulatory Visit (INDEPENDENT_AMBULATORY_CARE_PROVIDER_SITE_OTHER): Payer: Medicare Other | Admitting: Adult Health

## 2018-04-12 ENCOUNTER — Encounter: Payer: Self-pay | Admitting: Adult Health

## 2018-04-12 VITALS — BP 120/70 | Temp 98.0°F | Wt 196.0 lb

## 2018-04-12 DIAGNOSIS — L570 Actinic keratosis: Secondary | ICD-10-CM

## 2018-04-12 DIAGNOSIS — F4321 Adjustment disorder with depressed mood: Secondary | ICD-10-CM

## 2018-04-12 MED ORDER — LORAZEPAM 0.5 MG PO TABS: 0.5000 mg | ORAL_TABLET | Freq: Four times a day (QID) | ORAL | 1 refills | Status: DC | PRN

## 2018-04-12 MED ORDER — ESCITALOPRAM OXALATE 20 MG PO TABS: 20.0000 mg | ORAL_TABLET | Freq: Every day | ORAL | 1 refills | Status: DC

## 2018-04-12 NOTE — Progress Notes (Addendum)
Subjective:    Patient ID: Dean Thompson, male    DOB: 1950/02/26, 68 y.o.   MRN: [id number]  HPI  68 year old male who  has a past medical history of Allergy, Hyperlipidemia, and Rotator cuff tear.  He presents to the office today for one month follow up of anxiety and depression from the unexpected death of his girlfriend of 28 years. He was started on Lexapro 10 mg and Ativan 0.5 mg. He reports that he has not noticed much benefit from Lexapro but does get benefit from Ativan for anxiety when he takes it. He tries to take it as less as possible. He is going to therapy which he finds helpful.   He also has a skin growth on his forehead that he would like frozen off.   Review of Systems See HPI   Past Medical History:  Diagnosis Date  . Allergy   . Hyperlipidemia   . Rotator cuff tear    left    Social History   Socioeconomic History  . Marital status: Divorced    Spouse name: Not on file  . Number of children: Not on file  . Years of education: Not on file  . Highest education level: Not on file  Occupational History  . Not on file  Social Needs  . Financial resource strain: Not on file  . Food insecurity:    Worry: Not on file    Inability: Not on file  . Transportation needs:    Medical: Not on file    Non-medical: Not on file  Tobacco Use  . Smoking status: Never Smoker  . Smokeless tobacco: Never Used  Substance and Sexual Activity  . Alcohol use: Yes    Comment: occasional  . Drug use: Never  . Sexual activity: Not on file  Lifestyle  . Physical activity:    Days per week: Not on file    Minutes per session: Not on file  . Stress: Not on file  Relationships  . Social connections:    Talks on phone: Not on file    Gets together: Not on file    Attends religious service: Not on file    Active member of club or organization: Not on file    Attends meetings of clubs or organizations: Not on file    Relationship status: Not on file  . Intimate  partner violence:    Fear of current or ex partner: Not on file    Emotionally abused: Not on file    Physically abused: Not on file    Forced sexual activity: Not on file  Other Topics Concern  . 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        Past Surgical History:  Procedure Laterality Date  . COLONOSCOPY  2004   in [place] and was normal per pt  . CYSTECTOMY  2006   subacious cyst  remove from neck  . ROTATOR CUFF REPAIR Right 1999  . SEPTOPLASTY  1982  . SHOULDER OPEN ROTATOR CUFF REPAIR  10/01/2011   Procedure: ROTATOR CUFF REPAIR SHOULDER OPEN;  Surgeon: Magnus Sinning, MD;  Location: [facility];  Service: Orthopedics;  Laterality: Left;  Left Shoulder Acrominectomy/Open Rotator Cuff Repair/Distal Clavicle Resection  . [place]  2018  Family History  Adopted: Yes  Problem Relation Age of Onset  . Heart disease Father   . Colon cancer Neg Hx   . Colon polyps Neg Hx   . Rectal cancer Neg Hx   . Stomach cancer Neg Hx     Allergies  Allergen Reactions  . Wasp Venom Anaphylaxis  . Citrus Other (See Comments)    "nasal congestion"    Current Outpatient Medications on File Prior to Visit  Medication Sig Dispense Refill  . escitalopram (LEXAPRO) 10 MG tablet Take 1 tablet (10 mg total) by mouth daily. 30 tablet 0  . LORazepam (ATIVAN) 0.5 MG tablet Take 1 tablet (0.5 mg total) by mouth every 6 (six) hours as needed for anxiety. 60 tablet 0  . Multiple Vitamin (MULTIVITAMIN WITH MINERALS) TABS Take 1 tablet by mouth every morning.     No current facility-administered medications on file prior to visit.     BP 120/70   Temp 98 F (36.7 C)   Wt 196 lb (88.9 kg)   BMI 28.12 kg/m       Objective:   Physical Exam Vitals signs and nursing note reviewed.  Constitutional:      Appearance: Normal appearance.  Skin:    General:  Skin is warm and dry.     Comments: AK noted above right eyebrow   Neurological:     General: No focal deficit present.     Mental Status: He is alert and oriented to person, place, and time.       Assessment & Plan:  1. Grief reaction - Will increase Lexapro to 20 mg daily  - Follow up if no improvement  - Go to the ER with any thoughts SI  - escitalopram (LEXAPRO) 20 MG tablet; Take 1 tablet (20 mg total) by mouth daily.  Dispense: 90 tablet; Refill: 1 - LORazepam (ATIVAN) 0.5 MG tablet; Take 1 tablet (0.5 mg total) by mouth every 6 (six) hours as needed for anxiety.  Dispense: 60 tablet; Refill: 1  2. AK (actinic keratosis) - Informed consent was obtained cryotherapy was performed. Patient tolerated procedure well     [name], NP

## 2018-08-17 IMAGING — CT CT L SPINE W/O CM
4 of 6 series · 16 of 33 positions shown, 18 images · non-contrast
Comparison: None.

CLINICAL DATA: Back pain for 2 weeks, worsening for 2 days

EXAM:
CT LUMBAR SPINE WITHOUT CONTRAST
TECHNIQUE: Multidetector CT imaging of the lumbar spine was performed without
intravenous contrast administration. Multiplanar CT image
reconstructions were also generated.

[Series 4: l spine soft · axial · 0.33mm/px · z∈[-378,-212]mm · 5 of 125 slices shown]
[im 21/125  soft-tissue]
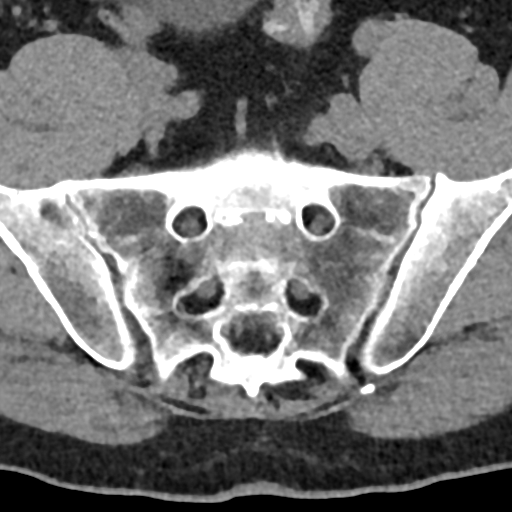
[im 42/125  soft-tissue]
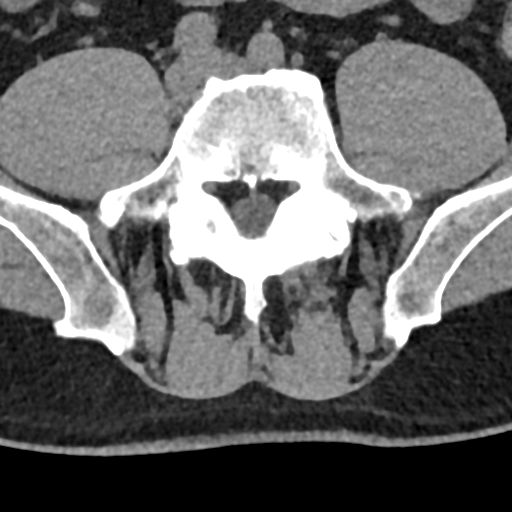
[im 63/125  soft-tissue]
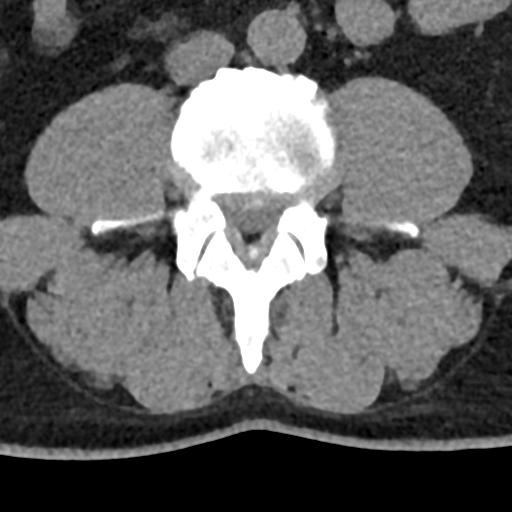
[im 83/125  soft-tissue]
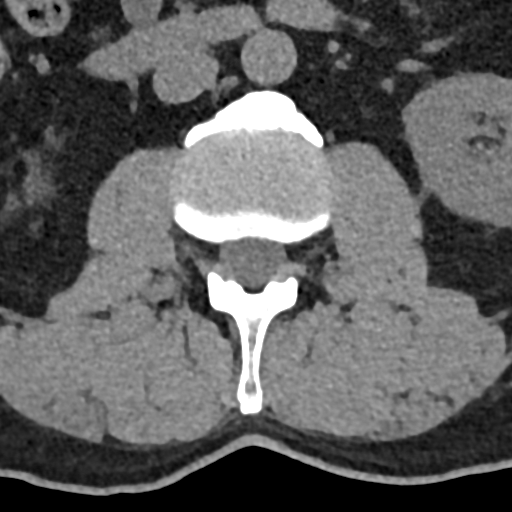
[im 104/125  soft-tissue]
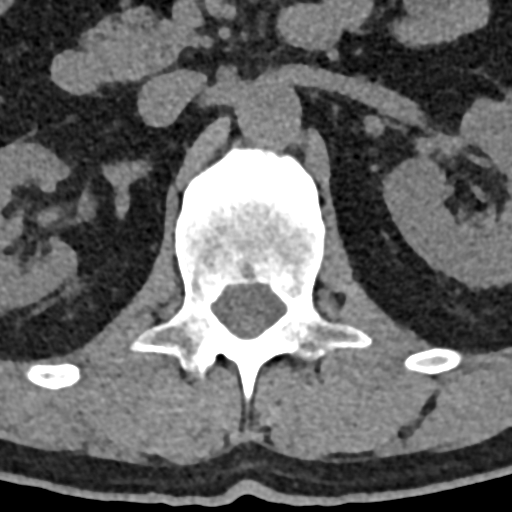

[Series 6: cor bone · coronal · 0.37mm/px · 1 of 76 slices shown]
[im 38/76  bone]
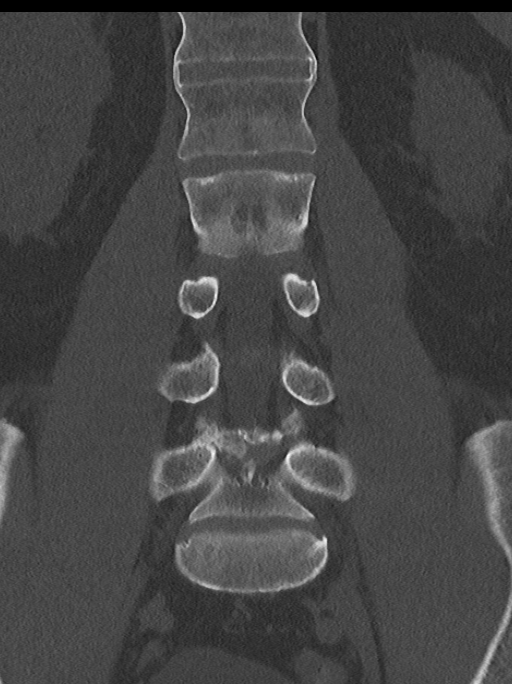

[Series 8: sag st · sagittal · 0.41mm/px · 5 of 90 slices shown]
[im 13/90  bone]
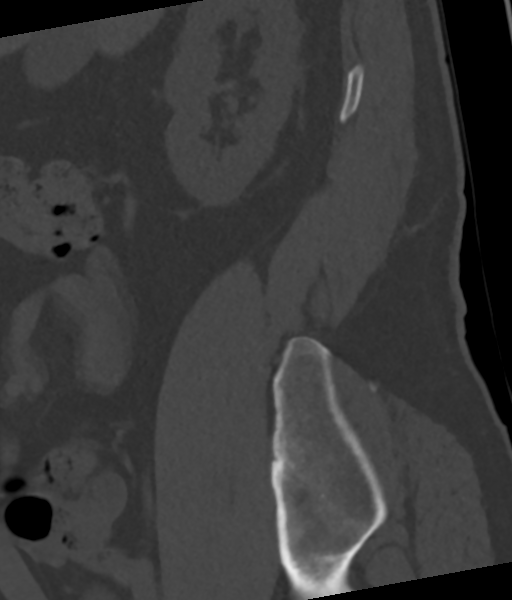
[im 26/90  bone]
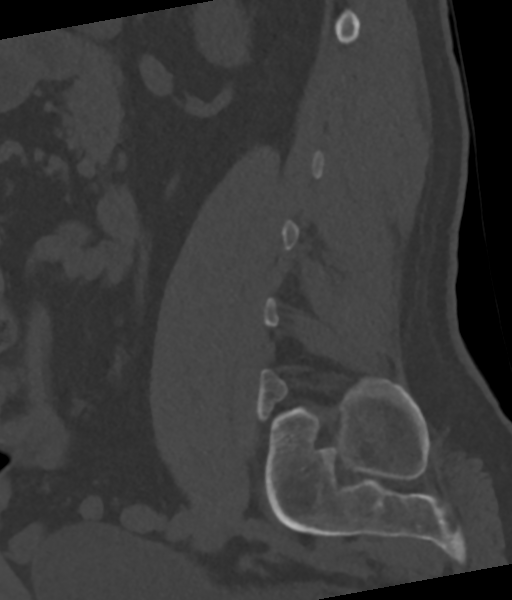
[im 39/90  bone]
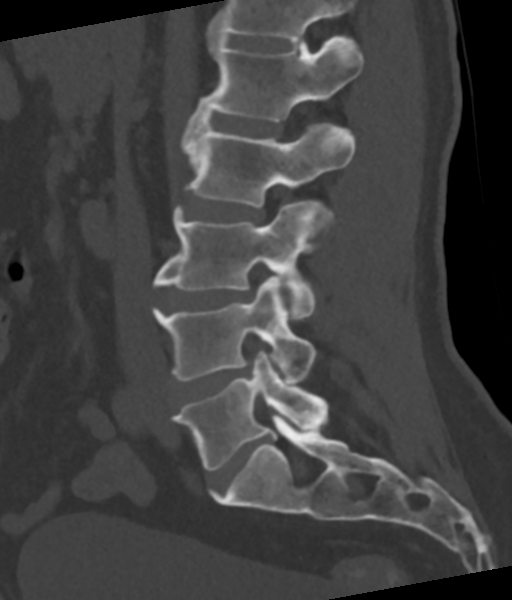
[im 51/90  bone]
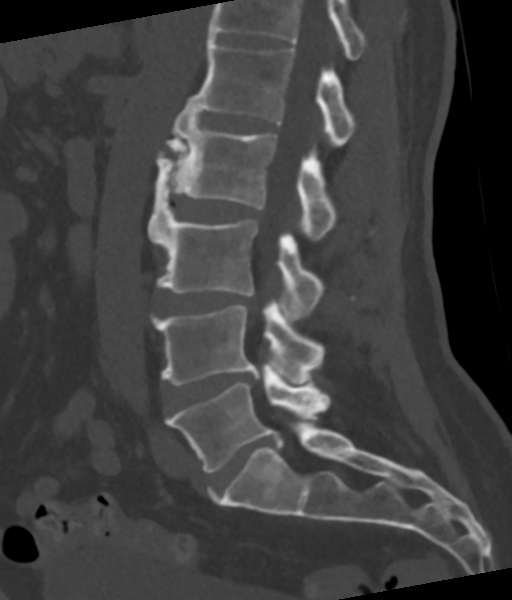
[im 64/90  bone]
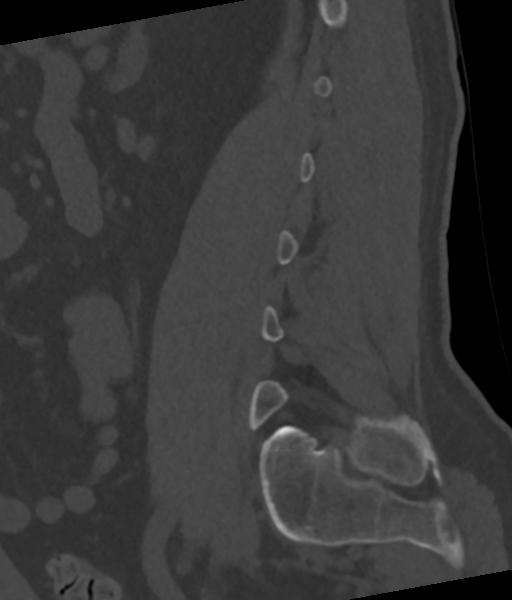

[Series 10: orthogonal · axial · 0.21mm/px · z∈[-373,-197]mm · 5 of 118 slices shown, 7 images]
[im 20/118  soft-tissue]
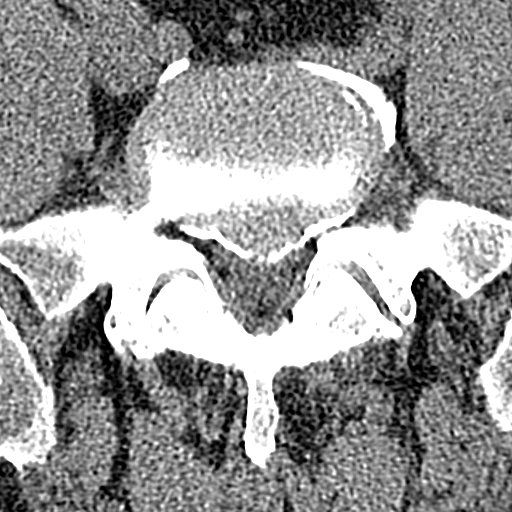
[im 20/118  bone]
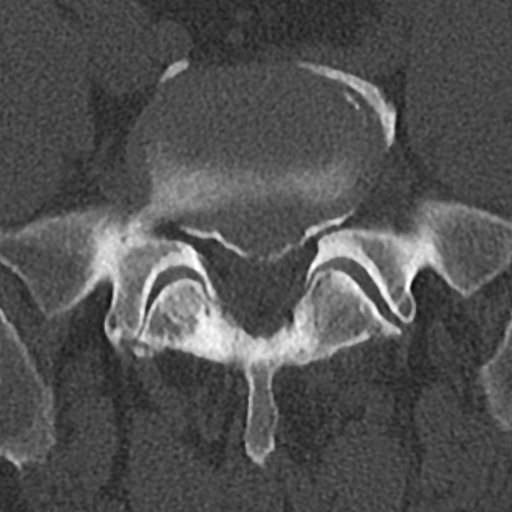
[im 40/118  bone]
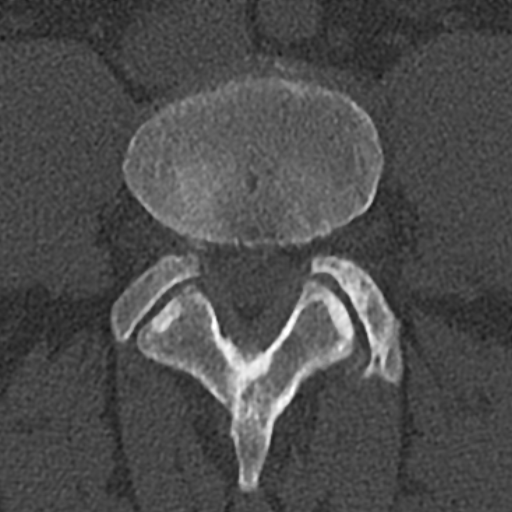
[im 59/118  bone]
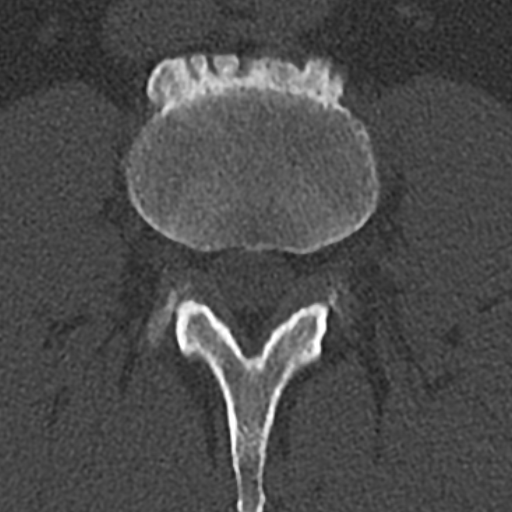
[im 79/118  bone]
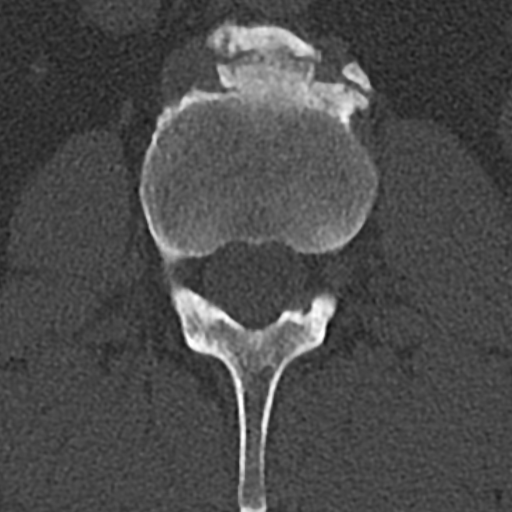
[im 98/118  soft-tissue]
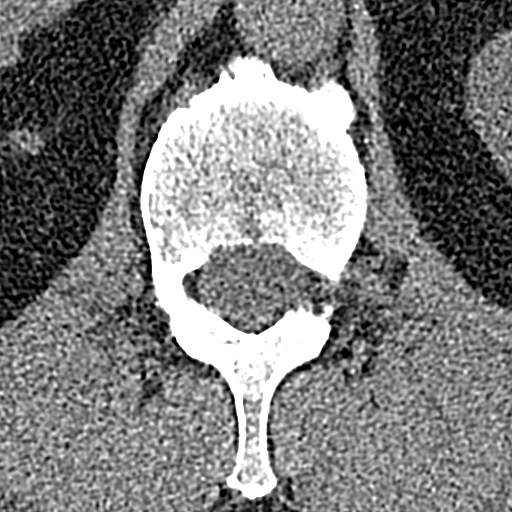
[im 98/118  bone]
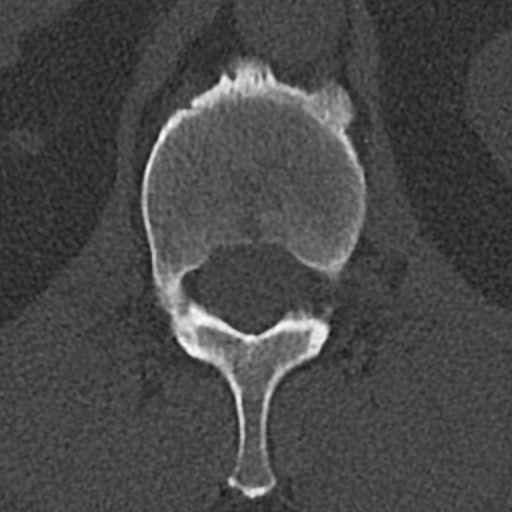

[16 of 33 positions shown; findings below may reference images not displayed]

FINDINGS: Segmentation: 5 lumbar type vertebrae.

Alignment: Mild retrolisthesis at L5-S1.

Vertebrae: No acute fracture or focal pathologic process.

Paraspinal and other soft tissues: Negative.

Disc levels:

T12- L1: Ankylosis by an osteophyte.  No impingement.

L1-L2: Ankylosis by ventral osteophyte. Mild facet spurring. No
impingement

L2-L3: Bulky ventral spondylotic spurring. No herniation or
impingement.

L3-L4: Spondylosis with disc narrowing and bulging. Interspinous
degenerative sclerosis and irregularity. Moderate spinal stenosis.

L4-L5: Disc narrowing with posterior annular ossification and left
paracentral disc protrusion. Facet arthropathy with bulky asymmetric
left hypertrophy. Advanced spinal stenosis. Noncompressive bilateral
foraminal narrowing.

L5-S1:Disc narrowing with posterior annular ridging contacting the
descending S1 nerve roots. Patent foramina.
IMPRESSION: 1. No acute osseous finding.
2. Spondylosis and degenerative disc bulging. Focal advanced facet
arthropathy on the left at L4-5.
3. L4-5 advanced spinal stenosis due to endplate ridging and left
paracentral disc protrusion superimposed on hypertrophic facets.
4. L3-4 moderate spinal stenosis.
5. L5-S1 endplate spurring contacts but does not compress the
descending S1 nerve roots.

## 2018-09-15 ENCOUNTER — Other Ambulatory Visit: Payer: Self-pay

## 2018-10-13 ENCOUNTER — Ambulatory Visit (INDEPENDENT_AMBULATORY_CARE_PROVIDER_SITE_OTHER): Payer: Medicare Other | Admitting: Adult Health

## 2018-10-13 ENCOUNTER — Encounter: Payer: Self-pay | Admitting: Adult Health

## 2018-10-13 ENCOUNTER — Other Ambulatory Visit: Payer: Self-pay

## 2018-10-13 VITALS — BP 108/70 | Temp 97.8°F | Ht 69.75 in | Wt 199.0 lb

## 2018-10-13 DIAGNOSIS — N400 Enlarged prostate without lower urinary tract symptoms: Secondary | ICD-10-CM

## 2018-10-13 DIAGNOSIS — F419 Anxiety disorder, unspecified: Secondary | ICD-10-CM

## 2018-10-13 DIAGNOSIS — E782 Mixed hyperlipidemia: Secondary | ICD-10-CM | POA: Diagnosis not present

## 2018-10-13 DIAGNOSIS — R5383 Other fatigue: Secondary | ICD-10-CM

## 2018-10-13 DIAGNOSIS — F32A Depression, unspecified: Secondary | ICD-10-CM

## 2018-10-13 DIAGNOSIS — F329 Major depressive disorder, single episode, unspecified: Secondary | ICD-10-CM

## 2018-10-13 LAB — COMPREHENSIVE METABOLIC PANEL
ALT: 19 U/L (ref 0–53)
AST: 19 U/L (ref 0–37)
Albumin: 4.6 g/dL (ref 3.5–5.2)
Alkaline Phosphatase: 95 U/L (ref 39–117)
BUN: 14 mg/dL (ref 6–23)
CO2: 28 mEq/L (ref 19–32)
Calcium: 9.2 mg/dL (ref 8.4–10.5)
Chloride: 103 mEq/L (ref 96–112)
Creatinine, Ser: 1.25 mg/dL (ref 0.40–1.50)
GFR: 57.37 mL/min — ABNORMAL LOW (ref >60.00)
Glucose, Bld: 91 mg/dL (ref 70–99)
Potassium: 4.6 mEq/L (ref 3.5–5.1)
Sodium: 139 mEq/L (ref 135–145)
Total Bilirubin: 1.8 mg/dL — ABNORMAL HIGH (ref 0.2–1.2)
Total Protein: 6.8 g/dL (ref 6.0–8.3)

## 2018-10-13 LAB — LIPID PANEL
Cholesterol: 171 mg/dL (ref 0–200)
HDL: 32.2 mg/dL — ABNORMAL LOW (ref >39.00)
NonHDL: 139.1
Total CHOL/HDL Ratio: 5
Triglycerides: 209 mg/dL — ABNORMAL HIGH (ref 0.0–149.0)
VLDL: 41.8 mg/dL — ABNORMAL HIGH (ref 0.0–40.0)

## 2018-10-13 LAB — CBC WITH DIFFERENTIAL/PLATELET
Basophils Absolute: 0 10*3/uL (ref 0.0–0.1)
Basophils Relative: 0.2 % (ref 0.0–3.0)
Eosinophils Absolute: 0.2 10*3/uL (ref 0.0–0.7)
Eosinophils Relative: 3.4 % (ref 0.0–5.0)
HCT: 47.5 % (ref 39.0–52.0)
Hemoglobin: 16.6 g/dL (ref 13.0–17.0)
Lymphocytes Relative: 24.2 % (ref 12.0–46.0)
Lymphs Abs: 1.4 10*3/uL (ref 0.7–4.0)
MCHC: 34.9 g/dL (ref 30.0–36.0)
MCV: 92 fl (ref 78.0–100.0)
Monocytes Absolute: 0.4 10*3/uL (ref 0.1–1.0)
Monocytes Relative: 7.9 % (ref 3.0–12.0)
Neutro Abs: 3.7 10*3/uL (ref 1.4–7.7)
Neutrophils Relative %: 64.3 % (ref 43.0–77.0)
Platelets: 137 10*3/uL — ABNORMAL LOW (ref 150.0–400.0)
RBC: 5.16 Mil/uL (ref 4.22–5.81)
RDW: 13.4 % (ref 11.5–15.5)
WBC: 5.7 10*3/uL (ref 4.0–10.5)

## 2018-10-13 LAB — IBC + FERRITIN
Ferritin: 150.8 ng/mL (ref 22.0–322.0)
Iron: 115 ug/dL (ref 42–165)
Saturation Ratios: 33.4 % (ref 20.0–50.0)
Transferrin: 246 mg/dL (ref 212.0–360.0)

## 2018-10-13 LAB — VITAMIN B12: Vitamin B-12: 469 pg/mL (ref 211–911)

## 2018-10-13 LAB — VITAMIN D 25 HYDROXY (VIT D DEFICIENCY, FRACTURES): VITD: 81.66 ng/mL (ref 30.00–100.00)

## 2018-10-13 LAB — LDL CHOLESTEROL, DIRECT: Direct LDL: 109 mg/dL

## 2018-10-13 LAB — TSH: TSH: 1.8 u[IU]/mL (ref 0.35–4.50)

## 2018-10-13 LAB — PSA: PSA: 1.73 ng/mL (ref 0.10–4.00)

## 2018-10-13 NOTE — Progress Notes (Signed)
Subjective:    Patient ID: Dean Thompson, male    DOB: 01-30-1951, 68 y.o.   MRN: [id number]  HPI  Patient presents for yearly preventative medicine examination. He is a pleasant 68 year old male who  has a past medical history of Allergy, Hyperlipidemia, and Rotator cuff tear.  Anxiety/Depression - was prescribed Lexapro back in Jan/Feb for anxiety and depression d/t sudden passing of long term partner. He reports that he stopped taking this medication a few months ago as he felt as though he did not need it any longer. He denies anxiety or depression at this time   Fatigue - this is his biggest issues today, has been present for about 6 months. He denies waking up feeling fatigued and reports that he sleeps well. As the day continues he starts to feel more fatigued. He is exercising and eating healthy. He enjoys running. He denies chest pain, SOB, fevers, chills, or night sweats. He is taking Vitamin D 3000 units daily.   Hyperlipidemia - Not currently on medication Lab Results  Component Value Date   CHOL 180 09/01/2017   HDL 32.80 (L) 09/01/2017   LDLCALC 120 (H) 10/09/2015   LDLDIRECT 115.0 09/01/2017   TRIG 220.0 (H) 09/01/2017   CHOLHDL 5 09/01/2017    All immunizations and health maintenance protocols were reviewed with the patient and needed orders were placed.  Appropriate screening laboratory values were ordered for the patient including screening of hyperlipidemia, renal function and hepatic function. If indicated by BPH, a PSA was ordered.  Medication reconciliation,  past medical history, social history, problem list and allergies were reviewed in detail with the patient  Goals were established with regard to weight loss, exercise, and  diet in compliance with medications Wt Readings from Last 3 Encounters:  10/13/18 199 lb (90.3 kg)  04/12/18 196 lb (88.9 kg)  03/16/18 194 lb (88 kg)   End of life planning was discussed.  He is up-to-date on routine screening  colonoscopy as well as dental and vision screens.  Review of Systems  Constitutional: Positive for fatigue.  HENT: Negative.   Eyes: Negative.   Respiratory: Negative.   Cardiovascular: Negative.   Gastrointestinal: Negative.   Endocrine: Negative.   Genitourinary: Negative.   Musculoskeletal: Negative.   Skin: Negative.   Allergic/Immunologic: Negative.   Neurological: Negative.   Hematological: Negative.   Psychiatric/Behavioral: Negative.   All other systems reviewed and are negative.  Past Medical History:  Diagnosis Date  . Allergy   . Hyperlipidemia   . Rotator cuff tear    left    Social History   Socioeconomic History  . Marital status: Divorced    Spouse name: Not on file  . Number of children: Not on file  . Years of education: Not on file  . Highest education level: Not on file  Occupational History  . Not on file  Social Needs  . Financial resource strain: Not on file  . Food insecurity    Worry: Not on file    Inability: Not on file  . Transportation needs    Medical: Not on file    Non-medical: Not on file  Tobacco Use  . Smoking status: Never Smoker  . Smokeless tobacco: Never Used  Substance and Sexual Activity  . Alcohol use: Yes    Comment: occasional  . Drug use: Never  . Sexual activity: Not on file  Lifestyle  . Physical activity    Days per week: Not on  file    Minutes per session: Not on file  . Stress: Not on file  Relationships  . Social Herbalist on phone: Not on file    Gets together: Not on file    Attends religious service: Not on file    Active member of club or organization: Not on file    Attends meetings of clubs or organizations: Not on file    Relationship status: Not on file  . Intimate partner violence    Fear of current or ex partner: Not on file    Emotionally abused: Not on file    Physically abused: Not on file    Forced sexual activity: Not on file  Other Topics Concern  . 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        Past Surgical History:  Procedure Laterality Date  . COLONOSCOPY  2004   in [place] and was normal per pt  . CYSTECTOMY  2006   subacious cyst  remove from neck  . ROTATOR CUFF REPAIR Right 1999  . SEPTOPLASTY  1982  . SHOULDER OPEN ROTATOR CUFF REPAIR  10/01/2011   Procedure: ROTATOR CUFF REPAIR SHOULDER OPEN;  Surgeon: Magnus Sinning, MD;  Location: [facility];  Service: Orthopedics;  Laterality: Left;  Left Shoulder Acrominectomy/Open Rotator Cuff Repair/Distal Clavicle Resection  . SPINE SURGERY  2018    Family History  Adopted: Yes  Problem Relation Age of Onset  . Heart disease Father   . Colon cancer Neg Hx   . Colon polyps Neg Hx   . Rectal cancer Neg Hx   . Stomach cancer Neg Hx     Allergies  Allergen Reactions  . Wasp Venom Anaphylaxis  . Citrus Other (See Comments)    "nasal congestion"    Current Outpatient Medications on File Prior to Visit  Medication Sig Dispense Refill  . escitalopram (LEXAPRO) 20 MG tablet Take 1 tablet (20 mg total) by mouth daily. 90 tablet 1  . LORazepam (ATIVAN) 0.5 MG tablet Take 1 tablet (0.5 mg total) by mouth every 6 (six) hours as needed for anxiety. 60 tablet 1  . Multiple Vitamin (MULTIVITAMIN WITH MINERALS) TABS Take 1 tablet by mouth every morning.     No current facility-administered medications on file prior to visit.     There were no vitals taken for this visit.      Objective:   Physical Exam Vitals signs and nursing note reviewed.  Constitutional:      General: He is not in acute distress.    Appearance: Normal appearance. He is not diaphoretic.  HENT:     Head: Normocephalic and atraumatic.     Right Ear: Tympanic membrane, ear canal and external ear normal. There is no impacted cerumen.     Left Ear: Tympanic membrane, ear canal and external ear  normal. There is no impacted cerumen.     Nose: Nose normal. No congestion or rhinorrhea.     Mouth/Throat:     Mouth: Mucous membranes are moist.     Pharynx: No oropharyngeal exudate.  Eyes:     General: No scleral icterus.       Right eye: No discharge.        Left eye: No discharge.  Conjunctiva/sclera: Conjunctivae normal.     Pupils: Pupils are equal, round, and reactive to light.  Neck:     Musculoskeletal: Normal range of motion and neck supple.     Thyroid: No thyromegaly.     Vascular: No carotid bruit or JVD.     Trachea: No tracheal deviation.  Cardiovascular:     Rate and Rhythm: Normal rate and regular rhythm.     Pulses: Normal pulses.     Heart sounds: Normal heart sounds. No murmur. No friction rub. No gallop.   Pulmonary:     Effort: Pulmonary effort is normal. No respiratory distress.     Breath sounds: Normal breath sounds. No stridor. No wheezing, rhonchi or rales.  Chest:     Chest wall: No tenderness.  Abdominal:     General: Bowel sounds are normal. There is no distension.     Palpations: Abdomen is soft. There is no mass.     Tenderness: There is no abdominal tenderness. There is no guarding or rebound.  Musculoskeletal: Normal range of motion.        General: No swelling, tenderness, deformity or signs of injury.     Right lower leg: No edema.     Left lower leg: No edema.  Lymphadenopathy:     Cervical: No cervical adenopathy.  Skin:    General: Skin is warm and dry.     Capillary Refill: Capillary refill takes less than 2 seconds.     Coloration: Skin is not jaundiced or pale.     Findings: No bruising, erythema, lesion or rash.  Neurological:     General: No focal deficit present.     Mental Status: He is alert and oriented to person, place, and time. Mental status is at baseline.     Cranial Nerves: No cranial nerve deficit.     Sensory: No sensory deficit.     Motor: No weakness or abnormal muscle tone.     Coordination: Coordination  normal.     Gait: Gait normal.     Deep Tendon Reflexes: Reflexes are normal and symmetric. Reflexes normal.  Psychiatric:        Mood and Affect: Mood normal.        Behavior: Behavior normal.        Thought Content: Thought content normal.        Judgment: Judgment normal.        Assessment & Plan:  1. Anxiety and depression - Now controlled without medications   2. Mixed hyperlipidemia - likely need to start statin  - CBC with Differential/Platelet - Lipid panel - TSH - CMP  3. Benign prostatic hyperplasia without lower urinary tract symptoms  - PSA  4. Other fatigue - Doubt sleep apnea. Possible Vit D or B12 deficiency. Cannot r/o anemia  - CBC with Differential/Platelet - TSH - Vitamin D, 25-hydroxy - Vitamin B12 - CMP   [name], NP

## 2018-10-20 ENCOUNTER — Other Ambulatory Visit: Payer: Self-pay | Admitting: Family Medicine

## 2018-12-13 ENCOUNTER — Telehealth (INDEPENDENT_AMBULATORY_CARE_PROVIDER_SITE_OTHER): Payer: Medicare Other | Admitting: Adult Health

## 2018-12-13 ENCOUNTER — Other Ambulatory Visit: Payer: Self-pay

## 2018-12-13 DIAGNOSIS — S161XXA Strain of muscle, fascia and tendon at neck level, initial encounter: Secondary | ICD-10-CM

## 2018-12-13 MED ORDER — CYCLOBENZAPRINE HCL 10 MG PO TABS: 10.0000 mg | ORAL_TABLET | Freq: Every day | ORAL | 0 refills | Status: AC

## 2018-12-13 NOTE — Progress Notes (Signed)
Virtual Visit via Video Note  I connected with Dean Thompson  on 12/13/18 at  2:30 PM EDT by a video enabled telemedicine application and verified that I am speaking with the correct person using two identifiers.  Location patient: home Location provider:work or home office Persons participating in the virtual visit: patient, provider  I discussed the limitations of evaluation and management by telemedicine and the availability of in person appointments. The patient expressed understanding and agreed to proceed.   HPI: This 68 year old male who is being evaluated today for an acute issue.  He reports pain is described as pressure and pins sticking" to the base of his neck on his left side.  Symptoms started approximately a week ago.  He denies radiating pain, headaches, blurred vision, redness, warmth, or ear pain/drainage .  Pain is rated 2 -3/10.  Pain is worse when he looks to the right side.  Has no pain with vertical head movement or with chewing food.  He denies aggravating injury or trauma.  He has tried Motrin without relief.  He has not noticed any improvement with warm showers but he has not been paying attention to this either.   ROS: See pertinent positives and negatives per HPI.  Past Medical History:  Diagnosis Date  . Allergy   . Hyperlipidemia   . Rotator cuff tear    left    Past Surgical History:  Procedure Laterality Date  . COLONOSCOPY  2004   in [place] and was normal per pt  . CYSTECTOMY  2006   subacious cyst  remove from neck  . ROTATOR CUFF REPAIR Right 1999  . SEPTOPLASTY  1982  . SHOULDER OPEN ROTATOR CUFF REPAIR  10/01/2011   Procedure: ROTATOR CUFF REPAIR SHOULDER OPEN;  Surgeon: Magnus Sinning, MD;  Location: [facility];  Service: Orthopedics;  Laterality: Left;  Left Shoulder Acrominectomy/Open Rotator Cuff Repair/Distal Clavicle Resection  . SPINE SURGERY  2018    Family History  Adopted: Yes  Problem Relation Age of Onset  . Heart disease Father    . Colon cancer Neg Hx   . Colon polyps Neg Hx   . Rectal cancer Neg Hx   . Stomach cancer Neg Hx      Current Outpatient Medications:  Marland Kitchen  Multiple Vitamin (MULTIVITAMIN WITH MINERALS) TABS, Take 1 tablet by mouth every morning., Disp: , Rfl:   EXAM:  VITALS per patient if applicable:  GENERAL: alert, oriented, appears well and in no acute distress  HEENT: atraumatic, conjunttiva clear, no obvious abnormalities on inspection of external nose and ears  NECK: normal movements of the head and neck  LUNGS: on inspection no signs of respiratory distress, breathing rate appears normal, no obvious gross SOB, gasping or wheezing  CV: no obvious cyanosis  MS: moves all visible extremities without noticeable abnormality  PSYCH/NEURO: pleasant and cooperative, no obvious depression or anxiety, speech and thought processing grossly intact  ASSESSMENT AND PLAN:  Discussed the following assessment and plan:  1. Acute strain of neck muscle, initial encounter -Possible strain of sternomastoid or trapezius muscle.  Does not appear as ear infection or parotid gland infection.  Will prescribe short course of Flexeril that he can take nightly.  Also advised warm compress when resting.    I discussed the assessment and treatment plan with the patient. The patient was provided an opportunity to ask questions and all were answered. The patient agreed with the plan and demonstrated an understanding of the instructions.   The  patient was advised to call back or seek an in-person evaluation if the symptoms worsen or if the condition fails to improve as anticipated.   [name], NP

## 2018-12-19 ENCOUNTER — Telehealth: Payer: Self-pay

## 2018-12-19 NOTE — Telephone Encounter (Signed)
Copied from [place] [id number]. Topic: General - Other >> Dec 19, 2018  3:28 PM Rainey Pines A wrote: Patient called to inform that the muscle relaxers prescribed are not working and that he is getting worse. Patient wants to know if he can referred to specialist. Please advise.

## 2018-12-20 ENCOUNTER — Other Ambulatory Visit: Payer: Self-pay | Admitting: Family Medicine

## 2018-12-20 MED ORDER — METHYLPREDNISOLONE 4 MG PO TBPK: ORAL_TABLET | ORAL | 0 refills | Status: DC

## 2018-12-20 NOTE — Telephone Encounter (Signed)
Patient returning call to [facility].

## 2018-12-20 NOTE — Telephone Encounter (Signed)
Since his pain is not severe, I would like to try a medrol dose pack on him before we send him to a specialist

## 2018-12-20 NOTE — Telephone Encounter (Signed)
Spoke to the pt.  He agreed to try medrol dose pak before referral.  Sent to the pharmacy by e-scribe.  Pt to call back if not getting better.

## 2018-12-20 NOTE — Telephone Encounter (Signed)
Spoke to the pt.  He agreed to try medrol dose pack before referral.  He will call back if further assistance is needed after completing dose pack.  Nothing further needed.

## 2018-12-20 NOTE — Telephone Encounter (Signed)
Left a message for a return call.

## 2019-03-21 DIAGNOSIS — Z20828 Contact with and (suspected) exposure to other viral communicable diseases: Secondary | ICD-10-CM | POA: Diagnosis not present

## 2019-03-21 DIAGNOSIS — Z03818 Encounter for observation for suspected exposure to other biological agents ruled out: Secondary | ICD-10-CM | POA: Diagnosis not present

## 2019-10-16 ENCOUNTER — Other Ambulatory Visit: Payer: Self-pay

## 2019-10-17 ENCOUNTER — Encounter: Payer: Self-pay | Admitting: Adult Health

## 2019-10-17 ENCOUNTER — Ambulatory Visit (INDEPENDENT_AMBULATORY_CARE_PROVIDER_SITE_OTHER): Payer: Medicare Other | Admitting: Adult Health

## 2019-10-17 VITALS — BP 102/80 | HR 58 | Temp 98.0°F | Ht 69.0 in | Wt 196.4 lb

## 2019-10-17 DIAGNOSIS — L570 Actinic keratosis: Secondary | ICD-10-CM | POA: Diagnosis not present

## 2019-10-17 DIAGNOSIS — E782 Mixed hyperlipidemia: Secondary | ICD-10-CM | POA: Diagnosis not present

## 2019-10-17 DIAGNOSIS — N400 Enlarged prostate without lower urinary tract symptoms: Secondary | ICD-10-CM | POA: Diagnosis not present

## 2019-10-17 MED ORDER — EPINEPHRINE 0.3 MG/0.3ML IJ SOAJ: 0.3000 mg | INTRAMUSCULAR | 1 refills | Status: AC | PRN

## 2019-10-17 NOTE — Addendum Note (Signed)
Addended by: Marrion Coy on: 10/17/2019 07:32 AM   Modules accepted: Orders

## 2019-10-17 NOTE — Progress Notes (Signed)
Subjective:    Patient ID: Dean Thompson, male    DOB: Nov 10, 1950, 69 y.o.   MRN: [id number]  HPI Patient presents for yearly preventative medicine examination. He is a pleasant 69 year old male who  has a past medical history of Allergy, Hyperlipidemia, and Rotator cuff tear.  Hyperlipidemia - Not currently on any medications. 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   All immunizations and health maintenance protocols were reviewed with the patient and needed orders were placed.  Appropriate screening laboratory values were ordered for the patient including screening of hyperlipidemia, renal function and hepatic function. If indicated by BPH, a PSA was ordered.  Medication reconciliation,  past medical history, social history, problem list and allergies were reviewed in detail with the patient  Goals were established with regard to weight loss, exercise, and  diet in compliance with medications. He is working out a few times a week and tries to eat healthy.   Wt Readings from Last 3 Encounters:  10/17/19 196 lb 6.4 oz (89.1 kg)  10/13/18 199 lb (90.3 kg)  04/12/18 196 lb (88.9 kg)    He is up to date on routine colon cancer screen.    Review of Systems  Constitutional: Negative.   HENT: Negative.   Eyes: Negative.   Respiratory: Negative.   Cardiovascular: Negative.   Gastrointestinal: Negative.   Endocrine: Negative.   Genitourinary: Negative.   Musculoskeletal: Negative.   Skin: Negative.   Allergic/Immunologic: Negative.   Neurological: Negative.   Hematological: Negative.   Psychiatric/Behavioral: Negative.   All other systems reviewed and are  negative.  Past Medical History:  Diagnosis Date  . Allergy   . Hyperlipidemia   . Rotator cuff tear    left    Social History   Socioeconomic History  . Marital status: Divorced    Spouse name: Not on file  . Number of children: Not on file  . Years of education: Not on file  . Highest education level: Not on file  Occupational History  . Not on file  Tobacco Use  . Smoking status: Never Smoker  . Smokeless tobacco: Never Used  Vaping Use  . Vaping Use: Never used  Substance and Sexual Activity  . Alcohol use: Yes    Comment: occasional  . Drug use: Never  . Sexual activity: Not on file  Other Topics Concern  . 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       Social Determinants of Health   Financial Resource Strain:   . Difficulty of Paying Living Expenses: Not on file  Food Insecurity:   . Worried About Charity fundraiser in the Last Year: Not on file  . Ran Out of Food in the Last Year: Not on file  Transportation Needs:   . Lack of Transportation (Medical): Not on file  . Lack of Transportation (Non-Medical): Not on file  Physical Activity:   . Days of Exercise per Week: Not on file  . Minutes of Exercise per Session: Not on file  Stress:   . Feeling of Stress : Not on file  Social Connections:   . Frequency of Communication with Friends and Family: Not on file  . Frequency of Social Gatherings with Friends and Family: Not on file  . Attends Religious Services: Not on file  . Active Member of Clubs or Organizations: Not on file  . Attends Archivist Meetings: Not on file  . Marital Status: Not on file  Intimate Partner Violence:   . Fear of Current or Ex-Partner: Not on file  . Emotionally Abused: Not on file  . Physically Abused: Not on file  . Sexually Abused: Not on file    Past Surgical History:  Procedure  Laterality Date  . COLONOSCOPY  2004   in [place] and was normal per pt  . CYSTECTOMY  2006   subacious cyst  remove from neck  . ROTATOR CUFF REPAIR Right 1999  . SEPTOPLASTY  1982  . SHOULDER OPEN ROTATOR CUFF REPAIR  10/01/2011   Procedure: ROTATOR CUFF REPAIR SHOULDER OPEN;  Surgeon: Magnus Sinning, MD;  Location: [facility];  Service: Orthopedics;  Laterality: Left;  Left Shoulder Acrominectomy/Open Rotator Cuff Repair/Distal Clavicle Resection  . SPINE SURGERY  2018    Family History  Adopted: Yes  Problem Relation Age of Onset  . Heart disease Father   . Colon cancer Neg Hx   . Colon polyps Neg Hx   . Rectal cancer Neg Hx   . Stomach cancer Neg Hx     Allergies  Allergen Reactions  . Wasp Venom Anaphylaxis  . Citrus Other (See Comments)    "nasal congestion"    Current Outpatient Medications on File Prior to Visit  Medication Sig Dispense Refill  . methylPREDNISolone (MEDROL DOSEPAK) 4 MG TBPK tablet TAKE AS DIRECTED 21 tablet 0  . Multiple Vitamin (MULTIVITAMIN WITH MINERALS) TABS Take 1 tablet by mouth every morning.     No current facility-administered medications on file prior to visit.    There were no vitals taken for this visit.       Objective:   Physical Exam Vitals and nursing note reviewed.  Constitutional:      General: He is not in acute distress.    Appearance: Normal appearance. He is well-developed and normal weight.  HENT:     Head: Normocephalic and atraumatic.     Right Ear: Tympanic membrane, ear canal and external ear normal. There is no impacted cerumen.     Left Ear: Tympanic membrane, ear canal and external ear normal. There is no impacted cerumen.     Nose: Nose normal. No congestion or rhinorrhea.     Mouth/Throat:     Mouth: Mucous membranes are moist.     Pharynx: Oropharynx is clear. No oropharyngeal exudate or posterior oropharyngeal erythema.  Eyes:     General:        Right eye: No discharge.        Left eye: No  discharge.     Extraocular Movements: Extraocular movements intact.     Conjunctiva/sclera: Conjunctivae normal.     Pupils: Pupils are equal, round, and reactive to light.  Neck:     Vascular: No carotid bruit.     Trachea: No tracheal deviation.  Cardiovascular:     Rate and Rhythm: Normal rate and regular rhythm.  Pulses: Normal pulses.     Heart sounds: Normal heart sounds. No murmur heard.  No friction rub. No gallop.   Pulmonary:     Effort: Pulmonary effort is normal. No respiratory distress.     Breath sounds: Normal breath sounds. No stridor. No wheezing, rhonchi or rales.  Chest:     Chest wall: No tenderness.  Abdominal:     General: Bowel sounds are normal. There is no distension.     Palpations: Abdomen is soft. There is no mass.     Tenderness: There is no abdominal tenderness. There is no right CVA tenderness, left CVA tenderness, guarding or rebound.     Hernia: No hernia is present.  Musculoskeletal:        General: No swelling, tenderness, deformity or signs of injury. Normal range of motion.     Right lower leg: No edema.     Left lower leg: No edema.  Lymphadenopathy:     Cervical: No cervical adenopathy.  Skin:    General: Skin is warm and dry.     Capillary Refill: Capillary refill takes less than 2 seconds.     Coloration: Skin is not jaundiced or pale.     Findings: No bruising, erythema, lesion or rash.     Comments: Two small AK's noted on upper back   Neurological:     General: No focal deficit present.     Mental Status: He is alert and oriented to person, place, and time.     Cranial Nerves: No cranial nerve deficit.     Sensory: No sensory deficit.     Motor: No weakness.     Coordination: Coordination normal.     Gait: Gait normal.     Deep Tendon Reflexes: Reflexes normal.  Psychiatric:        Mood and Affect: Mood normal.        Behavior: Behavior normal.        Thought Content: Thought content normal.        Judgment: Judgment  normal.       Assessment & Plan:  1. Mixed hyperlipidemia - Consider statin  - CBC with Differential/Platelet; Future - Lipid panel; Future - TSH; Future - CMP with eGFR([facility]); Future  2. Benign prostatic hyperplasia without lower urinary tract symptoms  - PSA; Future  3. AK (actinic keratosis) - Informed consent received. Using cryotherapy two small AK's were frozen using three freeze-thaw cycles. Patient tolerated procedure well.   [employer]

## 2019-10-18 LAB — CBC WITH DIFFERENTIAL/PLATELET
Absolute Monocytes: 410 cells/uL (ref 200–950)
Basophils Absolute: 22 cells/uL (ref 0–200)
Basophils Relative: 0.4 %
Eosinophils Absolute: 178 cells/uL (ref 15–500)
Eosinophils Relative: 3.3 %
HCT: 47.5 % (ref 38.5–50.0)
Hemoglobin: 16.3 g/dL (ref 13.2–17.1)
Lymphs Abs: 1145 cells/uL (ref 850–3900)
MCH: 31.8 pg (ref 27.0–33.0)
MCHC: 34.3 g/dL (ref 32.0–36.0)
MCV: 92.6 fL (ref 80.0–100.0)
MPV: 11.9 fL (ref 7.5–12.5)
Monocytes Relative: 7.6 %
Neutro Abs: 3645 cells/uL (ref 1500–7800)
Neutrophils Relative %: 67.5 %
Platelets: 136 10*3/uL — ABNORMAL LOW (ref 140–400)
RBC: 5.13 10*6/uL (ref 4.20–5.80)
RDW: 12.4 % (ref 11.0–15.0)
Total Lymphocyte: 21.2 %
WBC: 5.4 10*3/uL (ref 3.8–10.8)

## 2019-10-18 LAB — COMPLETE METABOLIC PANEL WITH GFR
AG Ratio: 2.1 (calc) (ref 1.0–2.5)
ALT: 20 U/L (ref 9–46)
AST: 17 U/L (ref 10–35)
Albumin: 4.4 g/dL (ref 3.6–5.1)
Alkaline phosphatase (APISO): 96 U/L (ref 35–144)
BUN: 19 mg/dL (ref 7–25)
CO2: 26 mmol/L (ref 20–32)
Calcium: 9.5 mg/dL (ref 8.6–10.3)
Chloride: 109 mmol/L (ref 98–110)
Creat: 1.25 mg/dL (ref 0.70–1.25)
GFR, Est African American: 68 mL/min/{1.73_m2} (ref >=60)
GFR, Est Non African American: 58 mL/min/{1.73_m2} — ABNORMAL LOW (ref >=60)
Globulin: 2.1 g/dL (calc) (ref 1.9–3.7)
Glucose, Bld: 97 mg/dL (ref 65–99)
Potassium: 4.6 mmol/L (ref 3.5–5.3)
Sodium: 142 mmol/L (ref 135–146)
Total Bilirubin: 1.3 mg/dL — ABNORMAL HIGH (ref 0.2–1.2)
Total Protein: 6.5 g/dL (ref 6.1–8.1)

## 2019-10-18 LAB — LIPID PANEL
Cholesterol: 171 mg/dL (ref <200)
HDL: 35 mg/dL — ABNORMAL LOW (ref >=40)
LDL Cholesterol (Calc): 111 mg/dL (calc) — ABNORMAL HIGH
Non-HDL Cholesterol (Calc): 136 mg/dL (calc) — ABNORMAL HIGH (ref <130)
Total CHOL/HDL Ratio: 4.9 (calc) (ref <5.0)
Triglycerides: 132 mg/dL (ref <150)

## 2019-10-18 LAB — PSA: PSA: 1.6 ng/mL (ref <=4.0)

## 2019-10-18 LAB — TSH: TSH: 1.86 mIU/L (ref 0.40–4.50)

## 2019-12-23 DIAGNOSIS — L239 Allergic contact dermatitis, unspecified cause: Secondary | ICD-10-CM | POA: Diagnosis not present

## 2019-12-28 ENCOUNTER — Ambulatory Visit: Payer: Medicare Other | Admitting: Adult Health

## 2019-12-28 ENCOUNTER — Ambulatory Visit (INDEPENDENT_AMBULATORY_CARE_PROVIDER_SITE_OTHER): Payer: Medicare Other

## 2019-12-28 ENCOUNTER — Other Ambulatory Visit: Payer: Self-pay

## 2019-12-28 VITALS — BP 118/70 | HR 62 | Temp 97.9°F | Wt 201.2 lb

## 2019-12-28 DIAGNOSIS — Z0000 Encounter for general adult medical examination without abnormal findings: Secondary | ICD-10-CM | POA: Diagnosis not present

## 2019-12-28 NOTE — Progress Notes (Signed)
Subjective:   Dean Thompson is a 69 y.o. male who presents for Medicare Annual/Subsequent preventive examination.   Review of Systems    N/A  Cardiac Risk Factors include: advanced age (>95men, >53 women);male gender     Objective:    Today's Vitals   12/28/19 0827  BP: 118/70  Pulse: 62  Temp: 97.9 F (36.6 C)  TempSrc: Oral  SpO2: 95%  Weight: 201 lb 3 oz (91.3 kg)   Body mass index is 29.71 kg/m.  Advanced Directives 12/28/2019 08/30/2017 11/24/2016 04/29/2016 10/28/2015 10/01/2011 09/28/2011  Does Patient Have a Medical Advance Directive? No No No No No Patient does not have advance directive;Patient would not like information Patient would not like information;Patient does not have advance directive  Would patient like information on creating a medical advance directive? No - Patient declined Yes (MAU/Ambulatory/Procedural Areas - Information given) - No - Patient declined - - -    Current Medications (verified) Outpatient Encounter Medications as of 12/28/2019  Medication Sig  . Multiple Vitamin (MULTIVITAMIN WITH MINERALS) TABS Take 1 tablet by mouth every morning.  Marland Kitchen EPINEPHrine 0.3 mg/0.3 mL IJ SOAJ injection Inject 0.3 mLs (0.3 mg total) into the muscle as needed for anaphylaxis. (Patient not taking: Reported on 12/28/2019)   No facility-administered encounter medications on file as of 12/28/2019.    Allergies (verified) Wasp venom and Citrus   History: Past Medical History:  Diagnosis Date  . Allergy   . Hyperlipidemia   . Rotator cuff tear    left   Past Surgical History:  Procedure Laterality Date  . COLONOSCOPY  2004   in [place] and was normal per pt  . CYSTECTOMY  2006   subacious cyst  remove from neck  . ROTATOR CUFF REPAIR Right 1999  . SEPTOPLASTY  1982  . SHOULDER OPEN ROTATOR CUFF REPAIR  10/01/2011   Procedure: ROTATOR CUFF REPAIR SHOULDER OPEN;  Surgeon: Magnus Sinning, MD;  Location: [facility];  Service: Orthopedics;  Laterality: Left;   Left Shoulder Acrominectomy/Open Rotator Cuff Repair/Distal Clavicle Resection  . SPINE SURGERY  2018   Family History  Adopted: Yes  Problem Relation Age of Onset  . Heart disease Father   . Colon cancer Neg Hx   . Colon polyps Neg Hx   . Rectal cancer Neg Hx   . Stomach cancer Neg Hx    Social History   Socioeconomic History  . Marital status: Divorced    Spouse name: Not on file  . Number of children: Not on file  . Years of education: Not on file  . Highest education level: Not on file  Occupational History  . Not on file  Tobacco Use  . Smoking status: Never Smoker  . Smokeless tobacco: Never Used  Vaping Use  . Vaping Use: Never used  Substance and Sexual Activity  . Alcohol use: Yes    Comment: occasional  . Drug use: Never  . Sexual activity: Not on file  Other Topics Concern  . 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       Social Determinants of Health   Financial Resource Strain: Low Risk   . Difficulty of Paying Living Expenses: Not hard at all  Food Insecurity: No Food Insecurity  . Worried About Estate manager/land agent  of Food in the Last Year: Never true  . Ran Out of Food in the Last Year: Never true  Transportation Needs: No Transportation Needs  . Lack of Transportation (Medical): No  . Lack of Transportation (Non-Medical): No  Physical Activity: Sufficiently Active  . Days of Exercise per Week: 3 days  . Minutes of Exercise per Session: 60 min  Stress: No Stress Concern Present  . Feeling of Stress : Not at all  Social Connections: Moderately Integrated  . Frequency of Communication with Friends and Family: More than three times a week  . Frequency of Social Gatherings with Friends and Family: More than three times a week  . Attends Religious Services: More than 4 times per year  . Active Member of Clubs or Organizations: No    . Attends Archivist Meetings: Never  . Marital Status: Married    Tobacco Counseling Counseling given: Not Answered   Clinical Intake:  Pre-visit preparation completed: Yes  Pain : No/denies pain     Nutritional Risks: None Diabetes: No  How often do you need to have someone help you when you read instructions, pamphlets, or other written materials from your doctor or pharmacy?: 1 - Never What is the last grade level you completed in school?: MBA  Diabetic?No  Interpreter Needed?: No  Information entered by :: [employer] of Daily Living In your present state of health, do you have any difficulty performing the following activities: 12/28/2019  Hearing? N  Vision? N  Difficulty concentrating or making decisions? N  Walking or climbing stairs? N  Dressing or bathing? N  Doing errands, shopping? N  Preparing Food and eating ? N  Using the Toilet? N  In the past six months, have you accidently leaked urine? N  Do you have problems with loss of bowel control? N  Managing your Medications? N  Managing your Finances? N  Housekeeping or managing your Housekeeping? N  Some recent data might be hidden    Patient Care Team: [name], NP as PCP - General (Family Medicine)  Indicate any recent Medical Services you may have received from other than [facility] providers in the past year (date may be approximate).     Assessment:   This is a routine wellness examination for Dean Thompson.  Hearing/Vision screen  Hearing Screening   125Hz  250Hz  500Hz  1000Hz  2000Hz  3000Hz  4000Hz  6000Hz  8000Hz   Right ear:           Left ear:           Vision Screening Comments: Patient states gets eyes checked every 2 years  Dietary issues and exercise activities discussed: Current Exercise Habits: Home exercise routine, Type of exercise: walking;treadmill;strength training/weights, Time (Minutes): 60, Frequency (Times/Week): 3, Weekly Exercise (Minutes/Week): 180,  Intensity: Mild  Goals    . Patient Stated     I will continue to exercise 2-3 days per week    . 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 0 0 (No Data) No  Comment - - Emmi Telephone Survey: data to providers prior to load -  Number falls in past yr: 0 0 (No Data) -  Comment - -  Emmi Telephone Survey Actual Response =  -  Injury with Fall? 0 0 - -  Risk for fall due to : No Fall Risks - - -  Follow up Falls evaluation completed;Falls prevention discussed - - -    Any stairs in or around the home? No  If so, are there any without handrails? No  Home free of loose throw rugs in walkways, pet beds, electrical cords, etc? Yes  Adequate lighting in your home to reduce risk of falls? Yes   ASSISTIVE DEVICES UTILIZED TO PREVENT FALLS:  Life alert? No  Use of a cane, walker or w/c? No  Grab bars in the bathroom? No  Shower chair or bench in shower? No  Elevated toilet seat or a handicapped toilet? No   TIMED UP AND GO:   Was the test performed? Yes .  Length of time to ambulate 10 feet: 5 sec.   Gait steady and fast without use of assistive device  Cognitive Function:  Cognitive screening not indicated based on direct observation      Immunizations Immunization History  Administered Date(s) Administered  . Moderna SARS-COVID-2 Vaccination 06/17/2019, 07/16/2019  . Pneumococcal Conjugate-13 07/29/2015  . Pneumococcal Polysaccharide-23 09/01/2017    TDAP status: Due, Education has been provided regarding the importance of this vaccine. Advised may receive this vaccine at local pharmacy or Health Dept. Aware to provide a copy of the vaccination record if obtained from local pharmacy or Health Dept. Verbalized acceptance and  understanding. Flu Vaccine status: Declined, Education has been provided regarding the importance of this vaccine but patient still declined. Advised may receive this vaccine at local pharmacy or Health Dept. Aware to provide a copy of the vaccination record if obtained from local pharmacy or Health Dept. Verbalized acceptance and understanding. Pneumococcal vaccine status: Up to date Covid-19 vaccine status: Completed vaccines  Qualifies for Shingles Vaccine? Yes   Zostavax completed No   Shingrix Completed?: No.    Education has been provided regarding the importance of this vaccine. Patient has been advised to call insurance company to determine out of pocket expense if they have not yet received this vaccine. Advised may also receive vaccine at local pharmacy or Health Dept. Verbalized acceptance and understanding.  Screening Tests Health Maintenance  Topic Date Due  . TETANUS/TDAP  Never done  . INFLUENZA VACCINE  01/16/2020 (Originally 09/17/2019)  . COLONOSCOPY  11/10/2020  . COVID-19 Vaccine  Completed  . Hepatitis C Screening  Completed  . PNA vac Low Risk Adult  Completed    Health Maintenance  Health Maintenance Due  Topic Date Due  . TETANUS/TDAP  Never done    Colorectal cancer screening: Completed 11/11/2015. Repeat every 5 years  Lung Cancer Screening: (Low Dose CT Chest recommended if Age 72-80 years, 30 pack-year currently smoking OR have quit w/in 15years.) does not qualify.   Lung Cancer Screening Referral: N/A   Additional Screening:  Hepatitis C Screening: does qualify; Completed 09/01/2017  Vision Screening: Recommended annual ophthalmology exams for early detection of glaucoma and other disorders of the eye. Is the patient up to date with their annual eye exam?  Yes Who is the provider or what is the name of the office in which the patient attends annual eye exams? [facility] If pt is not established with a provider, would they like to be  referred to a provider to establish care? Yes .   Dental Screening: Recommended annual dental exams for proper oral hygiene  Community Resource Referral / Chronic Care Management: CRR required this visit?  No   CCM required this visit?  No      Plan:     I have personally reviewed and noted the following in the patient's chart:   . Medical and social history . Use of alcohol, tobacco or illicit drugs  . Current medications and supplements . Functional ability and status . Nutritional status . Physical activity . Advanced directives . List of other physicians . Hospitalizations, surgeries, and ER visits in previous 12 months . Vitals . Screenings to include cognitive, depression, and falls . Referrals and appointments  In addition, I have reviewed and discussed with patient certain preventive protocols, quality metrics, and best practice recommendations. A written personalized care plan for preventive services as well as general preventive health recommendations were provided to patient.     Ofilia Neas, LPN   [id number]   Nurse Notes: None

## 2019-12-28 NOTE — Patient Instructions (Signed)
Dean Thompson , Thank you for taking time to come for your Medicare Wellness Visit. I appreciate your ongoing commitment to your health goals. Please review the following plan we discussed and let me know if I can assist you in the future.   Screening recommendations/referrals: Colonoscopy: Up to date, next due 11/10/2020 Recommended yearly ophthalmology/optometry visit for glaucoma screening and checkup Recommended yearly dental visit for hygiene and checkup  Vaccinations: Influenza vaccine: Patient declined Pneumococcal vaccine: Completed series Tdap vaccine: Currently due for TDAP,  Unfortunately we do not have your last tetanus on file. If you have the date of your last tdap we will gladly update your chart.  Shingles vaccine: Currently due for shingrix, if you wish to receive we recommend that you receive this at your pharmacy.    Advanced directives: Copies of the advanced directive paper work given to you this visit. Once you complete it please bring it in so that we may scan a copy into your chart.  Conditions/risks identified: None   Next appointment: None   Preventive Care 65 Years and Older, Male Preventive care refers to lifestyle choices and visits with your health care provider that can promote health and wellness. What does preventive care include?  A yearly physical exam. This is also called an annual well check.  Dental exams once or twice a year.  Routine eye exams. Ask your health care provider how often you should have your eyes checked.  Personal lifestyle choices, including:  Daily care of your teeth and gums.  Regular physical activity.  Eating a healthy diet.  Avoiding tobacco and drug use.  Limiting alcohol use.  Practicing safe sex.  Taking low doses of aspirin every day.  Taking vitamin and mineral supplements as recommended by your health care provider. What happens during an annual well check? The services and screenings done by your health  care provider during your annual well check will depend on your age, overall health, lifestyle risk factors, and family history of disease. Counseling  Your health care provider may ask you questions about your:  Alcohol use.  Tobacco use.  Drug use.  Emotional well-being.  Home and relationship well-being.  Sexual activity.  Eating habits.  History of falls.  Memory and ability to understand (cognition).  Work and work Statistician. Screening  You may have the following tests or measurements:  Height, weight, and BMI.  Blood pressure.  Lipid and cholesterol levels. These may be checked every 5 years, or more frequently if you are over 83 years old.  Skin check.  Lung cancer screening. You may have this screening every year starting at age 39 if you have a 30-pack-year history of smoking and currently smoke or have quit within the past 15 years.  Fecal occult blood test (FOBT) of the stool. You may have this test every year starting at age 51.  Flexible sigmoidoscopy or colonoscopy. You may have a sigmoidoscopy every 5 years or a colonoscopy every 10 years starting at age 63.  Prostate cancer screening. Recommendations will vary depending on your family history and other risks.  Hepatitis C blood test.  Hepatitis B blood test.  Sexually transmitted disease (STD) testing.  Diabetes screening. This is done by checking your blood sugar (glucose) after you have not eaten for a while (fasting). You may have this done every 1-3 years.  Abdominal aortic aneurysm (AAA) screening. You may need this if you are a current or former smoker.  Osteoporosis. You may be screened starting  at age 43 if you are at high risk. Talk with your health care provider about your test results, treatment options, and if necessary, the need for more tests. Vaccines  Your health care provider may recommend certain vaccines, such as:  Influenza vaccine. This is recommended every  year.  Tetanus, diphtheria, and acellular pertussis (Tdap, Td) vaccine. You may need a Td booster every 10 years.  Zoster vaccine. You may need this after age 51.  Pneumococcal 13-valent conjugate (PCV13) vaccine. One dose is recommended after age 42.  Pneumococcal polysaccharide (PPSV23) vaccine. One dose is recommended after age 45. Talk to your health care provider about which screenings and vaccines you need and how often you need them. This information is not intended to replace advice given to you by your health care provider. Make sure you discuss any questions you have with your health care provider. Document Released: 03/01/2015 Document Revised: 10/23/2015 Document Reviewed: 12/04/2014 Elsevier Interactive Patient Education  2017 [place] Prevention in the Home Falls can cause injuries. They can happen to people of all ages. There are many things you can do to make your home safe and to help prevent falls. What can I do on the outside of my home?  Regularly fix the edges of walkways and driveways and fix any cracks.  Remove anything that might make you trip as you walk through a door, such as a raised step or threshold.  Trim any bushes or trees on the path to your home.  Use bright outdoor lighting.  Clear any walking paths of anything that might make someone trip, such as rocks or tools.  Regularly check to see if handrails are loose or broken. Make sure that both sides of any steps have handrails.  Any raised decks and porches should have guardrails on the edges.  Have any leaves, snow, or ice cleared regularly.  Use sand or salt on walking paths during winter.  Clean up any spills in your garage right away. This includes oil or grease spills. What can I do in the bathroom?  Use night lights.  Install grab bars by the toilet and in the tub and shower. Do not use towel bars as grab bars.  Use non-skid mats or decals in the tub or shower.  If you  need to sit down in the shower, use a plastic, non-slip stool.  Keep the floor dry. Clean up any water that spills on the floor as soon as it happens.  Remove soap buildup in the tub or shower regularly.  Attach bath mats securely with double-sided non-slip rug tape.  Do not have throw rugs and other things on the floor that can make you trip. What can I do in the bedroom?  Use night lights.  Make sure that you have a light by your bed that is easy to reach.  Do not use any sheets or blankets that are too big for your bed. They should not hang down onto the floor.  Have a firm chair that has side arms. You can use this for support while you get dressed.  Do not have throw rugs and other things on the floor that can make you trip. What can I do in the kitchen?  Clean up any spills right away.  Avoid walking on wet floors.  Keep items that you use a lot in easy-to-reach places.  If you need to reach something above you, use a strong step stool that has a grab bar.  Keep electrical cords out of the way.  Do not use floor polish or wax that makes floors slippery. If you must use wax, use non-skid floor wax.  Do not have throw rugs and other things on the floor that can make you trip. What can I do with my stairs?  Do not leave any items on the stairs.  Make sure that there are handrails on both sides of the stairs and use them. Fix handrails that are broken or loose. Make sure that handrails are as long as the stairways.  Check any carpeting to make sure that it is firmly attached to the stairs. Fix any carpet that is loose or worn.  Avoid having throw rugs at the top or bottom of the stairs. If you do have throw rugs, attach them to the floor with carpet tape.  Make sure that you have a light switch at the top of the stairs and the bottom of the stairs. If you do not have them, ask someone to add them for you. What else can I do to help prevent falls?  Wear shoes  that:  Do not have high heels.  Have rubber bottoms.  Are comfortable and fit you well.  Are closed at the toe. Do not wear sandals.  If you use a stepladder:  Make sure that it is fully opened. Do not climb a closed stepladder.  Make sure that both sides of the stepladder are locked into place.  Ask someone to hold it for you, if possible.  Clearly mark and make sure that you can see:  Any grab bars or handrails.  First and last steps.  Where the edge of each step is.  Use tools that help you move around (mobility aids) if they are needed. These include:  Canes.  Walkers.  Scooters.  Crutches.  Turn on the lights when you go into a dark area. Replace any light bulbs as soon as they burn out.  Set up your furniture so you have a clear path. Avoid moving your furniture around.  If any of your floors are uneven, fix them.  If there are any pets around you, be aware of where they are.  Review your medicines with your doctor. Some medicines can make you feel dizzy. This can increase your chance of falling. Ask your doctor what other things that you can do to help prevent falls. This information is not intended to replace advice given to you by your health care provider. Make sure you discuss any questions you have with your health care provider. Document Released: 11/29/2008 Document Revised: 07/11/2015 Document Reviewed: 03/09/2014 Elsevier Interactive Patient Education  2017 [employer].

## 2020-01-02 ENCOUNTER — Ambulatory Visit (INDEPENDENT_AMBULATORY_CARE_PROVIDER_SITE_OTHER): Payer: Medicare Other | Admitting: Adult Health

## 2020-01-02 ENCOUNTER — Encounter: Payer: Self-pay | Admitting: Adult Health

## 2020-01-02 ENCOUNTER — Other Ambulatory Visit: Payer: Self-pay

## 2020-01-02 VITALS — BP 110/76 | HR 67 | Ht 69.0 in | Wt 198.8 lb

## 2020-01-02 DIAGNOSIS — R21 Rash and other nonspecific skin eruption: Secondary | ICD-10-CM

## 2020-01-02 MED ORDER — FLUCONAZOLE 150 MG PO TABS: 150.0000 mg | ORAL_TABLET | Freq: Once | ORAL | 0 refills | Status: AC

## 2020-01-02 MED ORDER — HYDROXYZINE HCL 50 MG PO TABS: 50.0000 mg | ORAL_TABLET | Freq: Three times a day (TID) | ORAL | 0 refills | Status: DC | PRN

## 2020-01-02 MED ORDER — PREDNISONE 10 MG PO TABS: ORAL_TABLET | ORAL | 0 refills | Status: DC

## 2020-01-02 NOTE — Progress Notes (Signed)
Subjective:    Patient ID: Dean Thompson, male    DOB: Sep 28, 1950, 69 y.o.   MRN: [id number]  HPI 69 year old male who  has a past medical history of Allergy, Hyperlipidemia, and Rotator cuff tear.  He is being seen today with the complaints of itchy rash throughout his body that he has had for a month.  He was originally seen at urgent care [facility] about a month ago and was given a prescription for a medrol dose pack and topical cortisone cream.  Reports that soon after finishing the Medrol Dosepak his symptoms returned.  Reports red itchy rash throughout his body that is mostly present on his torso with constant itching.  He denies changes in soaps, shampoos, or detergents.  He stayed at his own home while in [facility], no concern for bedbugs or scabies   Review of Systems See HPI   Past Medical History:  Diagnosis Date   Allergy    Hyperlipidemia    Rotator cuff tear    left    Social History   Socioeconomic History   Marital status: Divorced    Spouse name: Not on file   Number of children: Not on file   Years of education: Not on file   Highest education level: Not on file  Occupational History   Not on file  Tobacco Use   Smoking status: Never Smoker   Smokeless tobacco: Never Used  Vaping Use   Vaping Use: Never used  Substance and Sexual Activity   Alcohol use: Yes    Comment: occasional   Drug use: Never   Sexual activity: Not on file  Other Topics Concern   Not on file  Social History Narrative   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       Social Determinants of Health   Financial Resource Strain: Low Risk    Difficulty of Paying Living Expenses: Not hard at all  Food Insecurity: No Food Insecurity   Worried About Charity fundraiser in the Last Year: Never true   [place] in the Last Year: Never true  Transportation Needs:  No Transportation Needs   Lack of Transportation (Medical): No   Lack of Transportation (Non-Medical): No  Physical Activity: Sufficiently Active   Days of Exercise per Week: 3 days   Minutes of Exercise per Session: 60 min  Stress: No Stress Concern Present   Feeling of Stress : Not at all  Social Connections: Moderately Integrated   Frequency of Communication with Friends and Family: More than three times a week   Frequency of Social Gatherings with Friends and Family: More than three times a week   Attends Religious Services: More than 4 times per year   Active Member of [employer] or Organizations: No   Attends Archivist Meetings: Never   Marital Status: Married  Human resources officer Violence: Not At Risk   Fear of Current or Ex-Partner: No   Emotionally Abused: No   Physically Abused: No   Sexually Abused: No    Past Surgical History:  Procedure Laterality Date   COLONOSCOPY  2004   in [place] and was normal per pt   CYSTECTOMY  2006   subacious cyst  remove from neck   [place] REPAIR  10/01/2011   Procedure: ROTATOR CUFF REPAIR SHOULDER OPEN;  Surgeon: Magnus Sinning, MD;  Location: [facility];  Service: Orthopedics;  Laterality: Left;  Left Shoulder Acrominectomy/Open Rotator Cuff Repair/Distal Clavicle Resection   SPINE SURGERY  2018    Family History  Adopted: Yes  Problem Relation Age of Onset   Heart disease Father    Colon cancer Neg Hx    Colon polyps Neg Hx    Rectal cancer Neg Hx    Stomach cancer Neg Hx     Allergies  Allergen Reactions   Wasp Venom Anaphylaxis   Citrus Other (See Comments)    "nasal congestion"    Current Outpatient Medications on File Prior to Visit  Medication Sig Dispense Refill   EPINEPHrine 0.3 mg/0.3 mL IJ SOAJ injection Inject 0.3 mLs (0.3 mg total) into the muscle as needed for anaphylaxis. 2 each 1   Multiple Vitamin  (MULTIVITAMIN WITH MINERALS) TABS Take 1 tablet by mouth every morning.     No current facility-administered medications on file prior to visit.    BP 110/76 (BP Location: Left Arm, Patient Position: Sitting, Cuff Size: Large)    Pulse 67    Ht 5\' 9"  (1.753 m)    Wt 198 lb 12.8 oz (90.2 kg)    BMI 29.36 kg/m       Objective:   Physical Exam Vitals and nursing note reviewed.  Constitutional:      Appearance: Normal appearance.  Cardiovascular:     Rate and Rhythm: Normal rate and regular rhythm.  Skin:    General: Skin is warm and dry.     Findings: Rash present. Rash is macular (Throughout body).  Neurological:     Mental Status: He is alert.  Psychiatric:        Mood and Affect: Mood normal.        Behavior: Behavior normal.        Thought Content: Thought content normal.        Judgment: Judgment normal.        Assessment & Plan:  1. Rash and nonspecific skin eruption -Sounds like he had a rebound allergic reaction.  Will place on prednisone for 14 days also cover for unusual fungal infection with a course of Diflucan.  We will send in Atarax 50 mg 3 times daily to help with his itching.  He was advised that this medication can make him sleepy.  Also advised to get some good lotion such as Eucerin or Goldbond and apply this daily.  Switch detergents to hypoallergenic.  Follow-up if no improvement - predniSONE (DELTASONE) 10 MG tablet; 20 mg ( two tabs)  x 7 days then 10 mg x 7 days  Dispense: 21 tablet; Refill: 0 - fluconazole (DIFLUCAN) 150 MG tablet; Take 1 tablet (150 mg total) by mouth once for 1 dose.  Dispense: 1 tablet; Refill: 0  [name], NP

## 2020-01-17 ENCOUNTER — Telehealth: Payer: Self-pay | Admitting: Adult Health

## 2020-01-17 DIAGNOSIS — R21 Rash and other nonspecific skin eruption: Secondary | ICD-10-CM

## 2020-01-17 NOTE — Telephone Encounter (Signed)
Okay to refer? 

## 2020-01-17 NOTE — Telephone Encounter (Signed)
Pt is calling in stating that he still has the itching for over a month and would like to have a referral to see what is going on with the continuous itching.

## 2020-01-17 NOTE — Telephone Encounter (Signed)
Dean Thompson is returning your call and want a call back

## 2020-01-17 NOTE — Telephone Encounter (Signed)
Not sure what this is about. Is it derm that he is requesting? If so, ok to refer?

## 2020-01-17 NOTE — Telephone Encounter (Signed)
Patient request a referral to an Allergist.  Referral placed.

## 2020-01-17 NOTE — Telephone Encounter (Signed)
Left message on machine for patient to return our call 

## 2020-01-18 ENCOUNTER — Telehealth: Payer: Medicare Other | Admitting: Family Medicine

## 2020-01-18 ENCOUNTER — Encounter: Payer: Self-pay | Admitting: Family Medicine

## 2020-01-18 DIAGNOSIS — L259 Unspecified contact dermatitis, unspecified cause: Secondary | ICD-10-CM | POA: Diagnosis not present

## 2020-01-18 NOTE — Progress Notes (Signed)
On check in [place] so unable to complete visit due to telemedicine restrictions. Advised him of other options for care. He prefers follow up with PCP and plans to call his PCP office. I sent message to PCP office as well.

## 2020-02-13 ENCOUNTER — Other Ambulatory Visit: Payer: Self-pay

## 2020-02-13 ENCOUNTER — Ambulatory Visit (INDEPENDENT_AMBULATORY_CARE_PROVIDER_SITE_OTHER): Payer: Medicare Other | Admitting: Allergy

## 2020-02-13 ENCOUNTER — Encounter: Payer: Self-pay | Admitting: Allergy

## 2020-02-13 VITALS — BP 110/68 | HR 69 | Temp 97.2°F | Resp 16 | Ht 70.0 in | Wt 202.0 lb

## 2020-02-13 DIAGNOSIS — L282 Other prurigo: Secondary | ICD-10-CM | POA: Diagnosis not present

## 2020-02-13 DIAGNOSIS — J310 Chronic rhinitis: Secondary | ICD-10-CM

## 2020-02-13 DIAGNOSIS — R059 Cough, unspecified: Secondary | ICD-10-CM | POA: Diagnosis not present

## 2020-02-13 DIAGNOSIS — Z91038 Other insect allergy status: Secondary | ICD-10-CM

## 2020-02-13 DIAGNOSIS — T781XXD Other adverse food reactions, not elsewhere classified, subsequent encounter: Secondary | ICD-10-CM

## 2020-02-13 MED ORDER — ALBUTEROL SULFATE HFA 108 (90 BASE) MCG/ACT IN AERS: 2.0000 | INHALATION_SPRAY | RESPIRATORY_TRACT | 2 refills | Status: DC | PRN

## 2020-02-13 NOTE — Progress Notes (Signed)
New Patient Note  RE: Dean Thompson MRN: [id number] DOB: 1950-03-27 Date of Office Visit: 02/13/2020  Referring provider: [name], NP Primary care provider: [name], NP  Chief Complaint: Rash (4-6 weeks ago )  History of Present Illness: I had the pleasure of seeing Melville Lionetti for initial evaluation at the [facility] [place] [facility] [place] on 02/13/2020. He is a 69 y.o. male, who is referred here by [name], NP for the evaluation of pruritus.  Rash started about 6 weeks ago. Mainly occurs on his chest, abdominal area. Describes them as red, little bumpy, itchy. Individual rashes lasted weeks. Associated symptoms include: none. Suspected triggers are unknown. Denies any fevers, chills, changes in medications, foods, personal care products or recent infections. He has tried the following therapies: hydroxyzine with good benefit. Diflucan - with unknown benefit. Systemic steroids yes. Currently on no medications.  Previous work up includes: none. Previous history of rash/hives: no. Patient is up to date with the following cancer screening tests: colonoscopy, physical exam.  01/02/2020 PCP visit: "He is being seen today with the complaints of itchy rash throughout his body that he has had for a month.  He was originally seen at urgent care [facility] about a month ago and was given a prescription for a medrol dose pack and topical cortisone cream.  Reports that soon after finishing the Medrol Dosepak his symptoms returned.  Reports red itchy rash throughout his body that is mostly present on his torso with constant itching.  He denies changes in soaps, shampoos, or detergents.  He stayed at his own home while in [facility], no concern for bedbugs or scabies  -Sounds like he had a rebound allergic reaction.  Will place on prednisone for 14 days also cover for unusual fungal infection with a course of Diflucan.  We will send in Atarax 50 mg 3 times daily to help with his  itching.  He was advised that this medication can make him sleepy.  Also advised to get some good lotion such as Eucerin or Goldbond and apply this daily.  Switch detergents to hypoallergenic.  Follow-up if no improvement - predniSONE (DELTASONE) 10 MG tablet; 20 mg ( two tabs)  x 7 days then 10 mg x 7 days  Dispense: 21 tablet; Refill: 0 - fluconazole (DIFLUCAN) 150 MG tablet; Take 1 tablet (150 mg total) by mouth once for 1 dose.  Dispense: 1 tablet; Refill: 0"  Assessment and Plan: Archit is a 69 y.o. male with: Pruritic rash Pruritic rash which started 6 weeks ago.  Unknown trigger.  Took hydroxyzine, Diflucan and prednisone.  Rash has significantly improved.  Denies any changes in diet, medication or personal care products.  Today's skin testing showed: Negative to indoor/outdoor allergens and select foods - results given.   Discussed with patient that I'm not sure what caused the rash but it's improving so no further evaluation needed.   If it flares again, recommend dermatology evaluation next.   See below for proper skin care.   Coughing Episodes of coughing/hacking. Sometimes it's caused by after eating certain foods and today just had an episode in the waiting room. Denies heartburn. No prior asthma diagnosis.  Today's spirometry showed:  possible restrictive disease with 17% improvement in FEV1 post bronchodilator treatment. Clinically feeling the same but states it was easier to exhale post treatment.   Question if there's a component of reactive airway disease/asthma contributing to his coughing.   May use albuterol rescue inhaler  2 puffs or nebulizer every 4 to 6 hours as needed for shortness of breath, chest tightness, coughing, and wheezing. Monitor frequency of use.   Repeat spirometry at next visit.  Chronic rhinitis Some rhinitis symptoms during seasonal changes.  Today's skin testing showed: Negative to indoor/outdoor allergens and select foods - results given.    May use over the counter antihistamines such as Zyrtec (cetirizine), Claritin (loratadine), Allegra (fexofenadine), or Xyzal (levocetirizine) daily as needed.  May use Flonase (fluticasone) nasal spray 1 spray per nostril twice a day as needed for nasal congestion.   May use Afrin for short periods for nasal congestion.   Adverse reaction to food, subsequent encounter Oranges, lemons, strawberries, spicy foods and cinnamon cause coughing at times. Denies heartburn.   Today's skin testing was negative to oranges, strawberries and cinnamon.  Monitor symptoms.   Concerning for heartburn or reactive airway disease for the coughing rather than a true IgE mediated food allergy.  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 Used to be on injections for 6 months only. No recent evaluation or stings.   Continue avoidance.   For mild symptoms you can take over the counter antihistamines such as Benadryl and monitor symptoms closely. If symptoms worsen or if you have severe symptoms including breathing issues, throat closure, significant swelling, whole body hives, severe diarrhea and vomiting, lightheadedness then inject epinephrine and seek immediate medical care afterwards.  Emergency action plan given.  Get bloodwork - hymenoptera panel and tryptase.   Return in about 3 months (around 05/13/2020).  Meds ordered this encounter  Medications  . albuterol (VENTOLIN HFA) 108 (90 Base) MCG/ACT inhaler    Sig: Inhale 2 puffs into the lungs every 4 (four) hours as needed for wheezing or shortness of breath (coughing fits).    Dispense:  8 g    Refill:  2    Lab Orders     Allergen Fire Ant     Allergen Hymenoptera Panel     Tryptase  Other allergy screening: Asthma: no Rhino conjunctivitis: yes  Some rhinitis symptoms during seasonal changes and takes Afrin as needed. Food allergy:   Citrus fruits - oranges, lemons and strawberries cause hacking/coughing. Spicy  foods cause some coughing sometimes just by inhaling the fumes.  Cinnamon candy - caused coughing. Denies any heartburn symptoms.   Medication allergy: no Hymenoptera allergy: yes  2 wasp stings- had whole body pruritus, swelling  Last episode was in the 1990s and treated in the ER. No recent evaluation. Has epipen.  Patient was on allergy injections for about 6 months and not sure why it was stopped.  History of recurrent infections suggestive of immunodeficency: no  Diagnostics: Spirometry:  Tracings reviewed. His effort: Good reproducible efforts. FVC: 2.39L FEV1: 1.86L, 56% predicted FEV1/FVC ratio: 78% Interpretation: Spirometry consistent with possible restrictive disease with 17% improvement in FEV1 post bronchodilator treatment. Clinically feeling the same but states it was easier to exhale post treatment.  Please see scanned spirometry results for details.  Skin Testing: Environmental allergy panel and select foods. Negative to indoor/outdoor allergens and select foods. Results discussed with patient/family.  Airborne Adult Perc - 02/13/20 1428    Time Antigen Placed 1428    Allergen Manufacturer Lavella Hammock    Location Back    Number of Test 59    1. Control-Buffer 50% Glycerol Negative    2. Control-Histamine 1 mg/ml 2+    3. Albumin saline Negative    4. Dry Ridge Negative  5. French Southern Territories Negative    6. Johnson Negative    7. Kentucky Blue Negative    8. Meadow Fescue Negative    9. Perennial Rye Negative    10. Sweet Vernal Negative    11. Timothy Negative    12. Cocklebur Negative    13. Burweed Marshelder Negative    14. Ragweed, short Negative    15. Ragweed, Giant Negative    16. Plantain,  English Negative    17. Lamb's Quarters Negative    18. Sheep Sorrell Negative    19. Rough Pigweed Negative    20. Marsh Elder, Rough Negative    21. Mugwort, Common Negative    22. Ash mix Negative    23. Birch mix Negative    24. Beech American Negative    25. Box,  Elder Negative    26. Cedar, red Negative    27. Cottonwood, Guinea-Bissau Negative    28. Elm mix Negative    29. Hickory Negative    30. Maple mix Negative    31. Oak, Guinea-Bissau mix Negative    32. Pecan Pollen Negative    33. Pine mix Negative    34. Sycamore Eastern Negative    35. Walnut, Black Pollen Negative    36. Alternaria alternata Negative    37. Cladosporium Herbarum Negative    38. Aspergillus mix Negative    39. Penicillium mix Negative    40. Bipolaris sorokiniana (Helminthosporium) Negative    41. Drechslera spicifera (Curvularia) Negative    42. Mucor plumbeus Negative    43. Fusarium moniliforme Negative    44. Aureobasidium pullulans (pullulara) Negative    45. Rhizopus oryzae Negative    46. Botrytis cinera Negative    47. Epicoccum nigrum Negative    48. Phoma betae Negative    49. Candida Albicans Negative    50. Trichophyton mentagrophytes Negative    51. Mite, D Farinae  5,000 AU/ml Negative    52. Mite, D Pteronyssinus  5,000 AU/ml Negative    53. Cat Hair 10,000 BAU/ml Negative    54.  Dog Epithelia Negative    55. Mixed Feathers Negative    56. Horse Epithelia Negative    57. Cockroach, German Negative    58. Mouse Negative    59. Tobacco Leaf Negative          Food Adult Perc - 02/13/20 1400    Time Antigen Placed 1444    Allergen Manufacturer Waynette Buttery    Location Back    Number of allergen test 3    56. Orange  Negative    60. Strawberry Negative    67. Cinnamon Negative           Past Medical History: Patient Active Problem List   Diagnosis Date Noted  . Pruritic rash 02/13/2020  . Hymenoptera allergy 02/13/2020  . Adverse reaction to food, subsequent encounter 02/13/2020  . Chronic rhinitis 02/13/2020  . Coughing 02/13/2020   Past Medical History:  Diagnosis Date  . Allergy   . Hyperlipidemia   . Rotator cuff tear    left   Past Surgical History: Past Surgical History:  Procedure Laterality Date  . COLONOSCOPY  2004   in  [place] and was normal per pt  . CYSTECTOMY  2006   subacious cyst  remove from neck  . ROTATOR CUFF REPAIR Right 1999  . SEPTOPLASTY  1982  . SHOULDER OPEN ROTATOR CUFF REPAIR  10/01/2011   Procedure: ROTATOR CUFF REPAIR SHOULDER OPEN;  Surgeon: Drucilla SchmidtJames P Aplington, MD;  Location: [facility];  Service: Orthopedics;  Laterality: Left;  Left Shoulder Acrominectomy/Open Rotator Cuff Repair/Distal Clavicle Resection  . SPINE SURGERY  2018   Medication List:  Current Outpatient Medications  Medication Sig Dispense Refill  . albuterol (VENTOLIN HFA) 108 (90 Base) MCG/ACT inhaler Inhale 2 puffs into the lungs every 4 (four) hours as needed for wheezing or shortness of breath (coughing fits). 8 g 2  . EPINEPHrine 0.3 mg/0.3 mL IJ SOAJ injection Inject 0.3 mLs (0.3 mg total) into the muscle as needed for anaphylaxis. 2 each 1  . Multiple Vitamin (MULTIVITAMIN WITH MINERALS) TABS Take 1 tablet by mouth every morning.    . triamcinolone (KENALOG) 0.1 % Apply topically.    . hydrOXYzine (VISTARIL) 25 MG capsule Take 25 mg by mouth 3 (three) times daily. (Patient not taking: Reported on 02/13/2020)     No current facility-administered medications for this visit.   Allergies: Allergies  Allergen Reactions  . Wasp Venom Anaphylaxis  . Citrus Other (See Comments)    "nasal congestion"  . Other     Spicy foods and tomato based foods cause a cough, uses Loratadine with relief   Social History: Social History   Socioeconomic History  . Marital status: Divorced    Spouse name: Not on file  . Number of children: Not on file  . Years of education: Not on file  . Highest education level: Not on file  Occupational History  . Not on file  Tobacco Use  . Smoking status: Never Smoker  . Smokeless tobacco: Never Used  Vaping Use  . Vaping Use: Never used  Substance and Sexual Activity  . Alcohol use: Not Currently  . Drug use: Never  . Sexual activity: Not on file  Other Topics Concern  . 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       Social Determinants of Health   Financial Resource Strain: Low Risk   . Difficulty of Paying Living Expenses: Not hard at all  Food Insecurity: No Food Insecurity  . Worried About Programme researcher, broadcasting/film/videounning Out of Food in the Last Year: Never true  . Ran Out of Food in the Last Year: Never true  Transportation Needs: No Transportation Needs  . Lack of Transportation (Medical): No  . Lack of Transportation (Non-Medical): No  Physical Activity: Sufficiently Active  . Days of Exercise per Week: 3 days  . Minutes of Exercise per Session: 60 min  Stress: No Stress Concern Present  . Feeling of Stress : Not at all  Social Connections: Moderately Integrated  . Frequency of Communication with Friends and Family: More than three times a week  . Frequency of Social Gatherings with Friends and Family: More than three times a week  . Attends Religious Services: More than 4 times per year  . Active Member of Clubs or Organizations: No  . Attends BankerClub or Organization Meetings: Never  . Marital Status: Married   Lives in a townhome. Smoking: denies Occupation: Air traffic controllerreal estate firm owner  Environmental HistorySurveyor, minerals: Water Damage/mildew in the house: no Engineer, civil (consulting)Carpet in the family room: no Carpet in the bedroom: no Heating: electric Cooling: heat pump Pet: no  Family History: Family History  Adopted: Yes  Problem Relation Age of Onset  . Heart  disease Father   . Colon cancer Neg Hx   . Colon polyps Neg Hx   . Rectal cancer Neg Hx   . Stomach cancer Neg Hx   . Asthma Neg Hx   . Allergic rhinitis Neg Hx   . Immunodeficiency Neg Hx   . Eczema Neg Hx   . Urticaria Neg Hx   . Angioedema Neg Hx    Review of Systems  Constitutional: Negative for appetite change, chills, fever and unexpected weight change.  HENT: Negative for congestion and rhinorrhea.    Eyes: Negative for itching.  Respiratory: Positive for cough. Negative for chest tightness, shortness of breath and wheezing.   Cardiovascular: Negative for chest pain.  Gastrointestinal: Negative for abdominal pain.  Genitourinary: Negative for difficulty urinating.  Skin: Positive for rash.  Allergic/Immunologic: Negative for environmental allergies.  Neurological: Negative for headaches.   Objective: BP 110/68   Pulse 69   Temp (!) 97.2 F (36.2 C) (Temporal)   Resp 16   Ht 5\' 10"  (1.778 m) Comment: with shoes  Wt 202 lb (91.6 kg)   SpO2 94%   BMI 28.98 kg/m  Body mass index is 28.98 kg/m. Physical Exam Vitals and nursing note reviewed.  Constitutional:      Appearance: Normal appearance. He is well-developed.  HENT:     Head: Normocephalic and atraumatic.     Right Ear: Tympanic membrane and external ear normal.     Left Ear: Tympanic membrane and external ear normal.     Nose: Nose normal.     Mouth/Throat:     Mouth: Mucous membranes are moist.     Pharynx: Oropharynx is clear.  Eyes:     Conjunctiva/sclera: Conjunctivae normal.  Cardiovascular:     Rate and Rhythm: Normal rate and regular rhythm.     Heart sounds: Normal heart sounds. No murmur heard. No friction rub. No gallop.   Pulmonary:     Effort: Pulmonary effort is normal.     Breath sounds: Normal breath sounds. No wheezing, rhonchi or rales.  Musculoskeletal:     Cervical back: Neck supple.  Skin:    General: Skin is warm.     Findings: No rash.  Neurological:     Mental Status: He is alert and oriented to person, place, and time.  Psychiatric:        Behavior: Behavior normal.    The plan was reviewed with the patient/family, and all questions/concerned were addressed.  It was my pleasure to see Nemecio today and participate in his care. Please feel free to contact me with any questions or concerns.  Sincerely,  Rexene Alberts, DO Allergy & Immunology  [facility] office: [place] office: [phone]

## 2020-02-13 NOTE — Patient Instructions (Addendum)
Today's skin testing showed: Negative to indoor/outdoor allergens and select foods - results given.   Rhinitis:   May use over the counter antihistamines such as Zyrtec (cetirizine), Claritin (loratadine), Allegra (fexofenadine), or Xyzal (levocetirizine) daily as needed.  May use Flonase (fluticasone) nasal spray 1 spray per nostril twice a day as needed for nasal congestion.   May use Afrin for short periods for nasal congestion.   Rash:  Not sure what caused the rash.   If it flares again, may need to see dermatology for further evaluation.  See below for proper skin care.   Coughing:  May use albuterol rescue inhaler 2 puffs or nebulizer every 4 to 6 hours as needed for shortness of breath, chest tightness, coughing, and wheezing. Monitor frequency of use.   Venom allergy:  For mild symptoms you can take over the counter antihistamines such as Benadryl and monitor symptoms closely. If symptoms worsen or if you have severe symptoms including breathing issues, throat closure, significant swelling, whole body hives, severe diarrhea and vomiting, lightheadedness then inject epinephrine and seek immediate medical care afterwards.  Emergency action plan given.  Get bloodwork:  We are ordering labs, so please allow 1-2 weeks for the results to come back. With the newly implemented Cures Act, the labs might be visible to you at the same time that they become visible to me. However, I will not address the results until all of the results are back, so please be patient.   Follow up in 3 months or sooner if needed to check your breathing/coughing.  Skin care recommendations  Bath time: . Always use lukewarm water. AVOID very hot or cold water. Marland Kitchen Keep bathing time to 5-10 minutes. . Do NOT use bubble bath. . Use a mild soap and use just enough to wash the dirty areas. . Do NOT scrub skin vigorously.  . After bathing, pat dry your skin with a towel. Do NOT rub or scrub the  skin.  Moisturizers and prescriptions:  . ALWAYS apply moisturizers immediately after bathing (within 3 minutes). This helps to lock-in moisture. . Use the moisturizer several times a day over the whole body. Peri [place] summer moisturizers include: Aveeno, CeraVe, Cetaphil. Peri Jefferson winter moisturizers include: Aquaphor, Vaseline, Cerave, Cetaphil, Eucerin, Vanicream. . When using moisturizers along with medications, the moisturizer should be applied about one hour after applying the medication to prevent diluting effect of the medication or moisturize around where you applied the medications. When not using medications, the moisturizer can be continued twice daily as maintenance.  Laundry and clothing: . Avoid laundry products with added color or perfumes. . Use unscented hypo-allergenic laundry products such as Tide free, Cheer free & gentle, and All free and clear.  . If the skin still seems dry or sensitive, you can try double-rinsing the clothes. . Avoid tight or scratchy clothing such as wool. . Do not use fabric softeners or dyer sheets.

## 2020-02-13 NOTE — Assessment & Plan Note (Signed)
Episodes of coughing/hacking. Sometimes it's caused by after eating certain foods and today just had an episode in the waiting room. Denies heartburn. No prior asthma diagnosis.  Today's spirometry showed:  possible restrictive disease with 17% improvement in FEV1 post bronchodilator treatment. Clinically feeling the same but states it was easier to exhale post treatment.   Question if there's a component of reactive airway disease/asthma contributing to his coughing.   May use albuterol rescue inhaler 2 puffs or nebulizer every 4 to 6 hours as needed for shortness of breath, chest tightness, coughing, and wheezing. Monitor frequency of use.   Repeat spirometry at next visit.

## 2020-02-13 NOTE — Assessment & Plan Note (Signed)
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 Used to be on injections for 6 months only. No recent evaluation or stings.   Continue avoidance.   For mild symptoms you can take over the counter antihistamines such as Benadryl and monitor symptoms closely. If symptoms worsen or if you have severe symptoms including breathing issues, throat closure, significant swelling, whole body hives, severe diarrhea and vomiting, lightheadedness then inject epinephrine and seek immediate medical care afterwards.  Emergency action plan given.  Get bloodwork - hymenoptera panel and tryptase.

## 2020-02-13 NOTE — Assessment & Plan Note (Signed)
Pruritic rash which started 6 weeks ago.  Unknown trigger.  Took hydroxyzine, Diflucan and prednisone.  Rash has significantly improved.  Denies any changes in diet, medication or personal care products.  Today's skin testing showed: Negative to indoor/outdoor allergens and select foods - results given.   Discussed with patient that I'm not sure what caused the rash but it's improving so no further evaluation needed.   If it flares again, recommend dermatology evaluation next.   See below for proper skin care.

## 2020-02-13 NOTE — Assessment & Plan Note (Signed)
Some rhinitis symptoms during seasonal changes.  Today's skin testing showed: Negative to indoor/outdoor allergens and select foods - results given.   May use over the counter antihistamines such as Zyrtec (cetirizine), Claritin (loratadine), Allegra (fexofenadine), or Xyzal (levocetirizine) daily as needed.  May use Flonase (fluticasone) nasal spray 1 spray per nostril twice a day as needed for nasal congestion.   May use Afrin for short periods for nasal congestion.

## 2020-02-13 NOTE — Assessment & Plan Note (Signed)
Oranges, lemons, strawberries, spicy foods and cinnamon cause coughing at times. Denies heartburn.   Today's skin testing was negative to oranges, strawberries and cinnamon.  Monitor symptoms.   Concerning for heartburn or reactive airway disease for the coughing rather than a true IgE mediated food allergy.

## 2020-02-23 ENCOUNTER — Ambulatory Visit: Payer: Medicare Other

## 2020-02-26 ENCOUNTER — Other Ambulatory Visit: Payer: Self-pay

## 2020-02-27 ENCOUNTER — Encounter: Payer: Self-pay | Admitting: Adult Health

## 2020-02-27 ENCOUNTER — Ambulatory Visit (INDEPENDENT_AMBULATORY_CARE_PROVIDER_SITE_OTHER): Payer: Medicare Other | Admitting: Adult Health

## 2020-02-27 VITALS — BP 110/72 | Temp 98.0°F | Wt 201.0 lb

## 2020-02-27 DIAGNOSIS — D492 Neoplasm of unspecified behavior of bone, soft tissue, and skin: Secondary | ICD-10-CM

## 2020-02-27 DIAGNOSIS — L03011 Cellulitis of right finger: Secondary | ICD-10-CM | POA: Diagnosis not present

## 2020-02-27 MED ORDER — CEPHALEXIN 500 MG PO CAPS: 500.0000 mg | ORAL_CAPSULE | Freq: Three times a day (TID) | ORAL | 0 refills | Status: AC

## 2020-02-27 NOTE — Progress Notes (Signed)
   Subjective:    Patient ID: Dean Thompson, male    DOB: 28-Jul-1950, 70 y.o.   MRN: [id number]  HPI    Review of Systems     Objective:   Physical Exam        Assessment & Plan:

## 2020-02-27 NOTE — Patient Instructions (Addendum)
I have prescribed a course of antibiotics call keflex for the paronychia. Also soak your hand in warm water for 15 minutes, three times a day for the next week       Paronychia Paronychia is an infection of the skin. It happens near a fingernail or toenail. It may cause pain and swelling around the nail. In some cases, a fluid-filled bump (abscess) can form near or under the nail. Usually, this condition is not serious, and it clears up with treatment. Follow these instructions at home: Wound care  Keep the affected area clean.  Soak the fingers or toes in warm water as told by your doctor. You may be told to do this for 20 minutes, 2-3 times a day.  Keep the area dry when you are not soaking it.  Do not try to drain a fluid-filled bump on your own.  Follow instructions from your doctor about how to take care of the affected area. Make sure you: ? Wash your hands with soap and water before you change your bandage (dressing). If you cannot use soap and water, use hand sanitizer. ? Change your bandage as told by your doctor.  If you had a fluid-filled bump and your doctor drained it, check the area every day for signs of infection. Check for: ? Redness, swelling, or pain. ? Fluid or blood. ? Warmth. ? Pus or a bad smell. Medicines  Take over-the-counter and prescription medicines only as told by your doctor.  If you were prescribed an antibiotic medicine, take it as told by your doctor. Do not stop taking it even if you start to feel better.   General instructions  Avoid touching any chemicals.  Do not pick at the affected area. Prevention  To prevent this condition from happening again: ? Wear rubber gloves when putting your hands in water for washing dishes or other tasks. ? Wear gloves if your hands might touch cleaners or chemicals. ? Avoid injuring your nails or fingertips. ? Do not bite your nails or tear hangnails. ? Do not cut your nails very short. ? Do not cut  the skin at the base and sides of the nail (cuticles). ? Use clean nail clippers or scissors when trimming nails. Contact a doctor if:  You feel worse.  You do not get better.  You have more fluid, blood, or pus coming from the affected area.  Your finger or knuckle is swollen or is hard to move. Get help right away if you have:  A fever or chills.  Redness spreading from the affected area.  Pain in a joint or muscle. Summary  Paronychia is an infection of the skin. It happens near a fingernail or toenail.  This condition may cause pain and swelling around the nail.  Soak the fingers or toes in warm water as told by your doctor.  Usually, this condition is not serious, and it clears up with treatment. This information is not intended to replace advice given to you by your health care provider. Make sure you discuss any questions you have with your health care provider. Document Revised: 11/29/2019 Document Reviewed: 11/29/2019 Elsevier Patient Education  2021 [employer].

## 2020-02-27 NOTE — Progress Notes (Signed)
Subjective:    Patient ID: Dean Thompson, male    DOB: Dec 10, 1950, 70 y.o.   MRN: [id number]  HPI  70 year old male who  has a past medical history of Allergy, Hyperlipidemia, and Rotator cuff tear.  He presents to the office today for acute issue of an infection on his finger.  He reports that he first noticed about 3 weeks ago.  Located above the nailbed on his right middle finger.  Does have some mild discomfort.  Has not noticed any drainage.  Not been using anything over-the-counter  He also hastwo benign skin lesions on the right side of his neck that he would like burned off.  They itch and become irritated from clothing  Review of Systems See HPI   Past Medical History:  Diagnosis Date  . Allergy   . Hyperlipidemia   . Rotator cuff tear    left    Social History   Socioeconomic History  . Marital status: Divorced    Spouse name: Not on file  . Number of children: Not on file  . Years of education: Not on file  . Highest education level: Not on file  Occupational History  . Not on file  Tobacco Use  . Smoking status: Never Smoker  . Smokeless tobacco: Never Used  Vaping Use  . Vaping Use: Never used  Substance and Sexual Activity  . Alcohol use: Not Currently  . Drug use: Never  . Sexual activity: Not on file  Other Topics Concern  . 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       Social Determinants of Health   Financial Resource Strain: Low Risk   . Difficulty of Paying Living Expenses: Not hard at all  Food Insecurity: No Food Insecurity  . Worried About Charity fundraiser in the Last Year: Never true  . Ran Out of Food in the Last Year: Never true  Transportation Needs: No Transportation Needs  . Lack of Transportation (Medical): No  . Lack of Transportation (Non-Medical): No  Physical Activity: Sufficiently Active   . Days of Exercise per Week: 3 days  . Minutes of Exercise per Session: 60 min  Stress: No Stress Concern Present  . Feeling of Stress : Not at all  Social Connections: Moderately Integrated  . Frequency of Communication with Friends and Family: More than three times a week  . Frequency of Social Gatherings with Friends and Family: More than three times a week  . Attends Religious Services: More than 4 times per year  . Active Member of Clubs or Organizations: No  . Attends Archivist Meetings: Never  . Marital Status: Married  Human resources officer Violence: Not At Risk  . Fear of Current or Ex-Partner: No  . Emotionally Abused: No  . Physically Abused: No  . Sexually Abused: No    Past Surgical History:  Procedure Laterality Date  . COLONOSCOPY  2004   in [place] and was normal per pt  . CYSTECTOMY  2006   subacious cyst  remove from neck  . ROTATOR CUFF REPAIR Right 1999  . SEPTOPLASTY  1982  . SHOULDER OPEN ROTATOR CUFF REPAIR  10/01/2011   Procedure: ROTATOR CUFF REPAIR SHOULDER OPEN;  Surgeon: Magnus Sinning, MD;  Location: [facility];  Service: Orthopedics;  Laterality: Left;  Left Shoulder Acrominectomy/Open Rotator Cuff Repair/Distal Clavicle Resection  . SPINE SURGERY  2018    Family History  Adopted: Yes  Problem Relation Age of Onset  . Heart disease Father   . Colon cancer Neg Hx   . Colon polyps Neg Hx   . Rectal cancer Neg Hx   . Stomach cancer Neg Hx   . Asthma Neg Hx   . Allergic rhinitis Neg Hx   . Immunodeficiency Neg Hx   . Eczema Neg Hx   . Urticaria Neg Hx   . Angioedema Neg Hx     Allergies  Allergen Reactions  . Wasp Venom Anaphylaxis  . Citrus Other (See Comments)    "nasal congestion"  . Other     Spicy foods and tomato based foods cause a cough, uses Loratadine with relief    Current Outpatient Medications on File Prior to Visit  Medication Sig Dispense Refill  . albuterol (VENTOLIN HFA) 108 (90 Base) MCG/ACT inhaler Inhale 2  puffs into the lungs every 4 (four) hours as needed for wheezing or shortness of breath (coughing fits). 8 g 2  . EPINEPHrine 0.3 mg/0.3 mL IJ SOAJ injection Inject 0.3 mLs (0.3 mg total) into the muscle as needed for anaphylaxis. 2 each 1  . Multiple Vitamin (MULTIVITAMIN WITH MINERALS) TABS Take 1 tablet by mouth every morning.     No current facility-administered medications on file prior to visit.    BP 110/72   Temp 98 F (36.7 C)   Wt 201 lb (91.2 kg)   BMI 28.84 kg/m       Objective:   Physical Exam Vitals and nursing note reviewed.  Constitutional:      Appearance: Normal appearance.  Skin:    General: Skin is warm and dry.     Comments: Paronychia noted above nailbed on right middle finger  Does have keratosis noted on the right neck  Neurological:     General: No focal deficit present.     Mental Status: He is alert and oriented to person, place, and time.  Psychiatric:        Mood and Affect: Mood normal.        Behavior: Behavior normal.        Thought Content: Thought content normal.        Judgment: Judgment normal.       Assessment & Plan:  1. Paronychia of finger of right hand  - cephALEXin (KEFLEX) 500 MG capsule; Take 1 capsule (500 mg total) by mouth 3 (three) times daily for 10 days.  Dispense: 30 capsule; Refill: 0 -Advised to soak his hand in warm water for 15 minutes 3 times daily x1 week - Follow-up if no improvement or antibiotic therapy  2. Skin neoplasm -Verbal consent received for cryotherapy.  Using 3 freeze/thaw cycles, benign skin neoplasms were frozen.  Patient tolerated procedure well.  Aftercare instructions given  [name], NP

## 2020-03-18 ENCOUNTER — Telehealth (INDEPENDENT_AMBULATORY_CARE_PROVIDER_SITE_OTHER): Payer: Medicare Other | Admitting: Family Medicine

## 2020-03-18 ENCOUNTER — Encounter: Payer: Self-pay | Admitting: Family Medicine

## 2020-03-18 DIAGNOSIS — R059 Cough, unspecified: Secondary | ICD-10-CM | POA: Diagnosis not present

## 2020-03-18 MED ORDER — MONTELUKAST SODIUM 10 MG PO TABS: 10.0000 mg | ORAL_TABLET | Freq: Every day | ORAL | 0 refills | Status: DC

## 2020-03-18 MED ORDER — CETIRIZINE HCL 10 MG PO TABS: 10.0000 mg | ORAL_TABLET | Freq: Every day | ORAL | 0 refills | Status: DC

## 2020-03-18 NOTE — Progress Notes (Signed)
Subjective:    Patient ID: Dean Thompson, male    DOB: September 17, 1950, 70 y.o.   MRN: [id number]  HPI Virtual Visit via Video Note  I connected with the patient on 03/18/20 at 11:00 AM EST by a video enabled telemedicine application and verified that I am speaking with the correct person using two identifiers.  Location patient: home Location provider:work or home office Persons participating in the virtual visit: patient, provider  I discussed the limitations of evaluation and management by telemedicine and the availability of in person appointments. The patient expressed understanding and agreed to proceed.   HPI: Here for 2 months of intermittent chest congestion and a dry cough. He does not "feel sick" , no chest pain or SOB. No wheezing. No fever or ST or body aches. No NVD. He sees Dr. Rexene Alberts for allergies, and he had negative skin testing last month. He takes Mucinex off and on with little relief. No heartburn or indigestion. He has tried an albuterol inhaler with no response.   ROS: See pertinent positives and negatives per HPI.  Past Medical History:  Diagnosis Date  . Allergy   . Hyperlipidemia   . Rotator cuff tear    left    Past Surgical History:  Procedure Laterality Date  . COLONOSCOPY  2004   in [place] and was normal per pt  . CYSTECTOMY  2006   subacious cyst  remove from neck  . ROTATOR CUFF REPAIR Right 1999  . SEPTOPLASTY  1982  . SHOULDER OPEN ROTATOR CUFF REPAIR  10/01/2011   Procedure: ROTATOR CUFF REPAIR SHOULDER OPEN;  Surgeon: Magnus Sinning, MD;  Location: [facility];  Service: Orthopedics;  Laterality: Left;  Left Shoulder Acrominectomy/Open Rotator Cuff Repair/Distal Clavicle Resection  . SPINE SURGERY  2018    Family History  Adopted: Yes  Problem Relation Age of Onset  . Heart disease Father   . Colon cancer Neg Hx   . Colon polyps Neg Hx   . Rectal cancer Neg Hx   . Stomach cancer Neg Hx   . Asthma Neg Hx   . Allergic rhinitis Neg Hx    . Immunodeficiency Neg Hx   . Eczema Neg Hx   . Urticaria Neg Hx   . Angioedema Neg Hx      Current Outpatient Medications:  .  albuterol (VENTOLIN HFA) 108 (90 Base) MCG/ACT inhaler, Inhale 2 puffs into the lungs every 4 (four) hours as needed for wheezing or shortness of breath (coughing fits)., Disp: 8 g, Rfl: 2 .  cetirizine (ZYRTEC) 10 MG tablet, Take 1 tablet (10 mg total) by mouth daily., Disp: 30 tablet, Rfl: 0 .  EPINEPHrine 0.3 mg/0.3 mL IJ SOAJ injection, Inject 0.3 mLs (0.3 mg total) into the muscle as needed for anaphylaxis., Disp: 2 each, Rfl: 1 .  montelukast (SINGULAIR) 10 MG tablet, Take 1 tablet (10 mg total) by mouth at bedtime., Disp: 30 tablet, Rfl: 0 .  Multiple Vitamin (MULTIVITAMIN WITH MINERALS) TABS, Take 1 tablet by mouth every morning., Disp: , Rfl:   EXAM:  VITALS per patient if applicable:  GENERAL: alert, oriented, appears well and in no acute distress  HEENT: atraumatic, conjunttiva clear, no obvious abnormalities on inspection of external nose and ears  NECK: normal movements of the head and neck  LUNGS: on inspection no signs of respiratory distress, breathing rate appears normal, no obvious gross SOB, gasping or wheezing  CV: no obvious cyanosis  MS: moves all visible extremities  without noticeable abnormality  PSYCH/NEURO: pleasant and cooperative, no obvious depression or anxiety, speech and thought processing grossly intact  ASSESSMENT AND PLAN: Chest congestion and cough, likely allergic in nature. He will try taking a Zyrtec and a Montelukast daily. Follow up with [facility].  Alysia Penna, MD   Discussed the following assessment and plan:  No diagnosis found.     I discussed the assessment and treatment plan with the patient. The patient was provided an opportunity to ask questions and all were answered. The patient agreed with the plan and demonstrated an understanding of the instructions.   The patient was advised to call back or  seek an in-person evaluation if the symptoms worsen or if the condition fails to improve as anticipated.     Review of Systems     Objective:   Physical Exam        Assessment & Plan:

## 2020-03-20 DIAGNOSIS — Z91038 Other insect allergy status: Secondary | ICD-10-CM | POA: Diagnosis not present

## 2020-03-24 LAB — ALLERGEN HYMENOPTERA PANEL
Bumblebee: <0.1 kU/L
Honeybee IgE: <0.1 kU/L
Hornet, White Face, IgE: <0.1 kU/L
Hornet, Yellow, IgE: <0.1 kU/L
Paper Wasp IgE: <0.1 kU/L
Yellow Jacket, IgE: <0.1 kU/L

## 2020-03-24 LAB — ALLERGEN FIRE ANT: I070-IgE Fire Ant (Invicta): <0.1 kU/L

## 2020-03-24 LAB — TRYPTASE: Tryptase: 4.6 ug/L (ref 2.2–13.2)

## 2020-04-26 ENCOUNTER — Other Ambulatory Visit: Payer: Self-pay | Admitting: Allergy

## 2020-04-26 MED ORDER — MOMETASONE FUROATE 0.1 % EX OINT: TOPICAL_OINTMENT | Freq: Every day | CUTANEOUS | 0 refills | Status: DC | PRN

## 2020-04-26 MED ORDER — HYDROXYZINE HCL 25 MG PO TABS: 25.0000 mg | ORAL_TABLET | Freq: Four times a day (QID) | ORAL | 0 refills | Status: DC | PRN

## 2020-04-26 NOTE — Progress Notes (Signed)
Called by pt due to itch and rash he states is mostly on legs/shin.  He states the rash/itch has been ongoing and he has upcoming appt with Dr. Maudie Mercury at end of the month.  He is in [facility] area currently and states isn't taking or applying anything for the itch/rash at this time.   He states when the rash started he did receive prednisone but states it didn't seem to help at all.  He states he did at some point receive a topical cream that was "cortisone on steroids" but he doesn't recall the name.  It appears per chart review that he has had hydroxyzine with good respons and thus will send it in.  Will also provide with elocon for topical use to see if this will also help.   Per Dr. Maudie Mercury if rash/itch persist recommend dermatology.

## 2020-04-29 ENCOUNTER — Other Ambulatory Visit: Payer: Self-pay

## 2020-04-30 ENCOUNTER — Encounter: Payer: Self-pay | Admitting: Adult Health

## 2020-04-30 ENCOUNTER — Ambulatory Visit (INDEPENDENT_AMBULATORY_CARE_PROVIDER_SITE_OTHER): Payer: Medicare Other | Admitting: Adult Health

## 2020-04-30 VITALS — BP 120/82 | HR 63 | Temp 97.7°F | Wt 207.8 lb

## 2020-04-30 DIAGNOSIS — M542 Cervicalgia: Secondary | ICD-10-CM | POA: Diagnosis not present

## 2020-04-30 DIAGNOSIS — G8929 Other chronic pain: Secondary | ICD-10-CM

## 2020-04-30 DIAGNOSIS — M545 Low back pain, unspecified: Secondary | ICD-10-CM

## 2020-04-30 DIAGNOSIS — L03011 Cellulitis of right finger: Secondary | ICD-10-CM | POA: Diagnosis not present

## 2020-04-30 DIAGNOSIS — M2550 Pain in unspecified joint: Secondary | ICD-10-CM

## 2020-04-30 LAB — C-REACTIVE PROTEIN: CRP: <1 mg/dL (ref 0.5–20.0)

## 2020-04-30 LAB — SEDIMENTATION RATE: Sed Rate: 4 mm/hr (ref 0–20)

## 2020-04-30 MED ORDER — CYCLOBENZAPRINE HCL 10 MG PO TABS: 10.0000 mg | ORAL_TABLET | Freq: Three times a day (TID) | ORAL | 0 refills | Status: DC | PRN

## 2020-04-30 NOTE — Progress Notes (Signed)
Subjective:    Patient ID: Dean Thompson, male    DOB: 08/13/1950, 70 y.o.   MRN: [id number]  HPI 70 year old male who  has a past medical history of Allergy, Hyperlipidemia, and Rotator cuff tear.  He presents to the office today for multiple issues   1.  Was seen on 02/27/2020 for a paronychia of his right middle finger.  This was located above the nailbed.  He was prescribed Keflex 500 mg 3 times daily x10 days.  Infection resolved but currently has area of hard tissue that has been growing from his cuticle causing indention of the nail. He has mild discomfort with pressure and more significant discomfort when this tissue gets snagged.   2. Neck Pain -reports that over the last 6 months he has had intermittent episodes of jabbing" pain at the base of the skull.  This happens multiple times a week, only lasting a few moments.  When symptoms appear he does range of motion exercises with his neck and this resolves.  Denies headaches or blurred vision   3.  Worsening joint pain/low back pain -history of vital surgery back in 2018.  Reports he has a recently he has had some worsening back pain that makes it difficult to bend at the waist and grab things off the floor.  Also reports worsening joint pain in bilateral hands/fingers as well as bilateral ankles.  He does report loss of grip strength when trying to open jars.  He is concerned for psoriatic arthritis.  He has not noticed any psoriasis rashes throughout his body.   Review of Systems See HPI   Past Medical History:  Diagnosis Date  . Allergy   . Hyperlipidemia   . Rotator cuff tear    left    Social History   Socioeconomic History  . Marital status: Divorced    Spouse name: Not on file  . Number of children: Not on file  . Years of education: Not on file  . Highest education level: Not on file  Occupational History  . Not on file  Tobacco Use  . Smoking status: Never Smoker  . Smokeless tobacco: Never Used  Vaping Use   . Vaping Use: Never used  Substance and Sexual Activity  . Alcohol use: Not Currently  . Drug use: Never  . Sexual activity: Not on file  Other Topics Concern  . 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       Social Determinants of Health   Financial Resource Strain: Low Risk   . Difficulty of Paying Living Expenses: Not hard at all  Food Insecurity: No Food Insecurity  . Worried About Charity fundraiser in the Last Year: Never true  . Ran Out of Food in the Last Year: Never true  Transportation Needs: No Transportation Needs  . Lack of Transportation (Medical): No  . Lack of Transportation (Non-Medical): No  Physical Activity: Sufficiently Active  . Days of Exercise per Week: 3 days  . Minutes of Exercise per Session: 60 min  Stress: No Stress Concern Present  . Feeling of Stress : Not at all  Social Connections: Moderately Integrated  . Frequency of Communication with Friends and Family: More than three times a week  . Frequency of Social Gatherings  with Friends and Family: More than three times a week  . Attends Religious Services: More than 4 times per year  . Active Member of Clubs or Organizations: No  . Attends Archivist Meetings: Never  . Marital Status: Married  Human resources officer Violence: Not At Risk  . Fear of Current or Ex-Partner: No  . Emotionally Abused: No  . Physically Abused: No  . Sexually Abused: No    Past Surgical History:  Procedure Laterality Date  . COLONOSCOPY  2004   in [place] and was normal per pt  . CYSTECTOMY  2006   subacious cyst  remove from neck  . ROTATOR CUFF REPAIR Right 1999  . SEPTOPLASTY  1982  . SHOULDER OPEN ROTATOR CUFF REPAIR  10/01/2011   Procedure: ROTATOR CUFF REPAIR SHOULDER OPEN;  Surgeon: Magnus Sinning, MD;  Location: [facility];  Service: Orthopedics;  Laterality: Left;  Left  Shoulder Acrominectomy/Open Rotator Cuff Repair/Distal Clavicle Resection  . SPINE SURGERY  2018    Family History  Adopted: Yes  Problem Relation Age of Onset  . Heart disease Father   . Colon cancer Neg Hx   . Colon polyps Neg Hx   . Rectal cancer Neg Hx   . Stomach cancer Neg Hx   . Asthma Neg Hx   . Allergic rhinitis Neg Hx   . Immunodeficiency Neg Hx   . Eczema Neg Hx   . Urticaria Neg Hx   . Angioedema Neg Hx     Allergies  Allergen Reactions  . Wasp Venom Anaphylaxis  . Citrus Other (See Comments)    "nasal congestion"  . Other     Spicy foods and tomato based foods cause a cough, uses Loratadine with relief    Current Outpatient Medications on File Prior to Visit  Medication Sig Dispense Refill  . albuterol (VENTOLIN HFA) 108 (90 Base) MCG/ACT inhaler Inhale 2 puffs into the lungs every 4 (four) hours as needed for wheezing or shortness of breath (coughing fits). 8 g 2  . cetirizine (ZYRTEC) 10 MG tablet Take 1 tablet (10 mg total) by mouth daily. 30 tablet 0  . EPINEPHrine 0.3 mg/0.3 mL IJ SOAJ injection Inject 0.3 mLs (0.3 mg total) into the muscle as needed for anaphylaxis. 2 each 1  . hydrOXYzine (ATARAX/VISTARIL) 25 MG tablet Take 1 tablet (25 mg total) by mouth every 6 (six) hours as needed for itching. 30 tablet 0  . mometasone (ELOCON) 0.1 % ointment Apply topically daily as needed. Apply thin layer to rash areas 45 g 0  . montelukast (SINGULAIR) 10 MG tablet Take 1 tablet (10 mg total) by mouth at bedtime. 30 tablet 0  . Multiple Vitamin (MULTIVITAMIN WITH MINERALS) TABS Take 1 tablet by mouth every morning.     No current facility-administered medications on file prior to visit.    BP 120/82 (BP Location: Left Arm, Patient Position: Sitting, Cuff Size: Normal)   Pulse 63   Temp 97.7 F (36.5 C) (Oral)   Wt 207 lb 12.8 oz (94.3 kg)   SpO2 97%   BMI 29.82 kg/m       Objective:   Physical Exam Vitals and nursing note reviewed.  Constitutional:       Appearance: Normal appearance.  Musculoskeletal:        General: Tenderness present. Normal range of motion.     Comments: Tight right sided trapezius    Skin:    General: Skin is warm and dry.  Comments: Keratin-like tissue growing from cuticle above his nail on the right middle finger.  Does have tenderness with palpation.  Neurological:     General: No focal deficit present.     Mental Status: He is alert and oriented to person, place, and time.  Psychiatric:        Mood and Affect: Mood normal.        Behavior: Behavior normal.        Thought Content: Thought content normal.        Judgment: Judgment normal.        Assessment & Plan:  1. Neck pain - appears muscular in origin  - cyclobenzaprine (FLEXERIL) 10 MG tablet; Take 1 tablet (10 mg total) by mouth 3 (three) times daily as needed for muscle spasms.  Dispense: 30 tablet; Refill: 0  2. Chronic bilateral low back pain without sciatica - Likely osteoarthritis but due to multiple joint pain will check  ANA, Sed Rate, C reactive protein.  He has not been taking anything over-the-counter, advised anti-inflammatory medications.  Can refer to physical therapy.  He would like to see what his lab tests come out first and then decide on what to do - ANA; Future - Sedimentation Rate; Future - C-reactive Protein; Future - C-reactive Protein - Sedimentation Rate - ANA  3. Multiple joint pain  - ANA; Future - Sedimentation Rate; Future - C-reactive Protein; Future - C-reactive Protein - Sedimentation Rate - ANA  4. Paronychia of finger of right hand  - Ambulatory referral to Hand Surgery   [name], NP

## 2020-05-02 ENCOUNTER — Other Ambulatory Visit: Payer: Self-pay | Admitting: Orthopedic Surgery

## 2020-05-02 DIAGNOSIS — M71341 Other bursal cyst, right hand: Secondary | ICD-10-CM | POA: Diagnosis not present

## 2020-05-02 LAB — ANA: Anti Nuclear Antibody (ANA): NEGATIVE

## 2020-05-13 ENCOUNTER — Other Ambulatory Visit: Payer: Self-pay

## 2020-05-13 ENCOUNTER — Encounter (HOSPITAL_BASED_OUTPATIENT_CLINIC_OR_DEPARTMENT_OTHER): Payer: Self-pay | Admitting: Orthopedic Surgery

## 2020-05-15 NOTE — Progress Notes (Signed)

## 2020-05-16 ENCOUNTER — Ambulatory Visit (INDEPENDENT_AMBULATORY_CARE_PROVIDER_SITE_OTHER): Payer: Medicare Other | Admitting: Allergy

## 2020-05-16 ENCOUNTER — Other Ambulatory Visit: Payer: Self-pay

## 2020-05-16 ENCOUNTER — Encounter: Payer: Self-pay | Admitting: Allergy

## 2020-05-16 VITALS — BP 108/76 | HR 68 | Temp 95.5°F | Resp 16

## 2020-05-16 DIAGNOSIS — Z91038 Other insect allergy status: Secondary | ICD-10-CM | POA: Diagnosis not present

## 2020-05-16 DIAGNOSIS — J310 Chronic rhinitis: Secondary | ICD-10-CM

## 2020-05-16 DIAGNOSIS — L282 Other prurigo: Secondary | ICD-10-CM | POA: Diagnosis not present

## 2020-05-16 DIAGNOSIS — R059 Cough, unspecified: Secondary | ICD-10-CM | POA: Diagnosis not present

## 2020-05-16 DIAGNOSIS — R12 Heartburn: Secondary | ICD-10-CM | POA: Diagnosis not present

## 2020-05-16 MED ORDER — HYDROXYZINE HCL 10 MG PO TABS: 10.0000 mg | ORAL_TABLET | Freq: Every evening | ORAL | 2 refills | Status: DC | PRN

## 2020-05-16 MED ORDER — OMEPRAZOLE 20 MG PO CPDR: 20.0000 mg | DELAYED_RELEASE_CAPSULE | Freq: Every day | ORAL | 2 refills | Status: DC

## 2020-05-16 NOTE — Patient Instructions (Addendum)
Pruritic rash:   If it flares again, may need to see dermatology for further evaluation.  Take pictures.   See below for proper skin care.   Take hydroxyzine 10mg  at night as needed.   Coughing:  Most likely reflux related. See below for lifestyle and dietary modifications.  Start omeprazole 20mg  daily in the morning. Nothing to eat or drink for 30 minutes afterwards.   Rhinitis:   May use Flonase (fluticasone) nasal spray 1 spray per nostril twice a day as needed for nasal congestion.   May use Afrin for short periods for nasal congestion.   Venom allergy:  For mild symptoms you can take over the counter antihistamines such as Benadryl and monitor symptoms closely. If symptoms worsen or if you have severe symptoms including breathing issues, throat closure, significant swelling, whole body hives, severe diarrhea and vomiting, lightheadedness then inject epinephrine and seek immediate medical care afterwards.  Emergency action plan in place.   Consider venom allergy skin testing in the future - this is done in our [facility] office every other month.  Follow up in 3 months or sooner if needed.   Skin care recommendations  Bath time: . Always use lukewarm water. AVOID very hot or cold water. Marland Kitchen Keep bathing time to 5-10 minutes. . Do NOT use bubble bath. . Use a mild soap and use just enough to wash the dirty areas. . Do NOT scrub skin vigorously.  . After bathing, pat dry your skin with a towel. Do NOT rub or scrub the skin.  Moisturizers and prescriptions:  . ALWAYS apply moisturizers immediately after bathing (within 3 minutes). This helps to lock-in moisture. . Use the moisturizer several times a day over the whole body. Kermit Balo summer moisturizers include: Aveeno, CeraVe, Cetaphil. Kermit Balo winter moisturizers include: Aquaphor, Vaseline, Cerave, Cetaphil, Eucerin, Vanicream. . When using moisturizers along with medications, the moisturizer should be applied about one  hour after applying the medication to prevent diluting effect of the medication or moisturize around where you applied the medications. When not using medications, the moisturizer can be continued twice daily as maintenance.  Laundry and clothing: . Avoid laundry products with added color or perfumes. . Use unscented hypo-allergenic laundry products such as Tide free, Cheer free & gentle, and All free and clear.  . If the skin still seems dry or sensitive, you can try double-rinsing the clothes. . Avoid tight or scratchy clothing such as wool. . Do not use fabric softeners or dyer sheets.    Heartburn Heartburn is a type of pain or discomfort that can happen in your throat or chest. It is often described as a burning pain. It may also cause a bad, acid-like taste in your mouth. It may be caused by stomach contents that move back up (reflux) into the part of the body that moves food from your mouth to your stomach (esophagus). Heartburn may feel worse:  When you lie down.  When you bend over.  At night. Follow these instructions at home: Eating and drinking  Avoid certain foods and drinks as told by your doctor. This may include: ? Coffee and tea, with or without caffeine. ? Drinks that have alcohol. ? Energy drinks and sports drinks. ? Carbonated drinks or sodas. ? Chocolate and cocoa. ? Peppermint and mint flavorings. ? Garlic and onions. ? Horseradish. ? Spicy and acidic foods, such as:  Peppers.  Chili powder and curry powder.  Vinegar.  Hot sauces and BBQ sauce. ? Citrus  fruit juices and citrus fruits, such as:  Oranges.  Lemons.  Limes. ? Tomato-based foods, such as:  Red sauce and pizza with red sauce.  Chili.  Salsa. ? Fried and fatty foods, such as:  Donuts.  [place] fries and potato chips.  High-fat dressings. ? High-fat meats, such as:  Hot dogs and sausage.  Rib eye steak.  Ham and bacon. ? High-fat dairy items, such as:  Whole  milk.  Butter.  Cream cheese.  Eat small meals often. Avoid eating large meals.  Avoid drinking large amounts of liquid with your meals.  Avoid eating meals during the 2-3 hours before bedtime.  Avoid lying down right after you eat.  Do not exercise right after you eat.   Lifestyle  If you are overweight, lose an amount of weight that is healthy for you. Ask your doctor about a safe weight loss goal.  Do not smoke or use any products that contain nicotine or tobacco. These can make your symptoms worse. If you need help quitting, ask your doctor.  Wear loose clothes. Do not wear anything tight around your waist.  Raise (elevate) the head of your bed about 6 inches (15 cm) when you sleep. You can use a wedge to do this.  Try to lower your stress. If you need help doing this, ask your doctor.      Medicines  Take over-the-counter and prescription medicines only as told by your doctor.  Do not take aspirin or NSAIDs, such as ibuprofen, unless your doctor says it is okay.  Stop medicines only as told by your doctor. If you stop taking some medicines too quickly, your symptoms may get worse. General instructions  Watch for any changes in your symptoms.  Keep all follow-up visits. Contact a doctor if:  You have new symptoms.  You lose weight and you do not know why.  You have trouble swallowing, or it hurts to swallow.  You have wheezing or a cough that keeps happening.  Your symptoms do not get better with treatment.  You have heartburn often for more than 2 weeks. Get help right away if:  You have pain in your arms, neck, jaw, teeth, or back all of a sudden.  You feel sweaty, dizzy, or light-headed all of a sudden.  You have chest pain or shortness of breath.  You vomit and your vomit looks like blood or coffee grounds.  Your poop (stool) is bloody or black. These symptoms may be an emergency. Get help right away. Call your local emergency services (911 in  the [place]  Do not wait to see if the symptoms will go away.  Do not drive yourself to the hospital. Summary  Heartburn is a type of pain that can happen in your throat or chest. It can feel like a burning pain. It may also cause a bad, acid-like taste in your mouth.  You may need to avoid certain foods and drinks to help your symptoms. Ask your doctor what foods and drinks you should avoid.  Take over-the-counter and prescription medicines only as told by your doctor. Do not take aspirin or NSAIDs, such as ibuprofen, unless your doctor told you to do so.  Contact your doctor if your symptoms do not get better or they get worse. This information is not intended to replace advice given to you by your health care provider. Make sure you discuss any questions you have with your health care provider. Document Revised: 08/09/2019 Document Reviewed: 08/09/2019 Elsevier  Elsevier Patient Education  2021 Elsevier Inc.   

## 2020-05-16 NOTE — Assessment & Plan Note (Signed)
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 Used to be on injections for 6 months only. No recent stings. Interim history - bloodwork negative to hymenoptera panel.  For mild symptoms you can take over the counter antihistamines such as Benadryl and monitor symptoms closely. If symptoms worsen or if you have severe symptoms including breathing issues, throat closure, significant swelling, whole body hives, severe diarrhea and vomiting, lightheadedness then inject epinephrine and seek immediate medical care afterwards.  Emergency action plan in place.   Consider venom allergy skin testing in the future - this is done in our [facility] office every other month.

## 2020-05-16 NOTE — Assessment & Plan Note (Signed)
Past history - Episodes of coughing/hacking. Sometimes it's caused by after eating certain foods and today just had an episode in the waiting room. Denies heartburn. No prior asthma diagnosis. 2021 spirometry showed:  possible restrictive disease with 17% improvement in FEV1 post bronchodilator treatment. Clinically feeling the same but states it was easier to exhale post treatment.  Interim history - tried albuterol with no benefit. Coughing mostly after eating.   Most likely reflux related. See below for lifestyle and dietary modifications.  Start omeprazole 20mg  daily in the morning. Nothing to eat or drink for 30 minutes afterwards.

## 2020-05-16 NOTE — Assessment & Plan Note (Signed)
Past history - Pruritic rash which started 6 weeks ago.  Unknown trigger.  Took hydroxyzine, Diflucan and prednisone.  Rash has significantly improved.  Denies any changes in diet, medication or personal care products. 2021 skin testing showed: Negative to indoor/outdoor allergens and select foods. Interim history - 2 more outbreaks lasting 2 days with no known triggers. Taking hydroxyzine 25mg  daily at night and no outbreaks since then.  If it flares again, may need to see dermatology for further evaluation.  Take pictures.   See below for proper skin care.   Decrease hydroxyzine from 25mg  to 10mg  at night as needed.

## 2020-05-16 NOTE — Progress Notes (Signed)
Follow Up Note  RE: Dean Thompson MRN: [id number] DOB: 1950-05-12 Date of Office Visit: 05/16/2020  Referring provider: [name], NP Primary care provider: [name], NP  Chief Complaint: Rash (Rash has come back two times on front or legs, and itching on legs.) and Cough (Coughing is better, the only time he coughs is after he eats something.)  History of Present Illness: I had the pleasure of seeing Dean Thompson for a follow up visit at the [facility] on 05/16/2020. He is a 70 y.o. male, who is being followed for pruritic rash, coughing, chronic rhinitis, adverse food reaction and hymenoptera allergy. His previous allergy office visit was on 02/13/2020 with Dr. Maudie Mercury. Today is a regular follow up visit.  Pruritic rash Patient broke out in a rash twice on the leg - lasted for 2 days each time. Denies any changes in diet, meds or personal care products. Currently taking hydroxyzine 25mg  at night with good benefit and no outbreaks for the past 3 weeks.  Describes the rash as if ants bit him but doesn't have any good pictures of it.   Coughing Coughing occurs mainly after eating for about 10-15 minutes.  Tried albuterol with no benefit.  Triggers include tomato based products, chocolate.   Chronic rhinitis No issues and not taking any medications for this.  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 Patient still has Epipen just in case.   Assessment and Plan: Dean Thompson is a 70 y.o. male with: Pruritic rash Past history - Pruritic rash which started 6 weeks ago.  Unknown trigger.  Took hydroxyzine, Diflucan and prednisone.  Rash has significantly improved.  Denies any changes in diet, medication or personal care products. 2021 skin testing showed: Negative to indoor/outdoor allergens and select foods. Interim history - 2 more outbreaks lasting 2 days with no known triggers. Taking hydroxyzine 25mg  daily at night and no outbreaks since then.  If it flares  again, may need to see dermatology for further evaluation.  Take pictures.   See below for proper skin care.   Decrease hydroxyzine from 25mg  to 10mg  at night as needed.   Coughing Past history - Episodes of coughing/hacking. Sometimes it's caused by after eating certain foods and today just had an episode in the waiting room. Denies heartburn. No prior asthma diagnosis. 2021 spirometry showed:  possible restrictive disease with 17% improvement in FEV1 post bronchodilator treatment. Clinically feeling the same but states it was easier to exhale post treatment.  Interim history - tried albuterol with no benefit. Coughing mostly after eating.   Most likely reflux related. See below for lifestyle and dietary modifications.  Start omeprazole 20mg  daily in the morning. Nothing to eat or drink for 30 minutes afterwards.   Chronic rhinitis Past history - Some rhinitis symptoms during seasonal changes. 2021 skin testing showed: Negative to indoor/outdoor allergens. Interim history - Asymptomatic with no meds.   May use Flonase (fluticasone) nasal spray 1 spray per nostril twice a day as needed for nasal congestion.   May use Afrin for short periods for nasal congestion.   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 Used to be on injections for 6 months only. No recent stings. Interim history - bloodwork negative to hymenoptera panel.  For mild symptoms you can take over the counter antihistamines such as Benadryl and monitor symptoms closely. If symptoms worsen or if you have severe symptoms including breathing issues, throat closure, significant swelling,  whole body hives, severe diarrhea and vomiting, lightheadedness then inject epinephrine and seek immediate medical care afterwards.  Emergency action plan in place.   Consider venom allergy skin testing in the future - this is done in our [employer] office every other month.  Return in about 3 months  (around 08/15/2020).  Meds ordered this encounter  Medications  . hydrOXYzine (ATARAX/VISTARIL) 10 MG tablet    Sig: Take 1 tablet (10 mg total) by mouth at bedtime as needed for itching.    Dispense:  30 tablet    Refill:  2  . omeprazole (PRILOSEC) 20 MG capsule    Sig: Take 1 capsule (20 mg total) by mouth daily.    Dispense:  30 capsule    Refill:  2   Lab Orders  No laboratory test(s) ordered today    Diagnostics: None.   Medication List:  Current Outpatient Medications  Medication Sig Dispense Refill  . albuterol (VENTOLIN HFA) 108 (90 Base) MCG/ACT inhaler Inhale 2 puffs into the lungs every 4 (four) hours as needed for wheezing or shortness of breath (coughing fits). 8 g 2  . cyclobenzaprine (FLEXERIL) 10 MG tablet Take 1 tablet (10 mg total) by mouth 3 (three) times daily as needed for muscle spasms. 30 tablet 0  . EPINEPHrine 0.3 mg/0.3 mL IJ SOAJ injection Inject 0.3 mLs (0.3 mg total) into the muscle as needed for anaphylaxis. 2 each 1  . hydrOXYzine (ATARAX/VISTARIL) 10 MG tablet Take 1 tablet (10 mg total) by mouth at bedtime as needed for itching. 30 tablet 2  . mometasone (ELOCON) 0.1 % ointment Apply topically daily as needed. Apply thin layer to rash areas 45 g 0  . Multiple Vitamin (MULTIVITAMIN WITH MINERALS) TABS Take 1 tablet by mouth every morning.    Marland Kitchen omeprazole (PRILOSEC) 20 MG capsule Take 1 capsule (20 mg total) by mouth daily. 30 capsule 2   No current facility-administered medications for this visit.   Allergies: Allergies  Allergen Reactions  . Wasp Venom Anaphylaxis  . Citrus Other (See Comments)    "nasal congestion"  . Other     Spicy foods and tomato based foods cause a cough, uses Loratadine with relief   I reviewed his past medical history, social history, family history, and environmental history and no significant changes have been reported from his previous visit.  Review of Systems  Constitutional: Negative for appetite change,  chills, fever and unexpected weight change.  HENT: Negative for congestion and rhinorrhea.   Eyes: Negative for itching.  Respiratory: Positive for cough. Negative for chest tightness, shortness of breath and wheezing.   Cardiovascular: Negative for chest pain.  Gastrointestinal: Negative for abdominal pain.  Genitourinary: Negative for difficulty urinating.  Skin: Positive for rash.  Allergic/Immunologic: Negative for environmental allergies.  Neurological: Negative for headaches.   Objective: BP 108/76 (BP Location: Left Arm, Patient Position: Sitting, Cuff Size: Normal)   Pulse 68   Temp (!) 95.5 F (35.3 C) (Temporal)   Resp 16   SpO2 95%  There is no height or weight on file to calculate BMI. Physical Exam Vitals and nursing note reviewed.  Constitutional:      Appearance: Normal appearance. He is well-developed.  HENT:     Head: Normocephalic and atraumatic.     Right Ear: Tympanic membrane and external ear normal.     Left Ear: Tympanic membrane and external ear normal.     Nose: Nose normal.     Mouth/Throat:  Mouth: Mucous membranes are moist.     Pharynx: Oropharynx is clear.  Eyes:     Conjunctiva/sclera: Conjunctivae normal.  Cardiovascular:     Rate and Rhythm: Normal rate and regular rhythm.     Heart sounds: Normal heart sounds. No murmur heard. No friction rub. No gallop.   Pulmonary:     Effort: Pulmonary effort is normal.     Breath sounds: Normal breath sounds. No wheezing, rhonchi or rales.  Musculoskeletal:     Cervical back: Neck supple.  Skin:    General: Skin is warm.     Findings: No rash.  Neurological:     Mental Status: He is alert and oriented to person, place, and time.  Psychiatric:        Behavior: Behavior normal.    Previous notes and tests were reviewed. The plan was reviewed with the patient/family, and all questions/concerned were addressed.  It was my pleasure to see Dean Thompson today and participate in his care. Please feel  free to contact me with any questions or concerns.  Sincerely,  Rexene Alberts, DO Allergy & Immunology  [facility] office: [place] office: [phone]

## 2020-05-16 NOTE — Assessment & Plan Note (Signed)
Past history - Some rhinitis symptoms during seasonal changes. 2021 skin testing showed: Negative to indoor/outdoor allergens. Interim history - Asymptomatic with no meds.   May use Flonase (fluticasone) nasal spray 1 spray per nostril twice a day as needed for nasal congestion.   May use Afrin for short periods for nasal congestion.

## 2020-05-17 ENCOUNTER — Other Ambulatory Visit (HOSPITAL_COMMUNITY)
Admission: RE | Admit: 2020-05-17 | Discharge: 2020-05-17 | Disposition: A | Discharge status: Home / Self Care | Payer: Medicare Other | Source: Ambulatory Visit | Attending: Orthopedic Surgery | Admitting: Orthopedic Surgery

## 2020-05-17 DIAGNOSIS — Z20822 Contact with and (suspected) exposure to covid-19: Secondary | ICD-10-CM | POA: Insufficient documentation

## 2020-05-17 DIAGNOSIS — Z01812 Encounter for preprocedural laboratory examination: Secondary | ICD-10-CM | POA: Diagnosis not present

## 2020-05-17 LAB — SARS CORONAVIRUS 2 (TAT 6-24 HRS): SARS Coronavirus 2: NEGATIVE

## 2020-05-21 ENCOUNTER — Ambulatory Visit (HOSPITAL_BASED_OUTPATIENT_CLINIC_OR_DEPARTMENT_OTHER): Payer: Medicare Other | Admitting: Certified Registered"

## 2020-05-21 ENCOUNTER — Ambulatory Visit (HOSPITAL_BASED_OUTPATIENT_CLINIC_OR_DEPARTMENT_OTHER)
Admission: RE | Admit: 2020-05-21 | Discharge: 2020-05-21 | Disposition: A | Discharge status: Home / Self Care | Payer: Medicare Other | Attending: Orthopedic Surgery | Admitting: Orthopedic Surgery

## 2020-05-21 ENCOUNTER — Encounter (HOSPITAL_BASED_OUTPATIENT_CLINIC_OR_DEPARTMENT_OTHER): Payer: Self-pay | Admitting: Orthopedic Surgery

## 2020-05-21 ENCOUNTER — Other Ambulatory Visit: Payer: Self-pay

## 2020-05-21 ENCOUNTER — Encounter (HOSPITAL_BASED_OUTPATIENT_CLINIC_OR_DEPARTMENT_OTHER)
Admission: RE | Disposition: A | Discharge status: Home / Self Care | Payer: Self-pay | Source: Home / Self Care | Attending: Orthopedic Surgery

## 2020-05-21 DIAGNOSIS — M67441 Ganglion, right hand: Secondary | ICD-10-CM | POA: Diagnosis not present

## 2020-05-21 DIAGNOSIS — E785 Hyperlipidemia, unspecified: Secondary | ICD-10-CM | POA: Diagnosis not present

## 2020-05-21 DIAGNOSIS — Z8249 Family history of ischemic heart disease and other diseases of the circulatory system: Secondary | ICD-10-CM | POA: Insufficient documentation

## 2020-05-21 DIAGNOSIS — M25841 Other specified joint disorders, right hand: Secondary | ICD-10-CM | POA: Diagnosis not present

## 2020-05-21 DIAGNOSIS — Z91018 Allergy to other foods: Secondary | ICD-10-CM | POA: Diagnosis not present

## 2020-05-21 DIAGNOSIS — M19041 Primary osteoarthritis, right hand: Secondary | ICD-10-CM | POA: Diagnosis not present

## 2020-05-21 DIAGNOSIS — L728 Other follicular cysts of the skin and subcutaneous tissue: Secondary | ICD-10-CM | POA: Diagnosis present

## 2020-05-21 HISTORY — PX: EXCISION METACARPAL MASS: SHX6372

## 2020-05-21 SURGERY — EXCISION METACARPAL MASS
Anesthesia: Monitor Anesthesia Care | Laterality: Right

## 2020-05-21 MED ORDER — ONDANSETRON HCL 4 MG/2ML IJ SOLN: 4.0000 mg | Freq: Once | INTRAMUSCULAR | Status: DC | PRN

## 2020-05-21 MED ORDER — CEFAZOLIN SODIUM-DEXTROSE 2-4 GM/100ML-% IV SOLN
INTRAVENOUS | Status: AC
  Filled 2020-05-21: qty 100

## 2020-05-21 MED ORDER — ONDANSETRON HCL 4 MG/2ML IJ SOLN
INTRAMUSCULAR | Status: AC
  Filled 2020-05-21: qty 2

## 2020-05-21 MED ORDER — OXYCODONE HCL 5 MG PO TABS: 5.0000 mg | ORAL_TABLET | Freq: Once | ORAL | Status: DC | PRN

## 2020-05-21 MED ORDER — ONDANSETRON HCL 4 MG/2ML IJ SOLN
INTRAMUSCULAR | Status: DC | PRN
  Administered 2020-05-21: 4 mg via INTRAVENOUS

## 2020-05-21 MED ORDER — OXYCODONE HCL 5 MG/5ML PO SOLN
5.0000 mg | Freq: Once | ORAL | Status: DC | PRN
Start: 2020-05-21 — End: 2020-05-21

## 2020-05-21 MED ORDER — BUPIVACAINE HCL (PF) 0.25 % IJ SOLN
INTRAMUSCULAR | Status: DC | PRN
  Administered 2020-05-21: 5 mL

## 2020-05-21 MED ORDER — MIDAZOLAM HCL 2 MG/2ML IJ SOLN
INTRAMUSCULAR | Status: AC
  Filled 2020-05-21: qty 2

## 2020-05-21 MED ORDER — CEFAZOLIN SODIUM-DEXTROSE 2-4 GM/100ML-% IV SOLN
2.0000 g | INTRAVENOUS | Status: AC
  Administered 2020-05-21: 2 g via INTRAVENOUS

## 2020-05-21 MED ORDER — FENTANYL CITRATE (PF) 100 MCG/2ML IJ SOLN
INTRAMUSCULAR | Status: DC | PRN
  Administered 2020-05-21: 100 ug via INTRAVENOUS

## 2020-05-21 MED ORDER — LACTATED RINGERS IV SOLN: INTRAVENOUS | Status: DC

## 2020-05-21 MED ORDER — LIDOCAINE HCL (PF) 1 % IJ SOLN
INTRAMUSCULAR | Status: DC | PRN
  Administered 2020-05-21: 5 mL

## 2020-05-21 MED ORDER — TRAMADOL HCL 50 MG PO TABS: 50.0000 mg | ORAL_TABLET | Freq: Four times a day (QID) | ORAL | 0 refills | Status: DC | PRN

## 2020-05-21 MED ORDER — LIDOCAINE HCL (PF) 0.5 % IJ SOLN
INTRAMUSCULAR | Status: DC | PRN
  Administered 2020-05-21: 30 mL via INTRAVENOUS

## 2020-05-21 MED ORDER — SULFAMETHOXAZOLE-TRIMETHOPRIM 800-160 MG PO TABS: 1.0000 | ORAL_TABLET | Freq: Two times a day (BID) | ORAL | Status: DC

## 2020-05-21 MED ORDER — PROPOFOL 500 MG/50ML IV EMUL
INTRAVENOUS | Status: DC | PRN
  Administered 2020-05-21: 25 ug/kg/min via INTRAVENOUS

## 2020-05-21 MED ORDER — FENTANYL CITRATE (PF) 100 MCG/2ML IJ SOLN
INTRAMUSCULAR | Status: AC
  Filled 2020-05-21: qty 2

## 2020-05-21 MED ORDER — FENTANYL CITRATE (PF) 100 MCG/2ML IJ SOLN: 25.0000 ug | INTRAMUSCULAR | Status: DC | PRN

## 2020-05-21 MED ORDER — MIDAZOLAM HCL 5 MG/5ML IJ SOLN
INTRAMUSCULAR | Status: DC | PRN
  Administered 2020-05-21: 2 mg via INTRAVENOUS

## 2020-05-21 SURGICAL SUPPLY — 47 items
APL PRP STRL LF DISP 70% ISPRP (MISCELLANEOUS) ×1
BLADE MINI RND TIP GREEN BEAV (BLADE) IMPLANT
BLADE SURG 15 STRL LF DISP TIS (BLADE) ×1 IMPLANT
BLADE SURG 15 STRL SS (BLADE) ×2
BNDG CMPR 9X4 STRL LF SNTH (GAUZE/BANDAGES/DRESSINGS)
BNDG COHESIVE 1X5 TAN STRL LF (GAUZE/BANDAGES/DRESSINGS) IMPLANT
BNDG COHESIVE 2X5 TAN STRL LF (GAUZE/BANDAGES/DRESSINGS) IMPLANT
BNDG COHESIVE 3X5 TAN STRL LF (GAUZE/BANDAGES/DRESSINGS) IMPLANT
BNDG ESMARK 4X9 LF (GAUZE/BANDAGES/DRESSINGS) IMPLANT
BNDG GAUZE ELAST 4 BULKY (GAUZE/BANDAGES/DRESSINGS) IMPLANT
CHLORAPREP W/TINT 26 (MISCELLANEOUS) ×2 IMPLANT
CORD BIPOLAR FORCEPS 12FT (ELECTRODE) ×2 IMPLANT
COVER BACK TABLE 60X90IN (DRAPES) ×2 IMPLANT
COVER MAYO STAND STRL (DRAPES) ×2 IMPLANT
COVER WAND RF STERILE (DRAPES) IMPLANT
CUFF TOURN SGL QUICK 18X4 (TOURNIQUET CUFF) IMPLANT
DECANTER SPIKE VIAL GLASS SM (MISCELLANEOUS) IMPLANT
DRAIN PENROSE 1/2X12 LTX STRL (WOUND CARE) IMPLANT
DRAPE EXTREMITY T 121X128X90 (DISPOSABLE) ×2 IMPLANT
DRAPE SURG 17X23 STRL (DRAPES) ×2 IMPLANT
GAUZE SPONGE 4X4 12PLY STRL (GAUZE/BANDAGES/DRESSINGS) ×2 IMPLANT
GAUZE XEROFORM 1X8 LF (GAUZE/BANDAGES/DRESSINGS) ×2 IMPLANT
GLOVE SURG ORTHO LTX SZ8 (GLOVE) ×2 IMPLANT
GLOVE SURG UNDER POLY LF SZ8.5 (GLOVE) ×2 IMPLANT
GOWN STRL REUS W/ TWL LRG LVL3 (GOWN DISPOSABLE) ×1 IMPLANT
GOWN STRL REUS W/TWL LRG LVL3 (GOWN DISPOSABLE) ×2
GOWN STRL REUS W/TWL XL LVL3 (GOWN DISPOSABLE) ×2 IMPLANT
NDL SAFETY ECLIPSE 18X1.5 (NEEDLE) ×1 IMPLANT
NEEDLE HYPO 18GX1.5 SHARP (NEEDLE) ×2
NEEDLE PRECISIONGLIDE 27X1.5 (NEEDLE) IMPLANT
NS IRRIG 1000ML POUR BTL (IV SOLUTION) ×2 IMPLANT
PACK BASIN DAY SURGERY FS (CUSTOM PROCEDURE TRAY) ×2 IMPLANT
PAD CAST 3X4 CTTN HI CHSV (CAST SUPPLIES) IMPLANT
PADDING CAST ABS 3INX4YD NS (CAST SUPPLIES)
PADDING CAST ABS 4INX4YD NS (CAST SUPPLIES) ×1
PADDING CAST ABS COTTON 3X4 (CAST SUPPLIES) IMPLANT
PADDING CAST ABS COTTON 4X4 ST (CAST SUPPLIES) ×1 IMPLANT
PADDING CAST COTTON 3X4 STRL (CAST SUPPLIES)
SPLINT PLASTER CAST XFAST 3X15 (CAST SUPPLIES) IMPLANT
SPLINT PLASTER XTRA FASTSET 3X (CAST SUPPLIES)
STOCKINETTE 4X48 STRL (DRAPES) ×2 IMPLANT
SUT ETHILON 4 0 PS 2 18 (SUTURE) ×2 IMPLANT
SUT VIC AB 4-0 P2 18 (SUTURE) IMPLANT
SYR BULB EAR ULCER 3OZ GRN STR (SYRINGE) ×2 IMPLANT
SYR CONTROL 10ML LL (SYRINGE) IMPLANT
TOWEL GREEN STERILE FF (TOWEL DISPOSABLE) ×4 IMPLANT
UNDERPAD 30X36 HEAVY ABSORB (UNDERPADS AND DIAPERS) ×2 IMPLANT

## 2020-05-21 NOTE — H&P (Signed)
  Dean Thompson is an 70 y.o. male.   Chief Complaint:mass right middle finger HPI: Dean Thompson is a 70 year old right-hand-dominant male with a mass on the distal aspect of the right middle finger which has been present for at least 6 months.  He recalls no history of injury.  He has felt this was probably an infectious process but has had grooving of the nail he is desires having this removed.    Past Medical History:  Diagnosis Date  . Allergy   . Hyperlipidemia   . Rotator cuff tear    left    Past Surgical History:  Procedure Laterality Date  . COLONOSCOPY  2004   in [place] and was normal per pt  . CYSTECTOMY  2006   subacious cyst  remove from neck  . ROTATOR CUFF REPAIR Right 1999  . SEPTOPLASTY  1982  . SHOULDER OPEN ROTATOR CUFF REPAIR  10/01/2011   Procedure: ROTATOR CUFF REPAIR SHOULDER OPEN;  Surgeon: Magnus Sinning, MD;  Location: [facility];  Service: Orthopedics;  Laterality: Left;  Left Shoulder Acrominectomy/Open Rotator Cuff Repair/Distal Clavicle Resection  . SPINE SURGERY  2018    Family History  Adopted: Yes  Problem Relation Age of Onset  . Heart disease Father   . Colon cancer Neg Hx   . Colon polyps Neg Hx   . Rectal cancer Neg Hx   . Stomach cancer Neg Hx   . Asthma Neg Hx   . Allergic rhinitis Neg Hx   . Immunodeficiency Neg Hx   . Eczema Neg Hx   . Urticaria Neg Hx   . Angioedema Neg Hx    Social History:  reports that he has never smoked. He has never used smokeless tobacco. He reports previous alcohol use. He reports that he does not use drugs.  Allergies:  Allergies  Allergen Reactions  . Wasp Venom Anaphylaxis  . Citrus Other (See Comments)    "nasal congestion"  . Other     Spicy foods and tomato based foods cause a cough, uses Loratadine with relief    No medications prior to admission.    No results found for this or any previous visit (from the past 48 hour(s)).  No results found.   Pertinent items are noted in  HPI.  Height 5\' 10"  (1.778 m), weight 88.9 kg.  General appearance: alert, cooperative and appears stated age Head: Normocephalic, without obvious abnormality Neck: no JVD Resp: clear to auscultation bilaterally Cardio: regular rate and rhythm, S1, S2 normal, no murmur, click, rub or gallop GI: soft, non-tender; bowel sounds normal; no masses,  no organomegaly Extremities: mass right middle finger Pulses: 2+ and symmetric Skin: Skin color, texture, turgor normal. 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  Plan excision mass with debridement distal phalangeal joint Pre pari postoperative course been discussed along with risks and complications he is aware that there is no guarantee to the surgery possibility of infection recurrence injury to arteries nerves tendons incomplete relief symptoms and Daryll Brod 05/21/2020, 5:46 AM

## 2020-05-21 NOTE — Op Note (Signed)
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   ASSISTANT: none   ANESTHESIA:  Bier block with sedation and Local   INTRAVENOUS FLUIDS:  Per anesthesia flow sheet.   ESTIMATED BLOOD LOSS:  Minimal.   COMPLICATIONS:  None.   SPECIMENS:   Cyst synovial tissue   TOURNIQUET TIME:    Total Tourniquet Time Documented: Forearm (Right) - 34 minutes Total: Forearm (Right) - 34 minutes    DISPOSITION:  Stable to PACU.   INDICATIONS: Patient is a 70 year old male with a history of a mass in the distal interphalangeal joint of his right middle finger with significant deformity of his nail plate distally.  He is elected to have this surgically removed along with debridement of the joint.  Prepare postoperative course been discussed along with risk complications.  He is aware that there is no guarantee to the surgery the possibility of infection recurrence injury to arteries nerves tendons complete relief symptoms dystrophy in preoperative area the patient seen the extremity marked by both patient and surgeon antibiotic given  OPERATIVE COURSE: Patient is brought to the operating room placed in the supine position with the right arm free.  Prep was done with ChloraPrep 3-minute dry time allowed after a forearm IV regional anesthetic was carried out without difficulty under the direction the anesthesia department.  A timeout was taken to confirm patient procedure.  A metacarpal block was given quarter percent bupivacaine without epinephrine 9 cc was  used.  Curvilinear incision was made over the distal phalangeal joint of the right middle finger carried down through subcutaneous tissue.  Dissection was carried distally and a cyst immediately encountered this was then dissected free under the skin with a hemostatic rondure and house curette the cyst was excised and sent to pathology.  An incision was then made on the radial aspect of the distal phalangeal joint extensor tendon preserved and the joint entered allowing a synovectomy performed with hemostatic rondure.  The specimen was sent to pathology along with the cyst.  Wound was copious irrigated with saline.  The skin was closed interrupted 4-0 nylon sutures.  A sterile compressive dressing and splint to the finger was applied.  Deflation of the tourniquet all fingers immediately pink.  He was taken to the recovery room for observation in satisfactory condition.  He will be discharged home to return to the hand center [facility] in 1 week on Tylenol ibuprofen for pain with Ultram for breakthrough and Septra DS and that there was a opening in the distal nail fold and removal of the cyst distally.   Daryll Brod, MD Electronically signed, 05/21/20

## 2020-05-21 NOTE — Brief Op Note (Signed)
05/21/2020  9:31 AM  PATIENT:  Dean Thompson  70 y.o. male  PRE-OPERATIVE DIAGNOSIS:  MUCOID CYST RIGHT MIDDLE FINGER DISTAL INTERPHALANGEAL JOINT  POST-OPERATIVE DIAGNOSIS:  MUCOID CYST RIGHT MIDDLE FINGER DISTAL   PROCEDURE:  Procedure(s): EXCISION MUCOID CYST WITH DISTAL INTERPHALANGEAL JOINT ARTHROTOMY RIGHT MIDDLE FINGER (Right)  SURGEON:  Surgeon(s) and Role:    * Daryll Brod, MD - Primary  PHYSICIAN ASSISTANT:   ASSISTANTS: none   ANESTHESIA:   local, regional and IV sedation  EBL:  5 mL   BLOOD ADMINISTERED:none  DRAINS: none   LOCAL MEDICATIONS USED:  BUPIVICAINE   SPECIMEN:  Excision  DISPOSITION OF SPECIMEN:  PATHOLOGY  COUNTS:  YES  TOURNIQUET:   Total Tourniquet Time Documented: Forearm (Right) - 34 minutes Total: Forearm (Right) - 34 minutes   DICTATION: .Viviann Spare Dictation  PLAN OF CARE: Discharge to home after PACU  PATIENT DISPOSITION:  PACU - hemodynamically stable.

## 2020-05-21 NOTE — Discharge Instructions (Addendum)

## 2020-05-21 NOTE — Transfer of Care (Signed)
Immediate Anesthesia Transfer of Care Note  Patient: Dean Thompson  Procedure(s) Performed: EXCISION MUCOID CYST WITH DISTAL INTERPHALANGEAL JOINT ARTHROTOMY RIGHT MIDDLE FINGER (Right )  Patient Location: PACU  Anesthesia Type:Bier block  Level of Consciousness: awake and alert   Airway & Oxygen Therapy: Patient Spontanous Breathing and Patient connected to face mask oxygen  Post-op Assessment: Report given to RN and Post -op Vital signs reviewed and stable  Post vital signs: Reviewed and stable  Last Vitals:  Vitals Value Taken Time  BP    Temp    Pulse 59 05/21/20 0932  Resp 16 05/21/20 0932  SpO2 99 % 05/21/20 0932  Vitals shown include unvalidated device data.  Last Pain:  Vitals:   05/21/20 0713  TempSrc: Oral  PainSc: 0-No pain         Complications: No complications documented.

## 2020-05-21 NOTE — Anesthesia Postprocedure Evaluation (Signed)
Anesthesia Post Note  Patient: Dean Thompson  Procedure(s) Performed: EXCISION MUCOID CYST WITH DISTAL INTERPHALANGEAL JOINT ARTHROTOMY RIGHT MIDDLE FINGER (Right )     Patient location during evaluation: PACU Anesthesia Type: Bier Block Level of consciousness: awake and alert Pain management: pain level controlled Vital Signs Assessment: post-procedure vital signs reviewed and stable Respiratory status: spontaneous breathing, nonlabored ventilation and respiratory function stable Cardiovascular status: stable and blood pressure returned to baseline Anesthetic complications: no   No complications documented.  Last Vitals:  Vitals:   05/21/20 0940 05/21/20 0945  BP:  123/74  Pulse:  (!) 55  Resp:  12  Temp:    SpO2: 97% 96%    Last Pain:  Vitals:   05/21/20 0959  TempSrc:   PainSc: 0-No pain                 Audry Pili

## 2020-05-21 NOTE — Anesthesia Preprocedure Evaluation (Addendum)
Anesthesia Evaluation  Patient identified by MRN, date of birth, ID band Patient awake    Reviewed: Allergy & Precautions, NPO status , Patient's Chart, lab work & pertinent test results  History of Anesthesia Complications Negative for: history of anesthetic complications  Airway Mallampati: II  TM Distance: >3 FB Neck ROM: Full    Dental  (+) Dental Advisory Given   Pulmonary neg pulmonary ROS,    Pulmonary exam normal        Cardiovascular negative cardio ROS Normal cardiovascular exam     Neuro/Psych negative neurological ROS  negative psych ROS   GI/Hepatic negative GI ROS, Neg liver ROS,   Endo/Other  negative endocrine ROS  Renal/GU negative Renal ROS     Musculoskeletal negative musculoskeletal ROS (+)   Abdominal   Peds  Hematology negative hematology ROS (+)   Anesthesia Other Findings Covid test negative   Reproductive/Obstetrics                            Anesthesia Physical Anesthesia Plan  ASA: I  Anesthesia Plan: Bier Block and Bier Block-LIDOCAINE ONLY   Post-op Pain Management:    Induction: Intravenous  PONV Risk Score and Plan: 1 and Treatment may vary due to age or medical condition, Propofol infusion and Ondansetron  Airway Management Planned: Natural Airway and Simple Face Mask  Additional Equipment: None  Intra-op Plan:   Post-operative Plan:   Informed Consent: I have reviewed the patients History and Physical, chart, labs and discussed the procedure including the risks, benefits and alternatives for the proposed anesthesia with the patient or authorized representative who has indicated his/her understanding and acceptance.       Plan Discussed with: CRNA, Anesthesiologist and Surgeon  Anesthesia Plan Comments:      Anesthesia Quick Evaluation

## 2020-05-22 ENCOUNTER — Encounter (HOSPITAL_BASED_OUTPATIENT_CLINIC_OR_DEPARTMENT_OTHER): Payer: Self-pay | Admitting: Orthopedic Surgery

## 2020-05-22 LAB — SURGICAL PATHOLOGY

## 2020-07-18 ENCOUNTER — Other Ambulatory Visit: Payer: Self-pay

## 2020-07-18 ENCOUNTER — Other Ambulatory Visit: Payer: Self-pay | Admitting: *Deleted

## 2020-07-18 ENCOUNTER — Encounter: Payer: Self-pay | Admitting: Allergy

## 2020-07-18 ENCOUNTER — Ambulatory Visit (INDEPENDENT_AMBULATORY_CARE_PROVIDER_SITE_OTHER): Payer: Medicare Other | Admitting: Allergy

## 2020-07-18 VITALS — BP 124/72 | HR 66 | Temp 97.1°F | Resp 16

## 2020-07-18 DIAGNOSIS — Z91038 Other insect allergy status: Secondary | ICD-10-CM

## 2020-07-18 DIAGNOSIS — R12 Heartburn: Secondary | ICD-10-CM | POA: Diagnosis not present

## 2020-07-18 DIAGNOSIS — J310 Chronic rhinitis: Secondary | ICD-10-CM

## 2020-07-18 DIAGNOSIS — L282 Other prurigo: Secondary | ICD-10-CM | POA: Diagnosis not present

## 2020-07-18 MED ORDER — MOMETASONE FUROATE 0.1 % EX CREA: 1.0000 "application " | TOPICAL_CREAM | Freq: Every day | CUTANEOUS | 2 refills | Status: DC | PRN

## 2020-07-18 MED ORDER — TRIAMCINOLONE ACETONIDE 0.1 % EX OINT: 1.0000 "application " | TOPICAL_OINTMENT | Freq: Two times a day (BID) | CUTANEOUS | 5 refills | Status: DC

## 2020-07-18 MED ORDER — TRIAMCINOLONE ACETONIDE 0.1 % EX OINT: 1.0000 "application " | TOPICAL_OINTMENT | Freq: Every day | CUTANEOUS | 2 refills | Status: DC

## 2020-07-18 MED ORDER — CETIRIZINE HCL 10 MG PO TABS: 10.0000 mg | ORAL_TABLET | Freq: Every day | ORAL | 5 refills | Status: DC

## 2020-07-18 NOTE — Assessment & Plan Note (Signed)
Coughing resolved with PPI.  Continue lifestyle and dietary modifications.  Continue  omeprazole 20mg  daily in the morning. Nothing to eat or drink for 30 minutes afterwards.

## 2020-07-18 NOTE — Assessment & Plan Note (Signed)
Past history - Some rhinitis symptoms during seasonal changes. 2021 skin testing showed: Negative to indoor/outdoor allergens. Interim history - Asymptomatic with no meds.   May use Flonase (fluticasone) nasal spray 1 spray per nostril twice a day as needed for nasal congestion.   May use Afrin for short periods for nasal congestion.

## 2020-07-18 NOTE — Assessment & Plan Note (Signed)
Past history - Pruritic rash which started 6 weeks ago.  Unknown trigger.  Took hydroxyzine, Diflucan and prednisone.  Rash has significantly improved.  Denies any changes in diet, medication or personal care products. 2021 skin testing showed: Negative to indoor/outdoor allergens and select foods. Interim history - new itching/rash started 3 weeks ago.   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  Start zyrtec 10mg  daily to help with itching.   Use mometasone 0.1% cream once a day as needed for rash. Do not use on the face, neck, armpits or groin area. Do not use more than 3 weeks in a row.   Moisturize daily using the triamcinolone:Eucerin mix on the legs.  If it flares again, may need to see dermatology for further evaluation.  Take pictures.   See below for proper skin care.   Take hydroxyzine 10mg -20mg  at night as needed.

## 2020-07-18 NOTE — Assessment & Plan Note (Signed)
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 Used to be on injections for 6 months only. No recent stings. Bloodwork negative to hymenoptera panel.  For mild symptoms you can take over the counter antihistamines such as Benadryl and monitor symptoms closely. If symptoms worsen or if you have severe symptoms including breathing issues, throat closure, significant swelling, whole body hives, severe diarrhea and vomiting, lightheadedness then inject epinephrine and seek immediate medical care afterwards.  Emergency action plan in place.   Consider venom allergy skin testing in the future - this is done in our [employer] office every other month.

## 2020-07-18 NOTE — Patient Instructions (Addendum)
Pruritic rash:   Start zyrtec 10mg  daily.   Use mometasone 0.1% cream once a day as needed for rash. Do not use on the face, neck, armpits or groin area. Do not use more than 3 weeks in a row.   Moisturize daily using the triamcinolone:eucerin mix on the legs.   If it flares again, may need to see dermatology for further evaluation.  Take pictures.   See below for proper skin care.   Take hydroxyzine 10mg -20mg  at night as needed.   Coughing: Continue lifestyle and dietary modifications.  Continue  omeprazole 20mg  daily in the morning. Nothing to eat or drink for 30 minutes afterwards.   Rhinitis:   May use Flonase (fluticasone) nasal spray 1 spray per nostril twice a day as needed for nasal congestion.   May use Afrin for short periods for nasal congestion.   Venom allergy:  For mild symptoms you can take over the counter antihistamines such as Benadryl and monitor symptoms closely. If symptoms worsen or if you have severe symptoms including breathing issues, throat closure, significant swelling, whole body hives, severe diarrhea and vomiting, lightheadedness then inject epinephrine and seek immediate medical care afterwards.  Emergency action plan in place.   Consider venom allergy skin testing in the future - this is done in our [facility] office every other month.  Follow up in 3 months or sooner if needed.   Skin care recommendations  Bath time: . Always use lukewarm water. AVOID very hot or cold water. Marland Kitchen Keep bathing time to 5-10 minutes. . Do NOT use bubble bath. . Use a mild soap and use just enough to wash the dirty areas. . Do NOT scrub skin vigorously.  . After bathing, pat dry your skin with a towel. Do NOT rub or scrub the skin.  Moisturizers and prescriptions:  . ALWAYS apply moisturizers immediately after bathing (within 3 minutes). This helps to lock-in moisture. . Use the moisturizer several times a day over the whole body. Kermit Balo summer moisturizers  include: Aveeno, CeraVe, Cetaphil. Kermit Balo winter moisturizers include: Aquaphor, Vaseline, Cerave, Cetaphil, Eucerin, Vanicream. . When using moisturizers along with medications, the moisturizer should be applied about one hour after applying the medication to prevent diluting effect of the medication or moisturize around where you applied the medications. When not using medications, the moisturizer can be continued twice daily as maintenance.  Laundry and clothing: . Avoid laundry products with added color or perfumes. . Use unscented hypo-allergenic laundry products such as Tide free, Cheer free & gentle, and All free and clear.  . If the skin still seems dry or sensitive, you can try double-rinsing the clothes. . Avoid tight or scratchy clothing such as wool. . Do not use fabric softeners or dyer sheets.    Heartburn Heartburn is a type of pain or discomfort that can happen in your throat or chest. It is often described as a burning pain. It may also cause a bad, acid-like taste in your mouth. It may be caused by stomach contents that move back up (reflux) into the part of the body that moves food from your mouth to your stomach (esophagus). Heartburn may feel worse:  When you lie down.  When you bend over.  At night. Follow these instructions at home: Eating and drinking  Avoid certain foods and drinks as told by your doctor. This may include: ? Coffee and tea, with or without caffeine. ? Drinks that have alcohol. ? Energy drinks and sports drinks. ?  Carbonated drinks or sodas. ? Chocolate and cocoa. ? Peppermint and mint flavorings. ? Garlic and onions. ? Horseradish. ? Spicy and acidic foods, such as:  Peppers.  Chili powder and curry powder.  Vinegar.  Hot sauces and BBQ sauce. ? Citrus fruit juices and citrus fruits, such as:  Oranges.  Lemons.  Limes. ? Tomato-based foods, such as:  Red sauce and pizza with red sauce.  Chili.  Salsa. ? Fried and fatty  foods, such as:  Donuts.  [place] fries and potato chips.  High-fat dressings. ? High-fat meats, such as:  Hot dogs and sausage.  Rib eye steak.  Ham and bacon. ? High-fat dairy items, such as:  Whole milk.  Butter.  Cream cheese.  Eat small meals often. Avoid eating large meals.  Avoid drinking large amounts of liquid with your meals.  Avoid eating meals during the 2-3 hours before bedtime.  Avoid lying down right after you eat.  Do not exercise right after you eat.   Lifestyle  If you are overweight, lose an amount of weight that is healthy for you. Ask your doctor about a safe weight loss goal.  Do not smoke or use any products that contain nicotine or tobacco. These can make your symptoms worse. If you need help quitting, ask your doctor.  Wear loose clothes. Do not wear anything tight around your waist.  Raise (elevate) the head of your bed about 6 inches (15 cm) when you sleep. You can use a wedge to do this.  Try to lower your stress. If you need help doing this, ask your doctor.      Medicines  Take over-the-counter and prescription medicines only as told by your doctor.  Do not take aspirin or NSAIDs, such as ibuprofen, unless your doctor says it is okay.  Stop medicines only as told by your doctor. If you stop taking some medicines too quickly, your symptoms may get worse. General instructions  Watch for any changes in your symptoms.  Keep all follow-up visits. Contact a doctor if:  You have new symptoms.  You lose weight and you do not know why.  You have trouble swallowing, or it hurts to swallow.  You have wheezing or a cough that keeps happening.  Your symptoms do not get better with treatment.  You have heartburn often for more than 2 weeks. Get help right away if:  You have pain in your arms, neck, jaw, teeth, or back all of a sudden.  You feel sweaty, dizzy, or light-headed all of a sudden.  You have chest pain or shortness of  breath.  You vomit and your vomit looks like blood or coffee grounds.  Your poop (stool) is bloody or black. These symptoms may be an emergency. Get help right away. Call your local emergency services (911 in the [place]  Do not wait to see if the symptoms will go away.  Do not drive yourself to the hospital. Summary  Heartburn is a type of pain that can happen in your throat or chest. It can feel like a burning pain. It may also cause a bad, acid-like taste in your mouth.  You may need to avoid certain foods and drinks to help your symptoms. Ask your doctor what foods and drinks you should avoid.  Take over-the-counter and prescription medicines only as told by your doctor. Do not take aspirin or NSAIDs, such as ibuprofen, unless your doctor told you to do so.  Contact your doctor if your symptoms  do not get better or they get worse. This information is not intended to replace advice given to you by your health care provider. Make sure you discuss any questions you have with your health care provider. Document Revised: 08/09/2019 Document Reviewed: 08/09/2019 Elsevier Patient Education  [place].

## 2020-07-18 NOTE — Progress Notes (Signed)
Follow Up Note  RE: Dean Thompson MRN: [id number] DOB: [birth year] Date of Office Visit: 07/18/2020  Referring provider: [name], NP Primary care provider: [name], NP  Chief Complaint: Pruritis (Last few weeks non stop itching on legs and ankles )  History of Present Illness: I had the pleasure of seeing Dean Thompson for a follow up visit at the [facility] on 07/18/2020. He is a 70 y.o. male, who is being followed for pruritic rash, coughing, nonallergic rhinitis, hymenoptera allergy. His previous allergy office visit was on 05/16/2020 with Dr. Maudie Mercury. Today is a new complaint visit of rash/itching.  Itching  Noted some itching on the legs/ankles 3 weeks ago. Denies any changes in diet, medications, personal care products. Minimal rash.  Currently using hydroxyzine 10mg  1-2 at night with minimal benefit.  Patient ran out of mometasone ointment which helped the most.  Using some moisturizer creams with aloe.  No increased time outdoors.   Coughing Started omeprazole 20mg  daily in the morning and noticed improvement in the coughing.  Chronic rhinitis Not using any nasal sprays.  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   Assessment and Plan: [name] is a 70 y.o. male with: Pruritic rash Past history - Pruritic rash which started 6 weeks ago.  Unknown trigger.  Took hydroxyzine, Diflucan and prednisone.  Rash has significantly improved.  Denies any changes in diet, medication or personal care products. 2021 skin testing showed: Negative to indoor/outdoor allergens and select foods. Interim history - new itching/rash started 3 weeks ago.   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  Start zyrtec 10mg  daily to help with itching.   Use mometasone 0.1% cream once a day as needed for rash. Do not use on the face, neck, armpits or groin area. Do not use more than 3 weeks in a row.   Moisturize daily using  the triamcinolone:Eucerin mix on the legs.  If it flares again, may need to see dermatology for further evaluation.  Take pictures.   See below for proper skin care.   Take hydroxyzine 10mg -20mg  at night as needed.   Heartburn Coughing resolved with PPI.  Continue lifestyle and dietary modifications.  Continue  omeprazole 20mg  daily in the morning. Nothing to eat or drink for 30 minutes afterwards.   Chronic rhinitis Past history - Some rhinitis symptoms during seasonal changes. 2021 skin testing showed: Negative to indoor/outdoor allergens. Interim history - Asymptomatic with no meds.   May use Flonase (fluticasone) nasal spray 1 spray per nostril twice a day as needed for nasal congestion.   May use Afrin for short periods for nasal congestion.   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 Used to be on injections for 6 months only. No recent stings. Bloodwork negative to hymenoptera panel.  For mild symptoms you can take over the counter antihistamines such as Benadryl and monitor symptoms closely. If symptoms worsen or if you have severe symptoms including breathing issues, throat closure, significant swelling, whole body hives, severe diarrhea and vomiting, lightheadedness then inject epinephrine and seek immediate medical care afterwards.  Emergency action plan in place.   Consider venom allergy skin testing in the future - this is done in our [facility] office every other month.  Return in about 3 months (around 10/18/2020).  Meds ordered this encounter  Medications  . mometasone (ELOCON) 0.1 % cream    Sig: Apply 1 application topically daily  as needed (rash). Do not use on the face, neck, armpits or groin area. Do not use more than 3 weeks in a row.    Dispense:  45 g    Refill:  2  . DISCONTD: triamcinolone ointment (KENALOG) 0.1 %    Sig: Apply 1 application topically daily. Use once a day on the leg as moisturizer.     Dispense:  453 g    Refill:  2  . cetirizine (ZYRTEC ALLERGY) 10 MG tablet    Sig: Take 1 tablet (10 mg total) by mouth daily.    Dispense:  30 tablet    Refill:  5  . triamcinolone ointment (KENALOG) 0.1 %    Sig: Apply 1 application topically daily. Use as moisturizer on the leg    Dispense:  453 g    Refill:  2    Please mix 1:1 with Eucerin. Please cancel prior Rx for plain triamcinolone ointment. Thank you.   Lab Orders  No laboratory test(s) ordered today    Diagnostics: None.  Medication List:  Current Outpatient Medications  Medication Sig Dispense Refill  . albuterol (VENTOLIN HFA) 108 (90 Base) MCG/ACT inhaler Inhale 2 puffs into the lungs every 4 (four) hours as needed for wheezing or shortness of breath (coughing fits). 8 g 2  . cetirizine (ZYRTEC ALLERGY) 10 MG tablet Take 1 tablet (10 mg total) by mouth daily. 30 tablet 5  . EPINEPHrine 0.3 mg/0.3 mL IJ SOAJ injection Inject 0.3 mLs (0.3 mg total) into the muscle as needed for anaphylaxis. 2 each 1  . hydrOXYzine (ATARAX/VISTARIL) 10 MG tablet Take 1 tablet (10 mg total) by mouth at bedtime as needed for itching. 30 tablet 2  . mometasone (ELOCON) 0.1 % cream Apply 1 application topically daily as needed (rash). Do not use on the face, neck, armpits or groin area. Do not use more than 3 weeks in a row. 45 g 2  . Multiple Vitamin (MULTIVITAMIN WITH MINERALS) TABS Take 1 tablet by mouth every morning.    Marland Kitchen omeprazole (PRILOSEC) 20 MG capsule Take 1 capsule (20 mg total) by mouth daily. 30 capsule 2  . triamcinolone ointment (KENALOG) 0.1 % Apply 1 application topically daily. Use as moisturizer on the leg 453 g 2   Current Facility-Administered Medications  Medication Dose Route Frequency Provider Last Rate Last Admin  . sulfamethoxazole-trimethoprim (BACTRIM DS) 800-160 MG per tablet 1 tablet  1 tablet Oral Q12H Daryll Brod, MD       Allergies: Allergies  Allergen Reactions  . Wasp Venom Anaphylaxis  . Citrus Other  (See Comments)    "nasal congestion" "nasal congestion"  . Other     Spicy foods and tomato based foods cause a cough, uses Loratadine with relief   I reviewed his past medical history, social history, family history, and environmental history and no significant changes have been reported from his previous visit.  Review of Systems  Constitutional: Negative for appetite change, chills, fever and unexpected weight change.  HENT: Negative for congestion and rhinorrhea.   Eyes: Negative for itching.  Respiratory: Negative for cough, chest tightness, shortness of breath and wheezing.   Cardiovascular: Negative for chest pain.  Gastrointestinal: Negative for abdominal pain.  Genitourinary: Negative for difficulty urinating.  Skin: Positive for rash.  Allergic/Immunologic: Negative for environmental allergies.  Neurological: Negative for headaches.   Objective: BP 124/72 (BP Location: Left Arm, Patient Position: Sitting, Cuff Size: Normal)   Pulse 66   Temp (!) 97.1 F (  36.2 C) (Temporal)   Resp 16   SpO2 95%  There is no height or weight on file to calculate BMI. Physical Exam Vitals and nursing note reviewed.  Constitutional:      Appearance: Normal appearance. He is well-developed.  HENT:     Head: Normocephalic and atraumatic.     Right Ear: Tympanic membrane and external ear normal.     Left Ear: Tympanic membrane and external ear normal.     Nose: Nose normal.     Mouth/Throat:     Mouth: Mucous membranes are moist.     Pharynx: Oropharynx is clear.  Eyes:     Conjunctiva/sclera: Conjunctivae normal.  Cardiovascular:     Rate and Rhythm: Normal rate and regular rhythm.     Heart sounds: Normal heart sounds. No murmur heard. No friction rub. No gallop.   Pulmonary:     Effort: Pulmonary effort is normal.     Breath sounds: Normal breath sounds. No wheezing, rhonchi or rales.  Musculoskeletal:     Cervical back: Neck supple.  Skin:    General: Skin is warm and dry.      Findings: Rash present.     Comments: Below the knee - dry skin throughout with some mild discoloration and raised bumps.  Neurological:     Mental Status: He is alert and oriented to person, place, and time.  Psychiatric:        Behavior: Behavior normal.    Previous notes and tests were reviewed. The plan was reviewed with the patient/family, and all questions/concerned were addressed.  It was my pleasure to see Dean Thompson today and participate in his care. Please feel free to contact me with any questions or concerns.  Sincerely,  Rexene Alberts, DO Allergy & Immunology  [facility] office: [place] office: [phone]

## 2020-07-24 ENCOUNTER — Other Ambulatory Visit: Payer: Self-pay

## 2020-07-24 ENCOUNTER — Telehealth: Payer: Self-pay

## 2020-07-24 NOTE — Telephone Encounter (Signed)
Pa submitted thru cover my meds for triamcinolone 0.1% ointment

## 2020-08-08 ENCOUNTER — Telehealth: Payer: Self-pay | Admitting: Allergy

## 2020-08-08 NOTE — Telephone Encounter (Signed)
Please send referral to dermatology for pruritic rash - may need biopsy.   Thank you.

## 2020-08-08 NOTE — Telephone Encounter (Signed)
Referral, demographics, and notes faxed to [facility] at [phone] to be scheduled with Dr. Anabel Bene. Informed patient of all information and that their practice will reach out to him to schedule an appointment. Patient verbalized understanding.

## 2020-08-08 NOTE — Telephone Encounter (Signed)
Patient called stating he was told if treatment was not working for him, Dr. Maudie Mercury would send a referral to a dermatologist. Patient states he believes he needs to be sent to a dermatologist as treatment has not been helping.   Please advise  Best contact number: [phone]

## 2020-08-13 ENCOUNTER — Ambulatory Visit: Payer: Medicare Other | Admitting: Allergy

## 2020-08-15 ENCOUNTER — Ambulatory Visit: Payer: Medicare Other | Admitting: Allergy

## 2020-08-22 DIAGNOSIS — L249 Irritant contact dermatitis, unspecified cause: Secondary | ICD-10-CM | POA: Diagnosis not present

## 2020-08-30 DIAGNOSIS — Z4789 Encounter for other orthopedic aftercare: Secondary | ICD-10-CM | POA: Diagnosis not present

## 2020-08-30 DIAGNOSIS — M71341 Other bursal cyst, right hand: Secondary | ICD-10-CM | POA: Diagnosis not present

## 2020-08-30 NOTE — Telephone Encounter (Signed)
Patient seen on 08/22/2020 @815am . Office will fax over office notes.

## 2020-09-02 ENCOUNTER — Encounter: Payer: Self-pay | Admitting: Allergy

## 2020-09-02 NOTE — Progress Notes (Signed)
Reviewed notes from [facility] dermatology 08/22/2020 Dx: irritant contact dermatitis.

## 2020-09-17 ENCOUNTER — Encounter: Payer: Self-pay | Admitting: Adult Health

## 2020-09-17 ENCOUNTER — Ambulatory Visit (INDEPENDENT_AMBULATORY_CARE_PROVIDER_SITE_OTHER): Payer: Medicare Other | Admitting: Adult Health

## 2020-09-17 ENCOUNTER — Other Ambulatory Visit: Payer: Self-pay

## 2020-09-17 VITALS — BP 120/80 | HR 77 | Temp 98.6°F | Ht 70.0 in | Wt 201.0 lb

## 2020-09-17 DIAGNOSIS — R600 Localized edema: Secondary | ICD-10-CM | POA: Diagnosis not present

## 2020-09-17 DIAGNOSIS — D492 Neoplasm of unspecified behavior of bone, soft tissue, and skin: Secondary | ICD-10-CM

## 2020-09-17 DIAGNOSIS — L820 Inflamed seborrheic keratosis: Secondary | ICD-10-CM | POA: Diagnosis not present

## 2020-09-17 NOTE — Progress Notes (Signed)
Subjective:    Patient ID: Dean Thompson, male    DOB: 1950-06-24, 70 y.o.   MRN: [id number]  HPI 70 year old male who  has a past medical history of Allergy, Hyperlipidemia, and Rotator cuff tear.  He presents to the office today for two separate issues.   Swelling of right leg x 2 months. Swelling is located in the calf. Denies pain, redness, or warmth. Swelling does not interfere with ROM. Denies trauma or aggravating injury. No chest pain or SOB.   2.  Skin neoplasm - reports a " mole" that has grown rapidly on his right shoulder. Has become larger over the last week or so.  Review of Systems See HPI   Past Medical History:  Diagnosis Date   Allergy    Hyperlipidemia    Rotator cuff tear    left    Social History   Socioeconomic History   Marital status: Divorced    Spouse name: Not on file   Number of children: Not on file   Years of education: Not on file   Highest education level: Not on file  Occupational History   Not on file  Tobacco Use   Smoking status: Never   Smokeless tobacco: Never  Vaping Use   Vaping Use: Never used  Substance and Sexual Activity   Alcohol use: Not Currently   Drug use: Never   Sexual activity: Not on file  Other Topics Concern   Not on file  Social History Narrative   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       Social Determinants of Health   Financial Resource Strain: Low Risk    Difficulty of Paying Living Expenses: Not hard at all  Food Insecurity: No Food Insecurity   Worried About Charity fundraiser in the Last Year: Never true   [place] in the Last Year: Never true  Transportation Needs: No Transportation Needs   Lack of Transportation (Medical): No   Lack of Transportation (Non-Medical): No  Physical Activity: Sufficiently Active   Days of Exercise per Week: 3 days   Minutes of Exercise per Session: 60 min   Stress: No Stress Concern Present   Feeling of Stress : Not at all  Social Connections: Moderately Integrated   Frequency of Communication with Friends and Family: More than three times a week   Frequency of Social Gatherings with Friends and Family: More than three times a week   Attends Religious Services: More than 4 times per year   Active Member of [employer] or Organizations: No   Attends Archivist Meetings: Never   Marital Status: Married  Human resources officer Violence: Not At Risk   Fear of Current or Ex-Partner: No   Emotionally Abused: No   Physically Abused: No   Sexually Abused: No    Past Surgical History:  Procedure Laterality Date   COLONOSCOPY  2004   in [place] and was normal per pt   CYSTECTOMY  2006   subacious cyst  remove from neck   EXCISION METACARPAL MASS Right 05/21/2020   Procedure: EXCISION MUCOID CYST WITH DISTAL INTERPHALANGEAL JOINT ARTHROTOMY RIGHT MIDDLE FINGER;  Surgeon: Daryll Brod, MD;  Location: [facility];  Service: Orthopedics;  Laterality: Right;   ROTATOR CUFF REPAIR Right 1999   SEPTOPLASTY  [place] REPAIR  10/01/2011   Procedure: ROTATOR CUFF REPAIR SHOULDER OPEN;  Surgeon: Magnus Sinning, MD;  Location: [facility];  Service: Orthopedics;  Laterality: Left;  Left Shoulder Acrominectomy/Open Rotator Cuff Repair/Distal Clavicle Resection   SPINE SURGERY  2018    Family History  Adopted: Yes  Problem Relation Age of Onset   Heart disease Father    Colon cancer Neg Hx    Colon polyps Neg Hx    Rectal cancer Neg Hx    Stomach cancer Neg Hx    Asthma Neg Hx    Allergic rhinitis Neg Hx    Immunodeficiency Neg Hx    Eczema Neg Hx    Urticaria Neg Hx    Angioedema Neg Hx     Allergies  Allergen Reactions   Wasp Venom Anaphylaxis   Citrus Other (See Comments)    "nasal congestion" "nasal congestion"   Other     Spicy foods and tomato based foods cause a cough, uses Loratadine with relief     Current Outpatient Medications on File Prior to Visit  Medication Sig Dispense Refill   albuterol (VENTOLIN HFA) 108 (90 Base) MCG/ACT inhaler Inhale 2 puffs into the lungs every 4 (four) hours as needed for wheezing or shortness of breath (coughing fits). 8 g 2   cetirizine (ZYRTEC ALLERGY) 10 MG tablet Take 1 tablet (10 mg total) by mouth daily. 30 tablet 5   EPINEPHrine 0.3 mg/0.3 mL IJ SOAJ injection Inject 0.3 mLs (0.3 mg total) into the muscle as needed for anaphylaxis. 2 each 1   hydrOXYzine (ATARAX/VISTARIL) 10 MG tablet Take 1 tablet (10 mg total) by mouth at bedtime as needed for itching. 30 tablet 2   mometasone (ELOCON) 0.1 % cream Apply 1 application topically daily as needed (rash). Do not use on the face, neck, armpits or groin area. Do not use more than 3 weeks in a row. 45 g 2   Multiple Vitamin (MULTIVITAMIN WITH MINERALS) TABS Take 1 tablet by mouth every morning.     omeprazole (PRILOSEC) 20 MG capsule Take 1 capsule (20 mg total) by mouth daily. 30 capsule 2   triamcinolone ointment (KENALOG) 0.1 % Apply 1 application topically daily. Use as moisturizer on the leg 453 g 2   triamcinolone ointment (KENALOG) 0.1 % Apply 1 application topically 2 (two) times daily. 453.6 g 5   Current Facility-Administered Medications on File Prior to Visit  Medication Dose Route Frequency Provider Last Rate Last Admin   sulfamethoxazole-trimethoprim (BACTRIM DS) 800-160 MG per tablet 1 tablet  1 tablet Oral Q12H Daryll Brod, MD        BP 120/80   Pulse 77   Temp 98.6 F (37 C) (Oral)   Ht '5\' 10"'$  (1.778 m)   Wt 201 lb (91.2 kg)   SpO2 95%   BMI 28.84 kg/m       Objective:   Physical Exam Vitals and nursing note reviewed.  Constitutional:      Appearance: Normal appearance.  Cardiovascular:     Rate and Rhythm: Normal rate and regular rhythm.     Pulses: Normal pulses.     Heart sounds: Normal heart sounds.  Pulmonary:     Effort: Pulmonary effort is normal.     Breath  sounds: Normal breath sounds.  Musculoskeletal:        General: Swelling present. No tenderness. Normal range of motion.     Right lower leg: Edema (+ 1  pitting edema from shin to ankle) present.     Comments: Significant swelling to right calf compared to left. No redness, warmth, or tenderness noted.   Skin:    General: Skin is warm and dry.     Comments: 0.6 cm Skin neoplasm on right shoulder. 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     Mental Status: He is alert and oriented to person, place, and time.  Psychiatric:        Mood and Affect: Mood normal.        Behavior: Behavior normal.        Thought Content: Thought content normal.        Judgment: Judgment normal.      Assessment & Plan:  1. Edema of right lower extremity - r/o DVT - VAS [place] LOWER EXTREMITY VENOUS (DVT); Future  2. Skin neoplasn Procedure including risks/benefits explained to patient.  Questions were answered. After informed consent was obtained and a time out completed, site was cleansed with betadine and then alcohol. 1% Lidocaine without epinephrine was injected under lesion and then shave biopsy was performed. Area was cauterized to obtain hemostasis.  Pt tolerated procedure well.  Specimen sent for pathology review.  Pt instructed to keep the area dry for 24 hours and to contact us if he develops redness, drainage or swelling at the site.  Pt may use tylenol as needed for discomfort today.   - Dermatology pathology  [name], NP

## 2020-09-18 ENCOUNTER — Telehealth: Payer: Self-pay

## 2020-09-18 ENCOUNTER — Ambulatory Visit (HOSPITAL_COMMUNITY)
Admission: RE | Admit: 2020-09-18 | Discharge: 2020-09-18 | Disposition: A | Discharge status: Home / Self Care | Payer: Medicare Other | Source: Ambulatory Visit | Attending: Adult Health | Admitting: Adult Health

## 2020-09-18 DIAGNOSIS — M7121 Synovial cyst of popliteal space [Baker], right knee: Secondary | ICD-10-CM

## 2020-09-18 DIAGNOSIS — R600 Localized edema: Secondary | ICD-10-CM | POA: Diagnosis not present

## 2020-09-18 NOTE — Progress Notes (Signed)
Right lower extremity venous duplex completed. Refer to "CV Proc" under chart review to view preliminary results.  09/18/2020 10:41 AM Kelby Aline., MHA, RVT, RDCS, RDMS

## 2020-09-18 NOTE — Telephone Encounter (Signed)
Call from representative stating  Surgical Pathology Report  Negitive DVT  Bakers Cyst on Right

## 2020-09-18 NOTE — Telephone Encounter (Signed)
FYI

## 2020-09-18 NOTE — Telephone Encounter (Signed)
Noted  

## 2020-09-23 ENCOUNTER — Other Ambulatory Visit: Payer: Self-pay

## 2020-09-23 ENCOUNTER — Ambulatory Visit (INDEPENDENT_AMBULATORY_CARE_PROVIDER_SITE_OTHER): Payer: Medicare Other | Admitting: Family Medicine

## 2020-09-23 ENCOUNTER — Encounter: Payer: Self-pay | Admitting: Family Medicine

## 2020-09-23 VITALS — BP 126/60 | HR 60 | Temp 98.1°F | Wt 199.9 lb

## 2020-09-23 DIAGNOSIS — R252 Cramp and spasm: Secondary | ICD-10-CM | POA: Diagnosis not present

## 2020-09-23 DIAGNOSIS — M7989 Other specified soft tissue disorders: Secondary | ICD-10-CM | POA: Diagnosis not present

## 2020-09-23 LAB — MAGNESIUM: Magnesium: 2 mg/dL (ref 1.5–2.5)

## 2020-09-23 LAB — BASIC METABOLIC PANEL
BUN: 18 mg/dL (ref 6–23)
CO2: 25 mEq/L (ref 19–32)
Calcium: 9.3 mg/dL (ref 8.4–10.5)
Chloride: 107 mEq/L (ref 96–112)
Creatinine, Ser: 1.17 mg/dL (ref 0.40–1.50)
GFR: 63.24 mL/min (ref >60.00)
Glucose, Bld: 107 mg/dL — ABNORMAL HIGH (ref 70–99)
Potassium: 4.4 mEq/L (ref 3.5–5.1)
Sodium: 141 mEq/L (ref 135–145)

## 2020-09-23 NOTE — Progress Notes (Signed)
Established Patient Office Visit  Subjective:  Patient ID: Dean Thompson, male    DOB: 03-25-1950  Age: 70 y.o. MRN: [id number]  CC:  Chief Complaint  Patient presents with   Leg Swelling    R calf, x 6 weeks, saw Tommi Rumps last week, swelling has gone down slightly, not usually painful but does cramp during the night    HPI Dean Thompson presents for right calf swelling without significant pain for several weeks.  He has known varicose disease left lower extremity.  He recently saw primary and had venous Doppler on the third of this month which showed no evidence for DVT.  There was comment of "a cystic structure is found in the popliteal fossa".  No pain with ambulation.  He does state that he started having some muscle cramps predominantly in his right calf last Thursday and then again Saturday night.  He feels he is hydrating well.  Does not spend a lot of time outdoors or perspiring.  No diuretic use.  No generalized muscle cramps.  No claudication symptoms. No significant knee pain.  No prior history of DVT.  Past Medical History:  Diagnosis Date   Allergy    Hyperlipidemia    Rotator cuff tear    left    Past Surgical History:  Procedure Laterality Date   COLONOSCOPY  2004   in [place] and was normal per pt   CYSTECTOMY  2006   subacious cyst  remove from neck   EXCISION METACARPAL MASS Right 05/21/2020   Procedure: EXCISION MUCOID CYST WITH DISTAL INTERPHALANGEAL JOINT ARTHROTOMY RIGHT MIDDLE FINGER;  Surgeon: Daryll Brod, MD;  Location: [facility];  Service: Orthopedics;  Laterality: Right;   ROTATOR CUFF REPAIR Right 1999   SEPTOPLASTY  1982   SHOULDER OPEN ROTATOR CUFF REPAIR  10/01/2011   Procedure: ROTATOR CUFF REPAIR SHOULDER OPEN;  Surgeon: Magnus Sinning, MD;  Location: [facility];  Service: Orthopedics;  Laterality: Left;  Left Shoulder Acrominectomy/Open Rotator Cuff Repair/Distal Clavicle Resection   SPINE SURGERY  2018    Family History   Adopted: Yes  Problem Relation Age of Onset   Heart disease Father    Colon cancer Neg Hx    Colon polyps Neg Hx    Rectal cancer Neg Hx    Stomach cancer Neg Hx    Asthma Neg Hx    Allergic rhinitis Neg Hx    Immunodeficiency Neg Hx    Eczema Neg Hx    Urticaria Neg Hx    Angioedema Neg Hx     Social History   Socioeconomic History   Marital status: Married    Spouse name: Not on file   Number of children: Not on file   Years of education: Not on file   Highest education level: Not on file  Occupational History   Not on file  Tobacco Use   Smoking status: Never   Smokeless tobacco: Never  Vaping Use   Vaping Use: Never used  Substance and Sexual Activity   Alcohol use: Not Currently   Drug use: Never   Sexual activity: Not on file  Other Topics Concern   Not on file  Social History Narrative   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       Social Determinants of Health   Financial  Resource Strain: Low Risk    Difficulty of Paying Living Expenses: Not hard at all  Food Insecurity: No Food Insecurity   Worried About Charity fundraiser in the Last Year: Never true   Ran Out of Food in the Last Year: Never true  Transportation Needs: No Transportation Needs   Lack of Transportation (Medical): No   Lack of Transportation (Non-Medical): No  Physical Activity: Sufficiently Active   Days of Exercise per Week: 3 days   Minutes of Exercise per Session: 60 min  Stress: No Stress Concern Present   Feeling of Stress : Not at all  Social Connections: Moderately Integrated   Frequency of Communication with Friends and Family: More than three times a week   Frequency of Social Gatherings with Friends and Family: More than three times a week   Attends Religious Services: More than 4 times per year   Active Member of [employer] or Organizations: No   Attends Archivist  Meetings: Never   Marital Status: Married  Human resources officer Violence: Not At Risk   Fear of Current or Ex-Partner: No   Emotionally Abused: No   Physically Abused: No   Sexually Abused: No    Outpatient Medications Prior to Visit  Medication Sig Dispense Refill   albuterol (VENTOLIN HFA) 108 (90 Base) MCG/ACT inhaler Inhale 2 puffs into the lungs every 4 (four) hours as needed for wheezing or shortness of breath (coughing fits). 8 g 2   cetirizine (ZYRTEC ALLERGY) 10 MG tablet Take 1 tablet (10 mg total) by mouth daily. 30 tablet 5   EPINEPHrine 0.3 mg/0.3 mL IJ SOAJ injection Inject 0.3 mLs (0.3 mg total) into the muscle as needed for anaphylaxis. 2 each 1   hydrOXYzine (ATARAX/VISTARIL) 10 MG tablet Take 1 tablet (10 mg total) by mouth at bedtime as needed for itching. 30 tablet 2   mometasone (ELOCON) 0.1 % cream Apply 1 application topically daily as needed (rash). Do not use on the face, neck, armpits or groin area. Do not use more than 3 weeks in a row. 45 g 2   Multiple Vitamin (MULTIVITAMIN WITH MINERALS) TABS Take 1 tablet by mouth every morning.     omeprazole (PRILOSEC) 20 MG capsule Take 1 capsule (20 mg total) by mouth daily. 30 capsule 2   triamcinolone ointment (KENALOG) 0.1 % Apply 1 application topically daily. Use as moisturizer on the leg 453 g 2   triamcinolone ointment (KENALOG) 0.1 % Apply 1 application topically 2 (two) times daily. 453.6 g 5   sulfamethoxazole-trimethoprim (BACTRIM DS) 800-160 MG per tablet 1 tablet      No facility-administered medications prior to visit.    Allergies  Allergen Reactions   Wasp Venom Anaphylaxis   Citrus Other (See Comments)    "nasal congestion" "nasal congestion"   Other     Spicy foods and tomato based foods cause a cough, uses Loratadine with relief    ROS Review of Systems  Constitutional:  Negative for chills and fever.  Respiratory:  Negative for shortness of breath.   Cardiovascular:  Positive for leg swelling.  Negative for chest pain.     Objective:    Physical Exam Vitals reviewed.  Constitutional:      Appearance: Normal appearance.  Cardiovascular:     Rate and Rhythm: Normal rate and regular rhythm.  Pulmonary:     Effort: Pulmonary effort is normal.     Breath sounds: Normal breath sounds.  Musculoskeletal:  Comments: He has some significant varicosities left calf.  Right calf does appear to be slightly larger than the left.  No localized tenderness.  Does have some very slight fullness in the right popliteal space but nontender.  No knee effusion.  Skin:    Comments: Good capillary refill in both feet  Neurological:     Mental Status: He is alert.    BP 126/60 (BP Location: Left Arm, Patient Position: Sitting, Cuff Size: Normal)   Pulse 60   Temp 98.1 F (36.7 C) (Oral)   Wt 199 lb 14.4 oz (90.7 kg)   SpO2 98%   BMI 28.68 kg/m  Wt Readings from Last 3 Encounters:  09/23/20 199 lb 14.4 oz (90.7 kg)  09/17/20 201 lb (91.2 kg)  05/21/20 202 lb 13.2 oz (92 kg)     Health Maintenance Due  Topic Date Due   Zoster Vaccines- Shingrix (1 of 2) Never done   COVID-19 Vaccine (3 - Moderna risk series) 08/13/2019   INFLUENZA VACCINE  09/16/2020    There are no preventive care reminders to display for this patient.  Lab Results  Component Value Date   TSH 1.86 10/17/2019   Lab Results  Component Value Date   WBC 5.4 10/17/2019   HGB 16.3 10/17/2019   HCT 47.5 10/17/2019   MCV 92.6 10/17/2019   PLT 136 (L) 10/17/2019   Lab Results  Component Value Date   NA 142 10/17/2019   K 4.6 10/17/2019   CO2 26 10/17/2019   GLUCOSE 97 10/17/2019   BUN 19 10/17/2019   CREATININE 1.25 10/17/2019   BILITOT 1.3 (H) 10/17/2019   ALKPHOS 95 10/13/2018   AST 17 10/17/2019   ALT 20 10/17/2019   PROT 6.5 10/17/2019   ALBUMIN 4.6 10/13/2018   CALCIUM 9.5 10/17/2019   ANIONGAP 7 11/24/2016   GFR 57.37 (L) 10/13/2018   Lab Results  Component Value Date   CHOL 171 10/17/2019    Lab Results  Component Value Date   HDL 35 (L) 10/17/2019   Lab Results  Component Value Date   LDLCALC 111 (H) 10/17/2019   Lab Results  Component Value Date   TRIG 132 10/17/2019   Lab Results  Component Value Date   CHOLHDL 4.9 10/17/2019   Lab Results  Component Value Date   HGBA1C 5.1 10/09/2015      Assessment & Plan:   #1 recent swelling right calf.  No evidence for DVT.  Apparently did have cystic structure noted popliteal region which could be contributing.  We discussed Baker's cyst these are generally benign and nonspecific -Reassurance and observation for now -No reason to limit his activities as long as there is no associated pain  #2 intermittent muscle cramps past few days.  No diuretic therapy.  -Check basic metabolic panel and magnesium level -Stay well-hydrated   No orders of the defined types were placed in this encounter.   Follow-up: No follow-ups on file.    Carolann Littler, MD

## 2020-09-24 ENCOUNTER — Ambulatory Visit: Payer: Medicare Other | Admitting: Adult Health

## 2020-09-25 NOTE — Telephone Encounter (Signed)
PT called to return the [place] Phone call msg

## 2020-09-25 NOTE — Addendum Note (Signed)
Addended by: Gwenyth Ober R on: 09/25/2020 03:18 PM   Modules accepted: Orders

## 2020-09-25 NOTE — Telephone Encounter (Signed)
Left message to return phone call.

## 2020-09-25 NOTE — Telephone Encounter (Signed)
Referral placed.

## 2020-10-02 ENCOUNTER — Encounter: Payer: Self-pay | Admitting: Orthopaedic Surgery

## 2020-10-02 ENCOUNTER — Other Ambulatory Visit: Payer: Self-pay

## 2020-10-02 ENCOUNTER — Ambulatory Visit (INDEPENDENT_AMBULATORY_CARE_PROVIDER_SITE_OTHER): Payer: Medicare Other | Admitting: Orthopaedic Surgery

## 2020-10-02 ENCOUNTER — Ambulatory Visit (INDEPENDENT_AMBULATORY_CARE_PROVIDER_SITE_OTHER): Payer: Medicare Other

## 2020-10-02 DIAGNOSIS — M25561 Pain in right knee: Secondary | ICD-10-CM | POA: Diagnosis not present

## 2020-10-02 DIAGNOSIS — R2241 Localized swelling, mass and lump, right lower limb: Secondary | ICD-10-CM | POA: Diagnosis not present

## 2020-10-02 DIAGNOSIS — G8929 Other chronic pain: Secondary | ICD-10-CM

## 2020-10-02 NOTE — Progress Notes (Signed)
Office Visit Note   Patient: Dean Thompson           Date of Birth: August 13, 1950           MRN: [id number] Visit Date: 10/02/2020              Requested by: [name], NP [place],  [place] [address] PCP: [name], NP   Assessment & Plan: Visit Diagnoses:  1. Chronic pain of right knee   2. Localized swelling of right lower extremity     Plan: Dean Thompson relates that he has had some swelling in the area of his right calf for at least 6 months.  He was recently seen by his primary care physician.  A Doppler study was negative for DVT but they did notice a small popliteal Baker's cyst.  He relates he has not had any pain or discomfort or swelling in his knee and does not have any significant pain in his calf ankle or foot.  I could not palpate a Baker's cyst today and he did not have any localized areas of knee discomfort.  There was no effusion.  He had full extension of flexion way over 105 degrees without instability.  He does have multiple varicose veins in his left calf and has had prior vein surgery and has a few on the right.  This might be a possible cause of his swelling.  I suggested he might want to check with the vascular team for any further suggestions but from an orthopedic standpoint he is totally asymptomatic.  There is always a possibility that he has a little bit of a leak in the popliteal cyst but it was not palpable today.  If that were the case then he would need any specific treatment as he is not uncomfortable.  The other option would be an MRI scan of his calf or knee but would suggest a vascular evaluation first  Follow-Up Instructions: Return if symptoms worsen or fail to improve.   Orders:  Orders Placed This Encounter  Procedures   XR KNEE 3 VIEW RIGHT   No orders of the defined types were placed in this encounter.     Procedures: No procedures performed   Clinical Data: No additional findings.   Subjective: Chief  Complaint  Patient presents with   Right Knee - Pain  Dean Thompson has noticed swelling in his right calf for about 6 months.  He denies any pain.  He was recently seen by his primary care physician and a Doppler study was performed that was negative for DVT.  They did see a popliteal cyst.  Dean Thompson not been able to palpate the cyst and notes he is not having any pain but just concerned about the Swelling.  His past history is significant that he has had vein stripping on the left for varicose veins.  He has had some very mild swelling of his calf and right ankle but no pain numbness or tingling.  HPI  Review of Systems   Objective: Vital Signs: There were no vitals taken for this visit.  Physical Exam Constitutional:      Appearance: He is well-developed.  Eyes:     Pupils: Pupils are equal, round, and reactive to light.  Pulmonary:     Effort: Pulmonary effort is normal.  Skin:    General: Skin is warm and dry.  Neurological:     Mental Status: He is alert and oriented to person,  place, and time.  Psychiatric:        Behavior: Behavior normal.    Ortho Exam right knee was not hot red warm or swollen.  No effusion.  No localized areas of tenderness.  There is prominence of the tibial tubercle consistent with old Osgood-Schlatter's.  I could not palpate a popliteal cyst.  There is no popliteal pain.  He had some mild swelling of his right calf but no pain.  Negative Homan.  Very minimal nonpitting edema of his right ankle.  Good capillary refill to the toes.  Good pulses and normal sensation.  Motor exam intact.  Specialty Comments:  No specialty comments available.  Imaging: XR KNEE 3 VIEW RIGHT  Result Date: 10/02/2020 Films of the right knee obtained in 3 projections standing.  Patient has had a recent Doppler study with negative DVT but with a positive popliteal cyst.  There were minimal degenerative changes on x-ray.  The joint spaces are well-maintained.  No ectopic  calcification or acute changes.  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   Chronic rhinitis 02/13/2020   Past Medical History:  Diagnosis Date   Allergy    Hyperlipidemia    Rotator cuff tear    left    Family History  Adopted: Yes  Problem Relation Age of Onset   Heart disease Father    Colon cancer Neg Hx    Colon polyps Neg Hx    Rectal cancer Neg Hx    Stomach cancer Neg Hx    Asthma Neg Hx    Allergic rhinitis Neg Hx    Immunodeficiency Neg Hx    Eczema Neg Hx    Urticaria Neg Hx    Angioedema Neg Hx     Past Surgical History:  Procedure Laterality Date   COLONOSCOPY  2004   in [place] and was normal per pt   CYSTECTOMY  2006   subacious cyst  remove from neck   EXCISION METACARPAL MASS Right 05/21/2020   Procedure: EXCISION MUCOID CYST WITH DISTAL INTERPHALANGEAL JOINT ARTHROTOMY RIGHT MIDDLE FINGER;  Surgeon: Daryll Brod, MD;  Location: [facility];  Service: Orthopedics;  Laterality: Right;   ROTATOR CUFF REPAIR Right 1999   SEPTOPLASTY  1982   SHOULDER OPEN ROTATOR CUFF REPAIR  10/01/2011   Procedure: ROTATOR CUFF REPAIR SHOULDER OPEN;  Surgeon: Magnus Sinning, MD;  Location: [facility];  Service: Orthopedics;  Laterality: Left;  Left Shoulder Acrominectomy/Open Rotator Cuff Repair/Distal Clavicle Resection   SPINE SURGERY  2018   Social History   Occupational History   Not on file  Tobacco Use   Smoking status: Never   Smokeless tobacco: Never  Vaping Use   Vaping Use: Never used  Substance and Sexual Activity   Alcohol use: Not Currently   Drug use: Never   Sexual  activity: Not on file     Dean Balding, MD   Note - This record has been created using [employer].  Chart creation errors have been sought, but may not always  have been located. Such creation errors do not reflect on  the  standard of medical care.

## 2020-10-24 ENCOUNTER — Ambulatory Visit: Payer: Medicare Other | Admitting: Allergy

## 2020-11-05 ENCOUNTER — Other Ambulatory Visit: Payer: Self-pay

## 2020-11-06 ENCOUNTER — Encounter: Payer: Self-pay | Admitting: Adult Health

## 2020-11-06 ENCOUNTER — Ambulatory Visit (INDEPENDENT_AMBULATORY_CARE_PROVIDER_SITE_OTHER): Payer: Medicare Other | Admitting: Adult Health

## 2020-11-06 VITALS — BP 118/70 | HR 63 | Temp 98.0°F | Ht 69.0 in | Wt 198.0 lb

## 2020-11-06 DIAGNOSIS — N400 Enlarged prostate without lower urinary tract symptoms: Secondary | ICD-10-CM

## 2020-11-06 DIAGNOSIS — R12 Heartburn: Secondary | ICD-10-CM

## 2020-11-06 DIAGNOSIS — R2241 Localized swelling, mass and lump, right lower limb: Secondary | ICD-10-CM

## 2020-11-06 DIAGNOSIS — E782 Mixed hyperlipidemia: Secondary | ICD-10-CM

## 2020-11-06 LAB — LIPID PANEL
Cholesterol: 180 mg/dL (ref 0–200)
HDL: 33.3 mg/dL — ABNORMAL LOW (ref >39.00)
LDL Cholesterol: 109 mg/dL — ABNORMAL HIGH (ref 0–99)
NonHDL: 146.34
Total CHOL/HDL Ratio: 5
Triglycerides: 189 mg/dL — ABNORMAL HIGH (ref 0.0–149.0)
VLDL: 37.8 mg/dL (ref 0.0–40.0)

## 2020-11-06 LAB — COMPREHENSIVE METABOLIC PANEL
ALT: 18 U/L (ref 0–53)
AST: 19 U/L (ref 0–37)
Albumin: 4.4 g/dL (ref 3.5–5.2)
Alkaline Phosphatase: 95 U/L (ref 39–117)
BUN: 23 mg/dL (ref 6–23)
CO2: 26 mEq/L (ref 19–32)
Calcium: 9.2 mg/dL (ref 8.4–10.5)
Chloride: 106 mEq/L (ref 96–112)
Creatinine, Ser: 1.19 mg/dL (ref 0.40–1.50)
GFR: 61.92 mL/min (ref >60.00)
Glucose, Bld: 91 mg/dL (ref 70–99)
Potassium: 4.3 mEq/L (ref 3.5–5.1)
Sodium: 140 mEq/L (ref 135–145)
Total Bilirubin: 1.4 mg/dL — ABNORMAL HIGH (ref 0.2–1.2)
Total Protein: 7 g/dL (ref 6.0–8.3)

## 2020-11-06 LAB — CBC WITH DIFFERENTIAL/PLATELET
Basophils Absolute: 0 10*3/uL (ref 0.0–0.1)
Basophils Relative: 0.3 % (ref 0.0–3.0)
Eosinophils Absolute: 0.2 10*3/uL (ref 0.0–0.7)
Eosinophils Relative: 2.6 % (ref 0.0–5.0)
HCT: 46.8 % (ref 39.0–52.0)
Hemoglobin: 16 g/dL (ref 13.0–17.0)
Lymphocytes Relative: 19.7 % (ref 12.0–46.0)
Lymphs Abs: 1.2 10*3/uL (ref 0.7–4.0)
MCHC: 34.3 g/dL (ref 30.0–36.0)
MCV: 91.4 fl (ref 78.0–100.0)
Monocytes Absolute: 0.5 10*3/uL (ref 0.1–1.0)
Monocytes Relative: 7.6 % (ref 3.0–12.0)
Neutro Abs: 4.2 10*3/uL (ref 1.4–7.7)
Neutrophils Relative %: 69.8 % (ref 43.0–77.0)
Platelets: 142 10*3/uL — ABNORMAL LOW (ref 150.0–400.0)
RBC: 5.12 Mil/uL (ref 4.22–5.81)
RDW: 13.5 % (ref 11.5–15.5)
WBC: 6.1 10*3/uL (ref 4.0–10.5)

## 2020-11-06 LAB — TSH: TSH: 1.9 u[IU]/mL (ref 0.35–5.50)

## 2020-11-06 LAB — PSA: PSA: 2.48 ng/mL (ref 0.10–4.00)

## 2020-11-06 MED ORDER — OMEPRAZOLE 20 MG PO CPDR: 20.0000 mg | DELAYED_RELEASE_CAPSULE | Freq: Every day | ORAL | 1 refills | Status: DC

## 2020-11-06 NOTE — Addendum Note (Signed)
Addended by: Elmer Picker on: 11/06/2020 10:40 AM   Modules accepted: Orders

## 2020-11-06 NOTE — Progress Notes (Signed)
Subjective:    Patient ID: Dean Thompson, male    DOB: 10-Dec-1950, 70 y.o.   MRN: [id number]  HPI Patient presents for yearly preventative medicine examination. He is a pleasant 70 year old male who  has a past medical history of Allergy, Hyperlipidemia, and Rotator cuff tear.  Hyperlipidemia -not currently on any medication.  He has been working on lifestyle modifications Lab Results  Component Value Date   CHOL 171 10/17/2019   HDL 35 (L) 10/17/2019   LDLCALC 111 (H) 10/17/2019   LDLDIRECT 109.0 10/13/2018   TRIG 132 10/17/2019   CHOLHDL 4.9 10/17/2019   BPH-asymptomatic.  Not on any medication  GERD - takes Prilosec 20 PRN - takes a few times a week. Reports works well when he does take it.   Localized Swelling of right lower extremity -has had swelling in the area of his right calf for at least 6 months.  I have seen him for this issue in the past and ordered a Doppler study which was negative for DVT but did notice a small popliteal Baker's cyst.  He has been seen by orthopedics who could not palpate a Baker's cyst during the exam.  He was not having any discomfort in his knee, loss of range of motion or other abnormality, which he continues to endorse.   Orthopedics recommended following up with vascular surgery.  He would like a referral to see vascular surgery  All immunizations and health maintenance protocols were reviewed with the patient and needed orders were placed.  Appropriate screening laboratory values were ordered for the patient including screening of hyperlipidemia, renal function and hepatic function. If indicated by BPH, a PSA was ordered.  Medication reconciliation,  past medical history, social history, problem list and allergies were reviewed in detail with the patient  Goals were established with regard to weight loss, exercise, and  diet in compliance with medications. Riding bike a few times a week and eating healthy.   Wt Readings from Last 3  Encounters:  11/06/20 198 lb (89.8 kg)  09/23/20 199 lb 14.4 oz (90.7 kg)  09/17/20 201 lb (91.2 kg)   He is on the recall list for his colonoscopy   Review of Systems  Constitutional: Negative.   HENT: Negative.    Eyes: Negative.   Respiratory: Negative.    Cardiovascular:  Positive for leg swelling.  Gastrointestinal: Negative.   Endocrine: Negative.   Genitourinary: Negative.   Musculoskeletal: Negative.   Skin: Negative.   Allergic/Immunologic: Negative.   Neurological: Negative.   Hematological: Negative.   Psychiatric/Behavioral: Negative.    All other systems reviewed and are negative.   Past Medical History:  Diagnosis Date   Allergy    Hyperlipidemia    Rotator cuff tear    left    Social History   Socioeconomic History   Marital status: Married    Spouse name: Not on file   Number of children: Not on file   Years of education: Not on file   Highest education level: Not on file  Occupational History   Not on file  Tobacco Use   Smoking status: Never   Smokeless tobacco: Never  Vaping Use   Vaping Use: Never used  Substance and Sexual Activity   Alcohol use: Not Currently   Drug use: Never   Sexual activity: Not on file  Other Topics Concern   Not on file  Social History Narrative   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       Social Determinants of Health   Financial Resource Strain: Low Risk    Difficulty of Paying Living Expenses: Not hard at all  Food Insecurity: No Food Insecurity   Worried About Charity fundraiser in the Last Year: Never true   [place] in the Last Year: Never true  Transportation Needs: No Transportation Needs   Lack of Transportation (Medical): No   Lack of Transportation (Non-Medical): No  Physical Activity: Sufficiently Active   Days of Exercise per Week: 3 days   Minutes of Exercise per Session: 60 min  Stress:  No Stress Concern Present   Feeling of Stress : Not at all  Social Connections: Moderately Integrated   Frequency of Communication with Friends and Family: More than three times a week   Frequency of Social Gatherings with Friends and Family: More than three times a week   Attends Religious Services: More than 4 times per year   Active Member of [employer] or Organizations: No   Attends Archivist Meetings: Never   Marital Status: Married  Human resources officer Violence: Not At Risk   Fear of Current or Ex-Partner: No   Emotionally Abused: No   Physically Abused: No   Sexually Abused: No    Past Surgical History:  Procedure Laterality Date   COLONOSCOPY  2004   in [place] and was normal per pt   CYSTECTOMY  2006   subacious cyst  remove from neck   EXCISION METACARPAL MASS Right 05/21/2020   Procedure: EXCISION MUCOID CYST WITH DISTAL INTERPHALANGEAL JOINT ARTHROTOMY RIGHT MIDDLE FINGER;  Surgeon: Daryll Brod, MD;  Location: [facility];  Service: Orthopedics;  Laterality: Right;   ROTATOR CUFF REPAIR Right 1999   SEPTOPLASTY  1982   SHOULDER OPEN ROTATOR CUFF REPAIR  10/01/2011   Procedure: ROTATOR CUFF REPAIR SHOULDER OPEN;  Surgeon: Magnus Sinning, MD;  Location: [facility];  Service: Orthopedics;  Laterality: Left;  Left Shoulder Acrominectomy/Open Rotator Cuff Repair/Distal Clavicle Resection   SPINE SURGERY  2018    Family History  Adopted: Yes  Problem Relation Age of Onset   Heart disease Father    Colon cancer Neg Hx    Colon polyps Neg Hx    Rectal cancer Neg Hx    Stomach cancer Neg Hx    Asthma Neg Hx    Allergic rhinitis Neg Hx    Immunodeficiency Neg Hx    Eczema Neg Hx    Urticaria Neg Hx    Angioedema Neg Hx     Allergies  Allergen Reactions   Wasp Venom Anaphylaxis   Citrus Other (See Comments)    "nasal congestion" "nasal congestion"   Other     Spicy foods and tomato based foods cause a cough, uses Loratadine with relief    Current  Outpatient Medications on File Prior to Visit  Medication Sig Dispense Refill   albuterol (VENTOLIN HFA) 108 (90 Base) MCG/ACT inhaler Inhale 2 puffs into the lungs every 4 (four) hours as needed for wheezing or shortness of breath (coughing fits). 8 g 2   cetirizine (ZYRTEC ALLERGY) 10 MG tablet Take 1 tablet (10 mg total) by mouth daily. 30 tablet 5   EPINEPHrine 0.3 mg/0.3 mL IJ SOAJ injection Inject 0.3 mLs (0.3 mg total) into the muscle as needed for  anaphylaxis. 2 each 1   hydrOXYzine (ATARAX/VISTARIL) 10 MG tablet Take 1 tablet (10 mg total) by mouth at bedtime as needed for itching. 30 tablet 2   mometasone (ELOCON) 0.1 % cream Apply 1 application topically daily as needed (rash). Do not use on the face, neck, armpits or groin area. Do not use more than 3 weeks in a row. 45 g 2   Multiple Vitamin (MULTIVITAMIN WITH MINERALS) TABS Take 1 tablet by mouth every morning.     omeprazole (PRILOSEC) 20 MG capsule Take 1 capsule (20 mg total) by mouth daily. 30 capsule 2   triamcinolone ointment (KENALOG) 0.1 % Apply 1 application topically daily. Use as moisturizer on the leg 453 g 2   No current facility-administered medications on file prior to visit.    BP 118/70 (BP Location: Left Arm, Patient Position: Sitting, Cuff Size: Normal)   Pulse 63   Temp 98 F (36.7 C) (Oral)   Ht 5\' 9"  (1.753 m)   Wt 198 lb (89.8 kg)   SpO2 96%   BMI 29.24 kg/m       Objective:   Physical Exam Vitals and nursing note reviewed.  Constitutional:      Appearance: Normal appearance.  Cardiovascular:     Rate and Rhythm: Normal rate and regular rhythm.     Pulses: Normal pulses.     Heart sounds: Normal heart sounds.  Pulmonary:     Effort: Pulmonary effort is normal.     Breath sounds: Normal breath sounds.  Abdominal:     General: Abdomen is flat. Bowel sounds are normal.     Palpations: Abdomen is soft.  Musculoskeletal:        General: Normal range of motion.     Comments: Swelling noted to  right calf  Skin:    General: Skin is warm and dry.     Capillary Refill: Capillary refill takes less than 2 seconds.  Neurological:     General: No focal deficit present.     Mental Status: He is alert and oriented to person, place, and time.  Psychiatric:        Mood and Affect: Mood normal.        Behavior: Behavior normal.        Thought Content: Thought content normal.        Judgment: Judgment normal.      Assessment & Plan:    1. Mixed hyperlipidemia - Consider staitn  - CBC with Differential/Platelet; Future - Comprehensive metabolic panel; Future - Lipid panel; Future - TSH; Future  2. Benign prostatic hyperplasia without lower urinary tract symptoms  - PSA; Future  3. Heartburn  - omeprazole (PRILOSEC) 20 MG capsule; Take 1 capsule (20 mg total) by mouth daily.  Dispense: 90 capsule; Refill: 1  4. Localized swelling of right lower leg  - Ambulatory referral to Vascular Surgery  [name], NP

## 2020-11-06 NOTE — Patient Instructions (Addendum)
It was great seeing you today   We will follow up with you regarding your blood work   I have referred you to Vascular Surgery

## 2020-11-19 ENCOUNTER — Other Ambulatory Visit: Payer: Self-pay | Admitting: *Deleted

## 2020-11-19 DIAGNOSIS — R600 Localized edema: Secondary | ICD-10-CM

## 2020-11-25 ENCOUNTER — Ambulatory Visit (HOSPITAL_COMMUNITY)
Admission: RE | Admit: 2020-11-25 | Discharge: 2020-11-25 | Disposition: A | Discharge status: Home / Self Care | Payer: Medicare Other | Source: Ambulatory Visit | Attending: Surgery | Admitting: Surgery

## 2020-11-25 ENCOUNTER — Ambulatory Visit (INDEPENDENT_AMBULATORY_CARE_PROVIDER_SITE_OTHER): Payer: Medicare Other | Admitting: Surgery

## 2020-11-25 ENCOUNTER — Other Ambulatory Visit: Payer: Self-pay

## 2020-11-25 ENCOUNTER — Encounter: Payer: Self-pay | Admitting: Surgery

## 2020-11-25 VITALS — BP 115/71 | HR 60 | Temp 97.7°F | Resp 20 | Ht 69.0 in | Wt 204.0 lb

## 2020-11-25 DIAGNOSIS — M79604 Pain in right leg: Secondary | ICD-10-CM

## 2020-11-25 DIAGNOSIS — R600 Localized edema: Secondary | ICD-10-CM | POA: Insufficient documentation

## 2020-11-25 NOTE — Progress Notes (Signed)
[facility] [place]  Patient name: Dean Thompson MRN: [id number] DOB: 19-Nov-1950 Sex: male   REQUESTING PROVIDER:    Sallee Provencal   REASON FOR CONSULT:    Varicose veins  HISTORY OF PRESENT ILLNESS:   GAYLON BENTZ is a 70 y.o. male, who is status post endovenous laser ablation of the left great saphenous vein and 10-20 stab phlebectomies on 10/28/2017.  He was doing well at his 1 week follow-up.  His venous ultrasound showed successful closure with no DVT.  He comes in today with approximate the 33-month history of right calf pain and swelling.  The swelling is limited to his calf region and does not go down onto his foot.  His right leg symptoms are very different from his symptoms on his left leg.  He does feel that the swelling has gone down dramatically in the last week  He denies a family history of DVT or phlebitis or varicose veins.  PAST MEDICAL HISTORY    Past Medical History:  Diagnosis Date   Allergy    Hyperlipidemia    Rotator cuff tear    left     FAMILY HISTORY   Family History  Adopted: Yes  Problem Relation Age of Onset   Heart disease Father    Colon cancer Neg Hx    Colon polyps Neg Hx    Rectal cancer Neg Hx    Stomach cancer Neg Hx    Asthma Neg Hx    Allergic rhinitis Neg Hx    Immunodeficiency Neg Hx    Eczema Neg Hx    Urticaria Neg Hx    Angioedema Neg Hx     SOCIAL HISTORY:   Social History   Socioeconomic History   Marital status: Married    Spouse name: Not on file   Number of children: Not on file   Years of education: Not on file   Highest education level: Not on file  Occupational History   Not on file  Tobacco Use   Smoking status: Never   Smokeless tobacco: Never  Vaping Use   Vaping Use: Never used  Substance and Sexual Activity   Alcohol use: Not Currently   Drug use: Never   Sexual activity: Not on file  Other Topics Concern   Not on file  Social  History Narrative   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       Social Determinants of Health   Financial Resource Strain: Low Risk    Difficulty of Paying Living Expenses: Not hard at all  Food Insecurity: No Food Insecurity   Worried About Charity fundraiser in the Last Year: Never true   [place] in the Last Year: Never true  Transportation Needs: No Transportation Needs   Lack of Transportation (Medical): No   Lack of Transportation (Non-Medical): No  Physical Activity: Sufficiently Active   Days of Exercise per Week: 3 days   Minutes of Exercise per Session: 60 min  Stress: No Stress Concern Present   Feeling of Stress : Not at all  Social Connections: Moderately Integrated   Frequency of Communication with Friends and Family: More than three times a week   Frequency of Social Gatherings with Friends and Family: More than three times a week   Attends Religious Services:  More than 4 times per year   Active Member of Clubs or Organizations: No   Attends Archivist Meetings: Never   Marital Status: Married  Human resources officer Violence: Not At Risk   Fear of Current or Ex-Partner: No   Emotionally Abused: No   Physically Abused: No   Sexually Abused: No    ALLERGIES:    Allergies  Allergen Reactions   Wasp Venom Anaphylaxis   Citrus Other (See Comments)    "nasal congestion" "nasal congestion"   Other     Spicy foods and tomato based foods cause a cough, uses Loratadine with relief    CURRENT MEDICATIONS:    Current Outpatient Medications  Medication Sig Dispense Refill   albuterol (VENTOLIN HFA) 108 (90 Base) MCG/ACT inhaler Inhale 2 puffs into the lungs every 4 (four) hours as needed for wheezing or shortness of breath (coughing fits). 8 g 2   cetirizine (ZYRTEC ALLERGY) 10 MG tablet Take 1 tablet (10 mg total) by mouth daily. 30 tablet 5   EPINEPHrine  0.3 mg/0.3 mL IJ SOAJ injection Inject 0.3 mLs (0.3 mg total) into the muscle as needed for anaphylaxis. 2 each 1   hydrOXYzine (ATARAX/VISTARIL) 10 MG tablet Take 1 tablet (10 mg total) by mouth at bedtime as needed for itching. 30 tablet 2   mometasone (ELOCON) 0.1 % cream Apply 1 application topically daily as needed (rash). Do not use on the face, neck, armpits or groin area. Do not use more than 3 weeks in a row. 45 g 2   Multiple Vitamin (MULTIVITAMIN WITH MINERALS) TABS Take 1 tablet by mouth every morning.     omeprazole (PRILOSEC) 20 MG capsule Take 1 capsule (20 mg total) by mouth daily. 90 capsule 1   triamcinolone ointment (KENALOG) 0.1 % Apply 1 application topically daily. Use as moisturizer on the leg 453 g 2   No current facility-administered medications for this visit.    REVIEW OF SYSTEMS:   [X]  denotes positive finding, [ ]  denotes negative finding Cardiac  Comments:  Chest pain or chest pressure:    Shortness of breath upon exertion:    Short of breath when lying flat:    Irregular heart rhythm:        Vascular    Pain in calf, thigh, or hip brought on by ambulation:    Pain in feet at night that wakes you up from your sleep:     Blood clot in your veins:    Leg swelling:  x       Pulmonary    Oxygen at home:    Productive cough:     Wheezing:         Neurologic    Sudden weakness in arms or legs:     Sudden numbness in arms or legs:     Sudden onset of difficulty speaking or slurred speech:    Temporary loss of vision in one eye:     Problems with dizziness:         Gastrointestinal    Blood in stool:      Vomited blood:         Genitourinary    Burning when urinating:     Blood in urine:        Psychiatric    Major depression:         Hematologic    Bleeding problems:    Problems with blood clotting too easily:        Skin  Rashes or ulcers:        Constitutional    Fever or chills:     PHYSICAL EXAM:   There were no vitals filed  for this visit.  GENERAL: The patient is a well-nourished male, in no acute distress. The vital signs are documented above. CARDIAC: There is a regular rate and rhythm.  VASCULAR: Palpable posterior tibial pulse.  No significant edema PULMONARY: Nonlabored respirations ABDOMEN: Soft and non-tender with normal pitched bowel sounds.  MUSCULOSKELETAL: Prominent right gastrocnemius muscle  NEUROLOGIC: No focal weakness or paresthesias are detected. SKIN: There are no ulcers or rashes noted. PSYCHIATRIC: The patient has a normal affect.  STUDIES:   I have reviewed his reflux study with the following findings:  Right:  - No evidence of deep vein thrombosis seen in the right lower extremity,  from the common femoral through the popliteal veins.  - No evidence of superficial venous thrombosis in the right lower  extremity.  - No evidence of superficial venous reflux seen in the right short  saphenous vein.  - Venous reflux is noted in the right sapheno-femoral junction.  - Venous reflux is noted in the right greater saphenous vein in the thigh  at medial knee  - Complex structure in the proximal to mid posterior calf measuring  approximately 6.64 cm in length with no color flow or Doppler signal.     ASSESSMENT and PLAN   Right leg pain: I discussed with the patient that he really does not have significant reflux in the right saphenous vein.  There is an area of reflux at the saphenofemoral junction and down below the knee, however I do not think that his symptoms are consistent with reflux at this time.  He would also agree that his right leg symptoms are very different from what he experienced when he was having venous symptoms on the left leg.  To me this sounds more musculoskeletal in nature.  That was also somewhat visualized on ultrasound today.  He may benefit from musculoskeletal ultrasound or just continued observation since it is improving.   Leia Alf, MD, FACS [facility] [phone] Pager [phone]

## 2020-11-26 ENCOUNTER — Encounter: Payer: Self-pay | Admitting: Gastroenterology

## 2020-11-29 ENCOUNTER — Telehealth: Payer: Self-pay | Admitting: Adult Health

## 2020-11-29 NOTE — Telephone Encounter (Signed)
Pt notified of PCP response & verb understanding. 

## 2020-11-29 NOTE — Telephone Encounter (Signed)
Ok to place referral for colonoscopy to GI?

## 2020-11-29 NOTE — Telephone Encounter (Signed)
Message from Murphy--  I am supposed to have a colonoscopy appt scheduled? Have not been notified of any appointment. Liberty Handy, [phone]

## 2020-12-02 ENCOUNTER — Ambulatory Visit: Payer: Medicare Other | Admitting: Surgery

## 2020-12-02 ENCOUNTER — Encounter (HOSPITAL_COMMUNITY): Payer: Medicare Other

## 2020-12-03 ENCOUNTER — Encounter: Payer: Self-pay | Admitting: Gastroenterology

## 2020-12-06 ENCOUNTER — Ambulatory Visit: Payer: Medicare Other | Admitting: Vascular Surgery

## 2020-12-06 ENCOUNTER — Encounter (HOSPITAL_COMMUNITY): Payer: Medicare Other

## 2020-12-11 ENCOUNTER — Telehealth: Payer: Self-pay | Admitting: Adult Health

## 2020-12-11 NOTE — Telephone Encounter (Signed)
Left message for patient to call back and schedule Medicare Annual Wellness Visit (AWV) either virtually or in office. Left  my Dean Thompson number [phone]   Last AWV ;12/28/19 please schedule at anytime with [facility] Nurse Health Advisor 1 or 2   This should be a 45 minute visit.

## 2020-12-31 ENCOUNTER — Ambulatory Visit (INDEPENDENT_AMBULATORY_CARE_PROVIDER_SITE_OTHER): Payer: Medicare Other

## 2020-12-31 VITALS — Ht 70.0 in | Wt 195.0 lb

## 2020-12-31 DIAGNOSIS — Z0000 Encounter for general adult medical examination without abnormal findings: Secondary | ICD-10-CM | POA: Diagnosis not present

## 2020-12-31 NOTE — Patient Instructions (Signed)
[name] , Thank you for taking time to come for your Medicare Wellness Visit. I appreciate your ongoing commitment to your health goals. Please review the following plan we discussed and let me know if I can assist you in the future.   Screening recommendations/referrals: Colonoscopy: scheduled for 01/14/2021 Recommended yearly ophthalmology/optometry visit for glaucoma screening and checkup Recommended yearly dental visit for hygiene and checkup  Vaccinations: Influenza vaccine: decline Pneumococcal vaccine: completed 03/04/2017 Tdap vaccine: completed 12/15/2011, due 12/14/2021 Shingles vaccine: discussed   Covid-19:  07/16/2019, 06/17/2019  Advanced directives: Advance directive discussed with you today.   Conditions/risks identified: none  Next appointment: Follow up in one year for your annual wellness visit.   Preventive Care 70 Years and Older, Male Preventive care refers to lifestyle choices and visits with your health care provider that can promote health and wellness. What does preventive care include? A yearly physical exam. This is also called an annual well check. Dental exams once or twice a year. Routine eye exams. Ask your health care provider how often you should have your eyes checked. Personal lifestyle choices, including: Daily care of your teeth and gums. Regular physical activity. Eating a healthy diet. Avoiding tobacco and drug use. Limiting alcohol use. Practicing safe sex. Taking low doses of aspirin every day. Taking vitamin and mineral supplements as recommended by your health care provider. What happens during an annual well check? The services and screenings done by your health care provider during your annual well check will depend on your age, overall health, lifestyle risk factors, and family history of disease. Counseling  Your health care provider may ask you questions about your: Alcohol use. Tobacco use. Drug use. Emotional  well-being. Home and relationship well-being. Sexual activity. Eating habits. History of falls. Memory and ability to understand (cognition). Work and work Statistician. Screening  You may have the following tests or measurements: Height, weight, and BMI. Blood pressure. Lipid and cholesterol levels. These may be checked every 5 years, or more frequently if you are over 25 years old. Skin check. Lung cancer screening. You may have this screening every year starting at age 70 if you have a 30-pack-year history of smoking and currently smoke or have quit within the past 15 years. Fecal occult blood test (FOBT) of the stool. You may have this test every year starting at age 70. Flexible sigmoidoscopy or colonoscopy. You may have a sigmoidoscopy every 5 years or a colonoscopy every 10 years starting at age 26. Prostate cancer screening. Recommendations will vary depending on your family history and other risks. Hepatitis C blood test. Hepatitis B blood test. Sexually transmitted disease (STD) testing. Diabetes screening. This is done by checking your blood sugar (glucose) after you have not eaten for a while (fasting). You may have this done every 1-3 years. Abdominal aortic aneurysm (AAA) screening. You may need this if you are a current or former smoker. Osteoporosis. You may be screened starting at age 39 if you are at high risk. Talk with your health care provider about your test results, treatment options, and if necessary, the need for more tests. Vaccines  Your health care provider may recommend certain vaccines, such as: Influenza vaccine. This is recommended every year. Tetanus, diphtheria, and acellular pertussis (Tdap, Td) vaccine. You may need a Td booster every 10 years. Zoster vaccine. You may need this after age 81. Pneumococcal 13-valent conjugate (PCV13) vaccine. One dose is recommended after age 72. Pneumococcal polysaccharide (PPSV23) vaccine. One dose is recommended after  age 72. Talk to your health care provider about which screenings and vaccines you need and how often you need them. This information is not intended to replace advice given to you by your health care provider. Make sure you discuss any questions you have with your health care provider. Document Released: 03/01/2015 Document Revised: 10/23/2015 Document Reviewed: 12/04/2014 Elsevier Interactive Patient Education  2017 [place] Prevention in the Home Falls can cause injuries. They can happen to people of all ages. There are many things you can do to make your home safe and to help prevent falls. What can I do on the outside of my home? Regularly fix the edges of walkways and driveways and fix any cracks. Remove anything that might make you trip as you walk through a door, such as a raised step or threshold. Trim any bushes or trees on the path to your home. Use bright outdoor lighting. Clear any walking paths of anything that might make someone trip, such as rocks or tools. Regularly check to see if handrails are loose or broken. Make sure that both sides of any steps have handrails. Any raised decks and porches should have guardrails on the edges. Have any leaves, snow, or ice cleared regularly. Use sand or salt on walking paths during winter. Clean up any spills in your garage right away. This includes oil or grease spills. What can I do in the bathroom? Use night lights. Install grab bars by the toilet and in the tub and shower. Do not use towel bars as grab bars. Use non-skid mats or decals in the tub or shower. If you need to sit down in the shower, use a plastic, non-slip stool. Keep the floor dry. Clean up any water that spills on the floor as soon as it happens. Remove soap buildup in the tub or shower regularly. Attach bath mats securely with double-sided non-slip rug tape. Do not have throw rugs and other things on the floor that can make you trip. What can I do in the  bedroom? Use night lights. Make sure that you have a light by your bed that is easy to reach. Do not use any sheets or blankets that are too big for your bed. They should not hang down onto the floor. Have a firm chair that has side arms. You can use this for support while you get dressed. Do not have throw rugs and other things on the floor that can make you trip. What can I do in the kitchen? Clean up any spills right away. Avoid walking on wet floors. Keep items that you use a lot in easy-to-reach places. If you need to reach something above you, use a strong step stool that has a grab bar. Keep electrical cords out of the way. Do not use floor polish or wax that makes floors slippery. If you must use wax, use non-skid floor wax. Do not have throw rugs and other things on the floor that can make you trip. What can I do with my stairs? Do not leave any items on the stairs. Make sure that there are handrails on both sides of the stairs and use them. Fix handrails that are broken or loose. Make sure that handrails are as long as the stairways. Check any carpeting to make sure that it is firmly attached to the stairs. Fix any carpet that is loose or worn. Avoid having throw rugs at the top or bottom of the stairs. If you do have  throw rugs, attach them to the floor with carpet tape. Make sure that you have a light switch at the top of the stairs and the bottom of the stairs. If you do not have them, ask someone to add them for you. What else can I do to help prevent falls? Wear shoes that: Do not have high heels. Have rubber bottoms. Are comfortable and fit you well. Are closed at the toe. Do not wear sandals. If you use a stepladder: Make sure that it is fully opened. Do not climb a closed stepladder. Make sure that both sides of the stepladder are locked into place. Ask someone to hold it for you, if possible. Clearly mark and make sure that you can see: Any grab bars or  handrails. First and last steps. Where the edge of each step is. Use tools that help you move around (mobility aids) if they are needed. These include: Canes. Walkers. Scooters. Crutches. Turn on the lights when you go into a dark area. Replace any light bulbs as soon as they burn out. Set up your furniture so you have a clear path. Avoid moving your furniture around. If any of your floors are uneven, fix them. If there are any pets around you, be aware of where they are. Review your medicines with your doctor. Some medicines can make you feel dizzy. This can increase your chance of falling. Ask your doctor what other things that you can do to help prevent falls. This information is not intended to replace advice given to you by your health care provider. Make sure you discuss any questions you have with your health care provider. Document Released: 11/29/2008 Document Revised: 07/11/2015 Document Reviewed: 03/09/2014 Elsevier Interactive Patient Education  2017 [employer].

## 2020-12-31 NOTE — Progress Notes (Signed)
I connected with  Dean Thompson today via telehealth video enabled device and verified that I am speaking with the correct person using two identifiers.   Location: Patient: home Provider: work  Persons participating in virtual visit: Dean Thompson, Dean Durand LPN  I discussed the limitations, risks, security and privacy concerns of performing an evaluation and management service by video and the availability of in person appointments. The patient expressed understanding and agreed to proceed.   Some vital signs may be absent or patient reported.     Subjective:   Dean Thompson is a 70 y.o. male who presents for Medicare Annual/Subsequent preventive examination.  Review of Systems     Cardiac Risk Factors include: advanced age (>33men, >41 women);male gender     Objective:    Today's Vitals   12/31/20 0831 12/31/20 0832  Weight: 195 lb (88.5 kg)   Height: 5\' 10"  (1.778 m)   PainSc:  2    Body mass index is 27.98 kg/m.  Advanced Directives 12/31/2020 05/21/2020 05/13/2020 12/28/2019 08/30/2017 11/24/2016 04/29/2016  Does Patient Have a Medical Advance Directive? No No No No No No No  Would patient like information on creating a medical advance directive? - No - Patient declined - No - Patient declined Yes (MAU/Ambulatory/Procedural Areas - Information given) - No - Patient declined    Current Medications (verified) Outpatient Encounter Medications as of 12/31/2020  Medication Sig   EPINEPHrine 0.3 mg/0.3 mL IJ SOAJ injection Inject 0.3 mLs (0.3 mg total) into the muscle as needed for anaphylaxis.   Multiple Vitamin (MULTIVITAMIN WITH MINERALS) TABS Take 1 tablet by mouth every morning.   No facility-administered encounter medications on file as of 12/31/2020.    Allergies (verified) Wasp venom, Citrus, and Other   History: Past Medical History:  Diagnosis Date   Allergy    Hyperlipidemia    Rotator cuff tear    left   Past Surgical History:  Procedure  Laterality Date   COLONOSCOPY  2004   in [place] and was normal per pt   CYSTECTOMY  2006   subacious cyst  remove from neck   EXCISION METACARPAL MASS Right 05/21/2020   Procedure: EXCISION MUCOID CYST WITH DISTAL INTERPHALANGEAL JOINT ARTHROTOMY RIGHT MIDDLE FINGER;  Surgeon: Daryll Brod, MD;  Location: [place];  Service: Orthopedics;  Laterality: Right;   ROTATOR CUFF REPAIR Right 1999   SEPTOPLASTY  1982   SHOULDER OPEN ROTATOR CUFF REPAIR  10/01/2011   Procedure: ROTATOR CUFF REPAIR SHOULDER OPEN;  Surgeon: Magnus Sinning, MD;  Location: [facility];  Service: Orthopedics;  Laterality: Left;  Left Shoulder Acrominectomy/Open Rotator Cuff Repair/Distal Clavicle Resection   SPINE SURGERY  2018   Family History  Adopted: Yes  Problem Relation Age of Onset   Heart disease Father    Colon cancer Neg Hx    Colon polyps Neg Hx    Rectal cancer Neg Hx    Stomach cancer Neg Hx    Asthma Neg Hx    Allergic rhinitis Neg Hx    Immunodeficiency Neg Hx    Eczema Neg Hx    Urticaria Neg Hx    Angioedema Neg Hx    Social History   Socioeconomic History   Marital status: Married    Spouse name: Not on file   Number of children: Not on file   Years of education: Not on file   Highest education level: Not on file  Occupational History   Not on file  Tobacco Use   Smoking status: Never   Smokeless tobacco: Never  Vaping Use   Vaping Use: Never used  Substance and Sexual Activity   Alcohol use: Not Currently   Drug use: Never   Sexual activity: Yes  Other Topics Concern   Not on file  Social History Narrative   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       Social Determinants of Health   Financial Resource Strain: Low Risk    Difficulty of Paying Living Expenses: Not hard at all  Food Insecurity: No Food Insecurity   Worried About Charity fundraiser in the Last Year:  Never true   [place] in the Last Year: Never true  Transportation Needs: No Transportation Needs   Lack of Transportation (Medical): No   Lack of Transportation (Non-Medical): No  Physical Activity: Sufficiently Active   Days of Exercise per Week: 3 days   Minutes of Exercise per Session: 90 min  Stress: No Stress Concern Present   Feeling of Stress : Not at all  Social Connections: Not on file    Tobacco Counseling Counseling given: Not Answered   Clinical Intake:  Pre-visit preparation completed: Yes  Pain : 0-10 Pain Score: 2  Pain Type: Acute pain Pain Location: Shoulder Pain Orientation: Left Pain Descriptors / Indicators: Nagging Pain Onset: More than a month ago Pain Frequency: Constant     Nutritional Status: BMI 25 -29 Overweight Nutritional Risks: None Diabetes: No  How often do you need to have someone help you when you read instructions, pamphlets, or other written materials from your doctor or pharmacy?: 1 - Never What is the last grade level you completed in school?: masters degree  Diabetic? no  Interpreter Needed?: No  Information entered by :: NAllen LPN   Activities of Daily Living In your present state of health, do you have any difficulty performing the following activities: 12/31/2020 05/21/2020  Hearing? N N  Vision? N N  Difficulty concentrating or making decisions? N N  Walking or climbing stairs? N N  Dressing or bathing? N N  Doing errands, shopping? N -  Preparing Food and eating ? N -  Using the Toilet? N -  In the past six months, have you accidently leaked urine? N -  Do you have problems with loss of bowel control? N -  Managing your Medications? N -  Managing your Finances? N -  Housekeeping or managing your Housekeeping? N -  Some recent data might be hidden    Patient Care Team: [name], NP as PCP - General (Family Medicine)  Indicate any recent Medical Services you may have received from other than [facility]  providers in the past year (date may be approximate).     Assessment:   This is a routine wellness examination for [name].  Hearing/Vision screen Vision Screening - Comments:: No regular eye exams, [facility]  Dietary issues and exercise activities discussed: Current Exercise Habits: Home exercise routine, Type of exercise: strength training/weights, Time (Minutes): > 60, Frequency (Times/Week): 3, Weekly Exercise (Minutes/Week): 0   Goals Addressed             This Visit's Progress    Patient Stated       12/31/2020, build up strength       Depression Screen PHQ  2/9 Scores 12/31/2020 11/06/2020 12/28/2019 10/17/2019 10/12/2016 10/09/2015  PHQ - 2 Score 0 0 0 0 0 0  PHQ- 9 Score - - 0 0 - -    Fall Risk Fall Risk  12/31/2020 12/30/2020 11/06/2020 12/28/2019 10/17/2019  Falls in the past year? 0 0 0 0 0  Comment - - - - -  Number falls in past yr: - - 0 0 0  Comment - - - - -  Injury with Fall? - - 0 0 0  Risk for fall due to : No Fall Risks - No Fall Risks No Fall Risks -  Follow up Falls evaluation completed;Education provided;Falls prevention discussed - Falls evaluation completed Falls evaluation completed;Falls prevention discussed -    FALL RISK PREVENTION PERTAINING TO THE HOME:  Any stairs in or around the home? No  If so, are there any without handrails?  N/a Home free of loose throw rugs in walkways, pet beds, electrical cords, etc? Yes  Adequate lighting in your home to reduce risk of falls? Yes   ASSISTIVE DEVICES UTILIZED TO PREVENT FALLS:  Life alert? No  Use of a cane, walker or w/c? No  Grab bars in the bathroom? Yes  Shower chair or bench in shower? No  Elevated toilet seat or a handicapped toilet? Yes   TIMED UP AND GO:  Was the test performed? No .      Cognitive Function:     6CIT Screen 12/31/2020  What Year? 0 points  What month? 0 points  What time? 0 points  Count back from 20 0 points  Months in reverse 0 points   Repeat phrase 0 points  Total Score 0    Immunizations Immunization History  Administered Date(s) Administered   Moderna Sars-Covid-2 Vaccination 06/17/2019, 07/16/2019   Pneumococcal Conjugate-13 07/29/2015   Pneumococcal Polysaccharide-23 09/01/2017   Tdap 12/15/2011    TDAP status: Up to date  Flu Vaccine status: Declined, Education has been provided regarding the importance of this vaccine but patient still declined. Advised may receive this vaccine at local pharmacy or Health Dept. Aware to provide a copy of the vaccination record if obtained from local pharmacy or Health Dept. Verbalized acceptance and understanding.  Pneumococcal vaccine status: Up to date  Covid-19 vaccine status: Completed vaccines  Qualifies for Shingles Vaccine? Yes   Zostavax completed No   Shingrix Completed?: No.    Education has been provided regarding the importance of this vaccine. Patient has been advised to call insurance company to determine out of pocket expense if they have not yet received this vaccine. Advised may also receive vaccine at local pharmacy or Health Dept. Verbalized acceptance and understanding.  Screening Tests Health Maintenance  Topic Date Due   Zoster Vaccines- Shingrix (1 of 2) Never done   COVID-19 Vaccine (3 - Moderna risk series) 08/13/2019   COLONOSCOPY (Pts 45-47yrs Insurance coverage will need to be confirmed)  11/10/2020   INFLUENZA VACCINE  05/16/2021 (Originally 09/16/2020)   TETANUS/TDAP  12/14/2021   Pneumonia Vaccine 65+ Years old  Completed   Hepatitis C Screening  Completed   HPV VACCINES  Aged Out    Health Maintenance  Health Maintenance Due  Topic Date Due   Zoster Vaccines- Shingrix (1 of 2) Never done   COVID-19 Vaccine (3 - Moderna risk series) 08/13/2019   COLONOSCOPY (Pts 45-73yrs Insurance coverage will need to be confirmed)  11/10/2020    Colorectal cancer screening: Type of screening: Colonoscopy. Completed 11/11/2015. Repeat every 5  years  Lung Cancer Screening: (Low Dose CT Chest recommended if Age 53-80 years, 30 pack-year currently smoking OR have quit w/in 15years.) does not qualify.   Lung Cancer Screening Referral: no  Additional Screening:  Hepatitis C Screening: does qualify; Completed 09/01/2017  Vision Screening: Recommended annual ophthalmology exams for early detection of glaucoma and other disorders of the eye. Is the patient up to date with their annual eye exam?  No  Who is the provider or what is the name of the office in which the patient attends annual eye exams? [facility] If pt is not established with a provider, would they like to be referred to a provider to establish care? No .   Dental Screening: Recommended annual dental exams for proper oral hygiene  Community Resource Referral / Chronic Care Management: CRR required this visit?  No   CCM required this visit?  No      Plan:     I have personally reviewed and noted the following in the patient's chart:   Medical and social history Use of alcohol, tobacco or illicit drugs  Current medications and supplements including opioid prescriptions. Patient is not currently taking opioid prescriptions. Functional ability and status Nutritional status Physical activity Advanced directives List of other physicians Hospitalizations, surgeries, and ER visits in previous 12 months Vitals Screenings to include cognitive, depression, and falls Referrals and appointments  In addition, I have reviewed and discussed with patient certain preventive protocols, quality metrics, and best practice recommendations. A written personalized care plan for preventive services as well as general preventive health recommendations were provided to patient.     Kellie Simmering, LPN   [id number]   Nurse Notes: complaints of pain in left shoulder. Appointment made with [facility] for next week.

## 2021-01-07 ENCOUNTER — Telehealth: Payer: Self-pay | Admitting: *Deleted

## 2021-01-07 ENCOUNTER — Encounter: Payer: Self-pay | Admitting: Adult Health

## 2021-01-07 ENCOUNTER — Ambulatory Visit (AMBULATORY_SURGERY_CENTER): Payer: Medicare Other | Admitting: *Deleted

## 2021-01-07 ENCOUNTER — Ambulatory Visit (INDEPENDENT_AMBULATORY_CARE_PROVIDER_SITE_OTHER): Payer: Medicare Other | Admitting: Adult Health

## 2021-01-07 VITALS — Ht 69.0 in | Wt 195.0 lb

## 2021-01-07 VITALS — BP 120/70 | HR 59 | Temp 97.6°F | Ht 70.0 in | Wt 207.0 lb

## 2021-01-07 DIAGNOSIS — Z86010 Personal history of colonic polyps: Secondary | ICD-10-CM

## 2021-01-07 DIAGNOSIS — M25512 Pain in left shoulder: Secondary | ICD-10-CM

## 2021-01-07 DIAGNOSIS — K219 Gastro-esophageal reflux disease without esophagitis: Secondary | ICD-10-CM | POA: Diagnosis not present

## 2021-01-07 MED ORDER — PEG 3350-KCL-NA BICARB-NACL 420 G PO SOLR
4000.0000 mL | Freq: Once | ORAL | 0 refills | Status: AC
Start: 2021-01-07 — End: 2021-01-07

## 2021-01-07 MED ORDER — OMEPRAZOLE 40 MG PO CPDR: 40.0000 mg | DELAYED_RELEASE_CAPSULE | Freq: Every day | ORAL | 3 refills | Status: DC

## 2021-01-07 MED ORDER — METHYLPREDNISOLONE ACETATE 80 MG/ML IJ SUSP
80.0000 mg | Freq: Once | INTRAMUSCULAR | Status: AC
  Administered 2021-01-07: 80 mg via INTRA_ARTICULAR

## 2021-01-07 NOTE — Telephone Encounter (Signed)
Attempted to reach pt again at 8:10am.  LM for pt to call back to reschedule PV.  Otherwise procedure for 01/14/21 would be cancelled.

## 2021-01-07 NOTE — Progress Notes (Signed)
Subjective:    Patient ID: Dean Thompson, male    DOB: [birth year], 70 y.o.   MRN: [id number]  HPI 70 year old male who  has a past medical history of Allergy, Hyperlipidemia, and Rotator cuff tear.  He presents to the office today for pain in his left shoulder. Reports " feels like pin and needles in my shoulder" He has also limited ROM at times. He has had rotator cuff surgery on both rotator cuffs and symptoms feel slightly the same.  Symptoms have been present for the last two months? Has been taking Motrin which seems to help. Last xray in 2017 showed.   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 Probable calcific tendinitis of the supraspinatus tendon. No acute bony abnormality.  GERD - has had a cough after eating every meal for the last month. Was seen by Allergist in the past and could not find a cause. Denies heart burn or shortness of breath    Review of Systems See HPI   Past Medical History:  Diagnosis Date   Allergy    Hyperlipidemia    Rotator cuff tear    left    Social History   Socioeconomic History   Marital status: Married    Spouse name: Not on file   Number of children: Not on file   Years of education: Not on file   Highest education level: Not on file  Occupational History   Not on file  Tobacco Use   Smoking status: Never   Smokeless tobacco: Never  Vaping Use   Vaping Use: Never used  Substance and Sexual Activity   Alcohol use: Not Currently   Drug use: Never   Sexual activity: Yes  Other Topics Concern   Not on file  Social History Narrative   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       Social Determinants of Health   Financial Resource Strain: Low Risk    Difficulty of Paying Living Expenses: Not hard at all  Food Insecurity: No Food Insecurity   Worried About Charity fundraiser in the Last Year: Never true   [place] in the Last Year: Never true  Transportation Needs: No Transportation Needs   Lack of Transportation (Medical): No   Lack of Transportation (Non-Medical): No  Physical Activity: Sufficiently Active   Days of Exercise per Week: 3 days   Minutes of Exercise per Session: 90 min  Stress: No Stress Concern Present   Feeling of Stress : Not at all  Social Connections: Not on file  Intimate Partner Violence: Not on file    Past Surgical History:  Procedure Laterality Date   COLONOSCOPY  2004   in [place] and was normal per pt   CYSTECTOMY  2006   subacious cyst  remove from neck   EXCISION METACARPAL MASS Right 05/21/2020   Procedure: EXCISION MUCOID CYST WITH DISTAL INTERPHALANGEAL JOINT ARTHROTOMY RIGHT MIDDLE FINGER;  Surgeon: Daryll Brod, MD;  Location: [place];  Service: Orthopedics;  Laterality: Right;   ROTATOR CUFF REPAIR Right 1999   SEPTOPLASTY  1982   SHOULDER OPEN ROTATOR CUFF REPAIR  10/01/2011   Procedure: ROTATOR CUFF REPAIR SHOULDER OPEN;  Surgeon: Magnus Sinning, MD;  Location: [facility];  Service: Orthopedics;  Laterality: Left;  Left Shoulder  Acrominectomy/Open Rotator Cuff Repair/Distal Clavicle Resection   SPINE SURGERY  2018    Family History  Adopted: Yes  Problem Relation Age of Onset   Heart disease Father    Colon cancer Neg Hx    Colon polyps Neg Hx    Rectal cancer Neg Hx    Stomach cancer Neg Hx    Asthma Neg Hx    Allergic rhinitis Neg Hx    Immunodeficiency Neg Hx    Eczema Neg Hx    Urticaria Neg Hx    Angioedema Neg Hx     Allergies  Allergen Reactions   Wasp Venom Anaphylaxis   Citrus Other (See Comments)    "nasal congestion" "nasal congestion"   Other     Spicy foods and tomato based foods cause a cough, uses Loratadine with relief    Current Outpatient Medications on File Prior to Visit  Medication Sig Dispense Refill   EPINEPHrine 0.3 mg/0.3 mL IJ SOAJ injection Inject 0.3 mLs (0.3 mg total) into the muscle as  needed for anaphylaxis. 2 each 1   Multiple Vitamin (MULTIVITAMIN WITH MINERALS) TABS Take 1 tablet by mouth every morning.     No current facility-administered medications on file prior to visit.    BP 120/70   Pulse (!) 59   Temp 97.6 F (36.4 C) (Oral)   Ht 5\' 10"  (1.778 m)   Wt 207 lb (93.9 kg)   SpO2 99%   BMI 29.70 kg/m       Objective:   Physical Exam Vitals and nursing note reviewed.  Constitutional:      Appearance: Normal appearance.  Cardiovascular:     Rate and Rhythm: Normal rate and regular rhythm.     Pulses: Normal pulses.     Heart sounds: Normal heart sounds.  Pulmonary:     Effort: Pulmonary effort is normal.     Breath sounds: Normal breath sounds.  Musculoskeletal:     Left shoulder: No tenderness or bony tenderness. Decreased range of motion. Normal strength. Normal pulse.  Skin:    General: Skin is warm and dry.  Neurological:     Mental Status: He is alert.       Assessment & Plan:  1. Acute pain of left shoulder - Likely osteoarthritis or rotator cuff injury. Cannot r/o Thoracic Outlet syndrome. Discusses options for treatment. Will trial depo medrol injection  Shoulder injection Verbal consent obtained and verified. Sterile betadine prep. Furthur cleansed with alcohol. Topical analgesic spray: Ethyl chloride. Joint: right subacromial injection Approached in typical fashion with: posterior approach Completed without difficulty Meds: 3 cc lidocaine 2% no epi, 1 cc depomedrol 80mg /cc Needle:1.5 inch 25 gauge Aftercare instructions and Red flags advised. Immediate improvement in pain noted  - methylPREDNISolone acetate (DEPO-MEDROL) injection 80 mg - Follow up if not improved or resolved. Can consider MRI    2. Gastroesophageal reflux disease without esophagitis - Will trial him on Prilosec 40 mg daily x 30 days  - omeprazole (PRILOSEC) 40 MG capsule; Take 1 capsule (40 mg total) by mouth daily.  Dispense: 30 capsule; Refill:  3   [name], NP

## 2021-01-07 NOTE — Progress Notes (Signed)
No egg or soy allergy known to patient  No issues known to pt with past sedation with any surgeries or procedures Patient denies ever being told they had issues or difficulty with intubation  No FH of Malignant Hyperthermia Pt is not on diet pills Pt is not on  home 02  Pt is not on blood thinners  Pt denies issues with constipation  No A fib or A flutter  NO PA's for preps discussed with pt In PV today  Discussed with pt there will be an out-of-pocket cost for prep and that varies from $0 to 70 +  dollars - pt verbalized understanding   Due to the COVID-19 pandemic we are asking patients to follow certain guidelines in PV and the [place]   Pt aware of COVID protocols and [facility] guidelines   Pt missed first phone call and was at an appointment.  Reviewed health history and medication list with patient but pt asked that we not review prep instructions.  Encouraged him to review instructions when received in the mail and call with any questions.

## 2021-01-07 NOTE — Telephone Encounter (Signed)
LM for pt at 8:30am.  Will try to reach again for PV.

## 2021-01-12 ENCOUNTER — Encounter: Payer: Self-pay | Admitting: Gastroenterology

## 2021-01-12 ENCOUNTER — Encounter: Payer: Self-pay | Admitting: Adult Health

## 2021-01-12 DIAGNOSIS — J189 Pneumonia, unspecified organism: Secondary | ICD-10-CM | POA: Diagnosis not present

## 2021-01-12 DIAGNOSIS — R059 Cough, unspecified: Secondary | ICD-10-CM | POA: Diagnosis not present

## 2021-01-13 NOTE — Telephone Encounter (Signed)
Thank you for the note.  This patient needs to see me in the office before any endoscopic procedures.  Need to discuss his symptoms, especially considering this report of recent pneumonia.  It is not yet clear to me that GERD is responsible for his cough or if upper endoscopy is necessary.  He needs to see his primary care provider before the office visit with me, and it would be ideal if he could provide them at least chest x-ray report or what ever records are available from his recent ED visit.  -HD

## 2021-01-14 ENCOUNTER — Encounter: Payer: Medicare Other | Admitting: Gastroenterology

## 2021-01-14 NOTE — Telephone Encounter (Signed)
This has been taking care of. Pt has been scheduled to see Tommi Rumps for Xray concerns.

## 2021-01-17 ENCOUNTER — Ambulatory Visit (INDEPENDENT_AMBULATORY_CARE_PROVIDER_SITE_OTHER): Payer: Medicare Other | Admitting: Adult Health

## 2021-01-17 ENCOUNTER — Ambulatory Visit (INDEPENDENT_AMBULATORY_CARE_PROVIDER_SITE_OTHER): Payer: Medicare Other

## 2021-01-17 ENCOUNTER — Encounter: Payer: Self-pay | Admitting: Adult Health

## 2021-01-17 ENCOUNTER — Other Ambulatory Visit: Payer: Self-pay

## 2021-01-17 VITALS — BP 122/78 | HR 75 | Temp 98.2°F | Wt 201.2 lb

## 2021-01-17 DIAGNOSIS — Z8701 Personal history of pneumonia (recurrent): Secondary | ICD-10-CM | POA: Diagnosis not present

## 2021-01-17 DIAGNOSIS — J189 Pneumonia, unspecified organism: Secondary | ICD-10-CM

## 2021-01-17 NOTE — Progress Notes (Signed)
Subjective:    Patient ID: Dean Thompson, male    DOB: 1950-10-25, 70 y.o.   MRN: [id number]  HPI 70 year old male who  has a past medical history of Allergy, Arthritis, GERD (gastroesophageal reflux disease), Hyperlipidemia, and Rotator cuff tear.  Patient was in [place] over Thanksgiving weekend and was diagnosed with " multifocal pneumonia with 4 places on his lungs".  He was placed on amoxicillin and doxycycline. His cough became worse with discolored phlegm and he developed shortness of breath and wheezing.   He reports that he is feeling about 90 percent better. Still have has some chest congestion and productive cough. Has not had any fevers or chills. He has one day left of antibiotic therapy   He was seen on 01/07/2021 in this office after complaining of 3 weeks of a cough after eating every meal.  Did not have a cough when he was not eating.  Denied shortness of breath he was not having fevers at this time.   Review of Systems See HPI   Past Medical History:  Diagnosis Date   Allergy    Arthritis    GERD (gastroesophageal reflux disease)    Hyperlipidemia    Rotator cuff tear    left    Social History   Socioeconomic History   Marital status: Married    Spouse name: Not on file   Number of children: Not on file   Years of education: Not on file   Highest education level: Not on file  Occupational History   Not on file  Tobacco Use   Smoking status: Never   Smokeless tobacco: Never  Vaping Use   Vaping Use: Never used  Substance and Sexual Activity   Alcohol use: Not Currently    Comment: rarely   Drug use: Never   Sexual activity: Yes  Other Topics Concern   Not on file  Social History Narrative   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       Social Determinants of Health   Financial Resource Strain: Low Risk    Difficulty of Paying Living Expenses: Not  hard at all  Food Insecurity: No Food Insecurity   Worried About Charity fundraiser in the Last Year: Never true   [place] in the Last Year: Never true  Transportation Needs: No Transportation Needs   Lack of Transportation (Medical): No   Lack of Transportation (Non-Medical): No  Physical Activity: Sufficiently Active   Days of Exercise per Week: 3 days   Minutes of Exercise per Session: 90 min  Stress: No Stress Concern Present   Feeling of Stress : Not at all  Social Connections: Not on file  Intimate Partner Violence: Not on file    Past Surgical History:  Procedure Laterality Date   COLONOSCOPY  2004   in [place] and was normal per pt   CYSTECTOMY  2006   subacious cyst  remove from neck   EXCISION METACARPAL MASS Right 05/21/2020   Procedure: EXCISION MUCOID CYST WITH DISTAL INTERPHALANGEAL JOINT ARTHROTOMY RIGHT MIDDLE FINGER;  Surgeon: Daryll Brod, MD;  Location: [facility];  Service: Orthopedics;  Laterality: Right;   ROTATOR CUFF REPAIR Right 1999   SEPTOPLASTY  1982   SHOULDER OPEN ROTATOR CUFF REPAIR  10/01/2011   Procedure: ROTATOR  CUFF REPAIR SHOULDER OPEN;  Surgeon: Magnus Sinning, MD;  Location: [facility];  Service: Orthopedics;  Laterality: Left;  Left Shoulder Acrominectomy/Open Rotator Cuff Repair/Distal Clavicle Resection   SPINE SURGERY  2018    Family History  Adopted: Yes  Problem Relation Age of Onset   Heart disease Father    Colon cancer Neg Hx    Colon polyps Neg Hx    Rectal cancer Neg Hx    Stomach cancer Neg Hx    Asthma Neg Hx    Allergic rhinitis Neg Hx    Immunodeficiency Neg Hx    Eczema Neg Hx    Urticaria Neg Hx    Angioedema Neg Hx     Allergies  Allergen Reactions   Wasp Venom Anaphylaxis   Citrus Other (See Comments)    "nasal congestion" "nasal congestion"   Other     Spicy foods and tomato based foods cause a cough, uses Loratadine with relief    Current Outpatient Medications on File Prior to Visit   Medication Sig Dispense Refill   EPINEPHrine 0.3 mg/0.3 mL IJ SOAJ injection Inject 0.3 mLs (0.3 mg total) into the muscle as needed for anaphylaxis. 2 each 1   Multiple Vitamin (MULTIVITAMIN WITH MINERALS) TABS Take 1 tablet by mouth every morning.     omeprazole (PRILOSEC) 40 MG capsule Take 1 capsule (40 mg total) by mouth daily. (Patient not taking: Reported on 01/17/2021) 30 capsule 3   No current facility-administered medications on file prior to visit.    BP 122/78 (BP Location: Left Arm, Patient Position: Sitting, Cuff Size: Normal)   Pulse 75   Temp 98.2 F (36.8 C) (Oral)   Wt 201 lb 3.2 oz (91.3 kg)   SpO2 96%   BMI 29.71 kg/m       Objective:   Physical Exam Vitals and nursing note reviewed.  Constitutional:      Appearance: Normal appearance.  Cardiovascular:     Rate and Rhythm: Normal rate and regular rhythm.     Pulses: Normal pulses.     Heart sounds: Normal heart sounds.  Pulmonary:     Effort: Pulmonary effort is normal. No respiratory distress.     Breath sounds: No stridor. Examination of the left-upper field reveals wheezing. Wheezing present. No rhonchi or rales.     Comments: Wheezing in upper left which cleared with coughing. Abdominal:     General: Abdomen is flat. Bowel sounds are normal.     Palpations: Abdomen is soft.  Neurological:     Mental Status: He is alert.      Assessment & Plan:  1. Community acquired pneumonia, unspecified laterality - I am wondering if he actually aspirated due to his cough. I do not think he had pneumonia when I saw him. Glad he is feeling better. Will check chest xray today to see if interval clearing but will likely need to repeat xray in one month  - DG Chest 2 View; Future  [name], NP  Time spent on chart review, time with patient; discussion of pneumonia home monitoring , treatment, follow up plan, and documentation 30 minutes

## 2021-02-18 ENCOUNTER — Ambulatory Visit (INDEPENDENT_AMBULATORY_CARE_PROVIDER_SITE_OTHER): Payer: Medicare Other | Admitting: Gastroenterology

## 2021-02-18 ENCOUNTER — Encounter: Payer: Self-pay | Admitting: Gastroenterology

## 2021-02-18 VITALS — BP 104/60 | HR 80 | Ht 70.0 in | Wt 205.4 lb

## 2021-02-18 DIAGNOSIS — Z86010 Personal history of colonic polyps: Secondary | ICD-10-CM | POA: Diagnosis not present

## 2021-02-18 DIAGNOSIS — R053 Chronic cough: Secondary | ICD-10-CM | POA: Diagnosis not present

## 2021-02-18 NOTE — Patient Instructions (Signed)
If you are age 71 or older, your body mass index should be between 23-30. Your Body mass index is 29.47 kg/m. If this is out of the aforementioned range listed, please consider follow up with your Primary Care Provider.  If you are age 53 or younger, your body mass index should be between 19-25. Your Body mass index is 29.47 kg/m. If this is out of the aformentioned range listed, please consider follow up with your Primary Care Provider.   ________________________________________________________  The [facility] providers would like to encourage you to use [facility] to communicate with providers for non-urgent requests or questions.  Due to long hold times on the telephone, sending your provider a message by [facility] may be a faster and more efficient way to get a response.  Please allow 48 business hours for a response.  Please remember that this is for non-urgent requests.  _______________________________________________________  Dean Thompson have been scheduled for an endoscopy and colonoscopy. Please follow the written instructions given to you at your visit today. Please pick up your prep supplies at the pharmacy within the next 1-3 days. If you use inhalers (even only as needed), please bring them with you on the day of your procedure.   It was a pleasure to see you today!  Thank you for trusting me with your gastrointestinal care!

## 2021-02-18 NOTE — Progress Notes (Signed)
[facility] Consult Note:  History: Dean Thompson 02/18/2021  Referring provider: [name], NP  Reason for consult/chief complaint: Colonoscopy (Hx of colon polyps )   Subjective  HPI: Dean Thompson was scheduled for colonoscopy in November but had to cancel when [place] the [place] and seen in a local ED and diagnosed with pneumonia. The ED provider told him he should consider having an EGD, as perhaps reflux may have been responsible for the pneumonia.  Saw PCP soon after, feeling much better on antibiotics, repeat chest x-ray as below. Says in ED had NO covid or flu testing. Year of frequent mucus and sputum, went to allergist.  PCP and allergist thought ? GERD.No PFTs or pulmonary consult. Nocturnal ? Regurgitation only one episode "bile".  Cough often happens after eating. Denies dysphagia or odynophagia nausea or vomiting. Bowel habits regular without rectal bleeding.  Last colonoscopy 11/11/2015, complete exam, good prep, subcentimeter cecal tubular adenoma ROS:  Review of Systems  Constitutional:  Negative for appetite change and unexpected weight change.  HENT:  Negative for mouth sores and voice change.   Eyes:  Negative for pain and redness.  Respiratory:  Negative for cough and shortness of breath.   Cardiovascular:  Negative for chest pain and palpitations.  Genitourinary:  Negative for dysuria and hematuria.  Musculoskeletal:  Positive for arthralgias. Negative for myalgias.  Skin:  Negative for pallor and rash.  Neurological:  Negative for weakness and headaches.  Hematological:  Negative for adenopathy.    Past Medical History: Past Medical History:  Diagnosis Date   Allergy    Arthritis    GERD (gastroesophageal reflux disease)    Hyperlipidemia    Rotator cuff tear    left     Past Surgical History: Past Surgical History:  Procedure Laterality Date   COLONOSCOPY  2004   in [place] and was normal per pt    CYSTECTOMY  2006   subacious cyst  remove from neck   EXCISION METACARPAL MASS Right 05/21/2020   Procedure: EXCISION MUCOID CYST WITH DISTAL INTERPHALANGEAL JOINT ARTHROTOMY RIGHT MIDDLE FINGER;  Surgeon: Daryll Brod, MD;  Location: [facility];  Service: Orthopedics;  Laterality: Right;   ROTATOR CUFF REPAIR Right 1999   SEPTOPLASTY  1982   SHOULDER OPEN ROTATOR CUFF REPAIR  10/01/2011   Procedure: ROTATOR CUFF REPAIR SHOULDER OPEN;  Surgeon: Magnus Sinning, MD;  Location: [facility];  Service: Orthopedics;  Laterality: Left;  Left Shoulder Acrominectomy/Open Rotator Cuff Repair/Distal Clavicle Resection   SPINE SURGERY  2018     Family History: Family History  Adopted: Yes  Problem Relation Age of Onset   Heart disease Father    Colon cancer Neg Hx    Colon polyps Neg Hx    Rectal cancer Neg Hx    Stomach cancer Neg Hx    Asthma Neg Hx    Allergic rhinitis Neg Hx    Immunodeficiency Neg Hx    Eczema Neg Hx    Urticaria Neg Hx    Angioedema Neg Hx     Social History: Social History   Socioeconomic History   Marital status: Married    Spouse name: Not on file   Number of children: Not on file   Years of education: Not on file   Highest education level: Not on file  Occupational History   Not on file  Tobacco Use   Smoking status: Never   Smokeless tobacco: Never  Vaping Use  Vaping Use: Never used  Substance and Sexual Activity   Alcohol use: Not Currently    Comment: rarely   Drug use: Never   Sexual activity: Yes  Other Topics Concern   Not on file  Social History Narrative   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       Social Determinants of Health   Financial Resource Strain: Low Risk    Difficulty of Paying Living Expenses: Not hard at all  Food Insecurity: No Food Insecurity   Worried About Charity fundraiser in the Last Year: Never true   [place] in the Last Year: Never true  Transportation Needs: No Transportation Needs   Lack of Transportation (Medical): No   Lack of Transportation (Non-Medical): No  Physical Activity: Sufficiently Active   Days of Exercise per Week: 3 days   Minutes of Exercise per Session: 90 min  Stress: No Stress Concern Present   Feeling of Stress : Not at all  Social Connections: Not on file    Allergies: Allergies  Allergen Reactions   Wasp Venom Anaphylaxis   Citrus Other (See Comments)    "nasal congestion" "nasal congestion"   Other     Spicy foods and tomato based foods cause a cough, uses Loratadine with relief    Outpatient Meds: Current Outpatient Medications  Medication Sig Dispense Refill   EPINEPHrine 0.3 mg/0.3 mL IJ SOAJ injection Inject 0.3 mLs (0.3 mg total) into the muscle as needed for anaphylaxis. 2 each 1   Multiple Vitamin (MULTIVITAMIN WITH MINERALS) TABS Take 1 tablet by mouth every morning.     omeprazole (PRILOSEC) 40 MG capsule Take 1 capsule (40 mg total) by mouth daily. (Patient taking differently: Take 40 mg by mouth daily. Takes only 2-3 x a week) 30 capsule 3   No current facility-administered medications for this visit.    PPI started by PCP few days prior to recent PNA Dx Took daily for about 2 weeks, no clear change.  Now takes 2-3 x/wk as needed for cough/sputum  ___________________________________________________________________ Objective   Exam:  BP 104/60 (BP Location: Left Arm, Patient Position: Sitting, Cuff Size: Normal)    Pulse 80    Ht 5\' 10"  (1.778 m)    Wt 205 lb 6.4 oz (93.2 kg)    BMI 29.47 kg/m  Wt Readings from Last 3 Encounters:  02/18/21 205 lb 6.4 oz (93.2 kg)  01/17/21 201 lb 3.2 oz (91.3 kg)  01/07/21 207 lb (93.9 kg)    General: Well-appearing, normal vocal quality Eyes: sclera anicteric, no redness ENT: oral mucosa moist without lesions, no cervical or supraclavicular lymphadenopathy CV: RRR without murmur, S1/S2,  no JVD, no peripheral edema Resp: clear to auscultation bilaterally, normal RR and effort noted GI: soft, no tenderness, with active bowel sounds. No guarding or palpable organomegaly noted. Skin; warm and dry, no rash or jaundice noted Neuro: awake, alert and oriented x 3. Normal gross motor function and fluent speech  Labs:  CBC Latest Ref Rng & Units 11/06/2020 10/17/2019 10/13/2018  WBC 4.0 - 10.5 K/uL 6.1 5.4 5.7  Hemoglobin 13.0 - 17.0 g/dL 16.0 16.3 16.6  Hematocrit 39.0 - 52.0 % 46.8 47.5 47.5  Platelets 150.0 - 400.0 K/uL 142.0(L) 136(L) 137.0(L)    Radiologic Studies:  CLINICAL DATA:  71 year old male with history  of pneumonia.   EXAM: CHEST - 2 VIEW   COMPARISON:  No priors.   FINDINGS: Lung volumes are normal. Mild scarring in the lingula. No consolidative airspace disease. No pleural effusions. No pneumothorax. No pulmonary nodule or mass noted. Pulmonary vasculature and the cardiomediastinal silhouette are within normal limits.   IMPRESSION: No radiographic evidence of acute cardiopulmonary disease.     Electronically Signed   By: Vinnie Langton M.D.   On: 01/20/2021 09:20   Assessment: Encounter Diagnoses  Name Primary?   Chronic cough Yes   Personal history of colonic polyps     Chronic cough and sputum production of unclear cause.  Although he seems to note the coughing often occurring after meals, he does not have typical symptoms of reflux and does not have dysphagia to suggest achalasia.  No clear improvement on PPI.    Personal history of colon polyps.  Plan: Remain off PPI since it has not clearly been helping him, and will also help [place] assess for any esophagitis if he truly has reflux. EGD and colonoscopy.  He was agreeable after discussion of procedure and risks.  The benefits and risks of the planned procedure were described in detail with the patient or (when appropriate) their health care proxy.  Risks were outlined as including, but not  limited to, bleeding, infection, perforation, adverse medication reaction leading to cardiac or pulmonary decompensation, pancreatitis (if ERCP).  The limitation of incomplete mucosal visualization was also discussed.  No guarantees or warranties were given.  If no clear evidence of reflux on upper endoscopy, pulmonary consult would seem in order.  Thank you for the courtesy of this consult.  Please call me with any questions or concerns.  Nelida Meuse III  CC: Referring provider noted above

## 2021-03-07 ENCOUNTER — Other Ambulatory Visit: Payer: Self-pay | Admitting: Adult Health

## 2021-03-07 ENCOUNTER — Ambulatory Visit (AMBULATORY_SURGERY_CENTER): Payer: Medicare Other | Admitting: Gastroenterology

## 2021-03-07 ENCOUNTER — Other Ambulatory Visit: Payer: Self-pay | Admitting: Gastroenterology

## 2021-03-07 ENCOUNTER — Encounter: Payer: Self-pay | Admitting: Gastroenterology

## 2021-03-07 VITALS — BP 113/78 | HR 67 | Temp 98.6°F | Resp 12 | Ht 70.0 in | Wt 205.0 lb

## 2021-03-07 DIAGNOSIS — K222 Esophageal obstruction: Secondary | ICD-10-CM

## 2021-03-07 DIAGNOSIS — D123 Benign neoplasm of transverse colon: Secondary | ICD-10-CM | POA: Diagnosis not present

## 2021-03-07 DIAGNOSIS — Z86010 Personal history of colonic polyps: Secondary | ICD-10-CM | POA: Diagnosis not present

## 2021-03-07 DIAGNOSIS — D120 Benign neoplasm of cecum: Secondary | ICD-10-CM | POA: Diagnosis not present

## 2021-03-07 DIAGNOSIS — K449 Diaphragmatic hernia without obstruction or gangrene: Secondary | ICD-10-CM | POA: Diagnosis not present

## 2021-03-07 DIAGNOSIS — R053 Chronic cough: Secondary | ICD-10-CM

## 2021-03-07 DIAGNOSIS — K2100 Gastro-esophageal reflux disease with esophagitis, without bleeding: Secondary | ICD-10-CM

## 2021-03-07 DIAGNOSIS — K219 Gastro-esophageal reflux disease without esophagitis: Secondary | ICD-10-CM

## 2021-03-07 DIAGNOSIS — K635 Polyp of colon: Secondary | ICD-10-CM | POA: Diagnosis not present

## 2021-03-07 MED ORDER — OMEPRAZOLE 40 MG PO CPDR: 40.0000 mg | DELAYED_RELEASE_CAPSULE | Freq: Every day | ORAL | 3 refills | Status: DC

## 2021-03-07 MED ORDER — SODIUM CHLORIDE 0.9 % IV SOLN: 500.0000 mL | Freq: Once | INTRAVENOUS | Status: DC

## 2021-03-07 NOTE — Progress Notes (Signed)
Vitals-CW  Pt's states no medical or surgical changes since previsit or office visit. 

## 2021-03-07 NOTE — Progress Notes (Signed)
A and O x3. Report to RN. Tolerated MAC anesthesia well.Teeth unchanged after procedure. 

## 2021-03-07 NOTE — Progress Notes (Signed)
Called to room to assist during endoscopic procedure.  Patient ID and intended procedure confirmed with present staff. Received instructions for my participation in the procedure from the performing physician.  

## 2021-03-07 NOTE — Progress Notes (Signed)
No changes to clinical history since GI office visit on 02/18/21.  The patient is appropriate for an endoscopic procedure in the ambulatory setting.

## 2021-03-07 NOTE — Op Note (Signed)
[facility] Patient Name: Dean Thompson Procedure Date: 03/07/2021 3:01 PM MRN: [id number] Endoscopist: Mallie Mussel L. Loletha Thompson , MD Age: 71 Referring MD:  Date of Birth: Jul 18, 1950 Gender: Male Account #: [id number] Procedure:                Colonoscopy Indications:              Surveillance: Personal history of adenomatous                            polyps on last colonoscopy > 5 years ago                           cecal TA < 52mm Sept 2017 Medicines:                Monitored Anesthesia Care Procedure:                Pre-Anesthesia Assessment:                           - Prior to the procedure, a History and Physical                            was performed, and patient medications and                            allergies were reviewed. The patient's tolerance of                            previous anesthesia was also reviewed. The risks                            and benefits of the procedure and the sedation                            options and risks were discussed with the patient.                            All questions were answered, and informed consent                            was obtained. Prior Anticoagulants: The patient has                            taken no previous anticoagulant or antiplatelet                            agents. ASA Grade Assessment: II - A patient with                            mild systemic disease. After reviewing the risks                            and benefits, the patient was deemed in  satisfactory condition to undergo the procedure.                           After obtaining informed consent, the colonoscope                            was passed under direct vision. Throughout the                            procedure, the patient's blood pressure, pulse, and                            oxygen saturations were monitored continuously. The                            Olympus CF-HQ190L (Serial# [id number]) Colonoscope was                             introduced through the anus and advanced to the the                            cecum, identified by appendiceal orifice and                            ileocecal valve. The colonoscopy was performed                            without difficulty. The patient tolerated the                            procedure well. The quality of the bowel                            preparation was good. The ileocecal valve,                            appendiceal orifice, and rectum were photographed. Scope In: 3:12:43 PM Scope Out: 3:31:21 PM Scope Withdrawal Time: 0 hours 15 minutes 37 seconds  Total Procedure Duration: 0 hours 18 minutes 38 seconds  Findings:                 The perianal and digital rectal examinations were                            normal.                           A 10-12 mm polyp was found in the ileocecal valve.                            The polyp was semi-sessile. The polyp was removed                            with a piecemeal technique using a cold snare.  Resection and retrieval were complete.                           A 4 mm polyp was found in the proximal transverse                            colon. The polyp was sessile. The polyp was removed                            with a cold snare. Resection and retrieval were                            complete.                           The exam was otherwise without abnormality on                            direct and retroflexion views. Complications:            No immediate complications. Estimated Blood Loss:     Estimated blood loss was minimal. Impression:               - One 10-12 mm polyp at the ileocecal valve,                            removed piecemeal using a cold snare. Resected and                            retrieved.                           - One 4 mm polyp in the proximal transverse colon,                            removed with a cold snare. Resected and  retrieved.                           - The examination was otherwise normal on direct                            and retroflexion views. Recommendation:           - Patient has a contact number available for                            emergencies. The signs and symptoms of potential                            delayed complications were discussed with the                            patient. Return to normal activities tomorrow.  Written discharge instructions were provided to the                            patient.                           - Resume previous diet.                           - Continue present medications.                           - Await pathology results.                           - Repeat colonoscopy is recommended for                            surveillance. The colonoscopy date will be                            determined after pathology results from today's                            exam become available for review.                           - See the other procedure note for documentation of                            additional recommendations. Dean Frieling L. Loletha Carrow, MD 03/07/2021 3:33:52 PM This report has been signed electronically.

## 2021-03-07 NOTE — Patient Instructions (Addendum)
Handouts on polyps, Hiatal hernia,provided.   Await pathology results.  Continue present medications. Resume Omeprazole once daily ( call for refill if needed)  Referrals to ENT ( for direct laryngoscopy) and pulmonary ( PFTs and other testing as appropriate)   YOU HAD AN ENDOSCOPIC PROCEDURE TODAY AT [facility]:   Refer to the procedure report that was given to you for any specific questions about what was found during the examination.  If the procedure report does not answer your questions, please call your gastroenterologist to clarify.  If you requested that your care partner not be given the details of your procedure findings, then the procedure report has been included in a sealed envelope for you to review at your convenience later.  YOU SHOULD EXPECT: Some feelings of bloating in the abdomen. Passage of more gas than usual.  Walking can help get rid of the air that was put into your GI tract during the procedure and reduce the bloating. If you had a lower endoscopy (such as a colonoscopy or flexible sigmoidoscopy) you may notice spotting of blood in your stool or on the toilet paper. If you underwent a bowel prep for your procedure, you may not have a normal bowel movement for a few days.  Please Note:  You might notice some irritation and congestion in your nose or some drainage.  This is from the oxygen used during your procedure.  There is no need for concern and it should clear up in a day or so.  SYMPTOMS TO REPORT IMMEDIATELY:  Following lower endoscopy (colonoscopy or flexible sigmoidoscopy):  Excessive amounts of blood in the stool  Significant tenderness or worsening of abdominal pains  Swelling of the abdomen that is new, acute  Fever of 100F or higher  Following upper endoscopy (EGD)  Vomiting of blood or coffee ground material  New chest pain or pain under the shoulder blades  Painful or persistently difficult swallowing  New shortness of  breath  Fever of 100F or higher  Black, tarry-looking stools  For urgent or emergent issues, a gastroenterologist can be reached at any hour by calling [phone]. Do not use MyChart messaging for urgent concerns.    DIET:  We do recommend a small meal at first, but then you may proceed to your regular diet.  Drink plenty of fluids but you should avoid alcoholic beverages for 24 hours.  ACTIVITY:  You should plan to take it easy for the rest of today and you should NOT DRIVE or use heavy machinery until tomorrow (because of the sedation medicines used during the test).    FOLLOW UP: Our staff will call the number listed on your records 48-72 hours following your procedure to check on you and address any questions or concerns that you may have regarding the information given to you following your procedure. If we do not reach you, we will leave a message.  We will attempt to reach you two times.  During this call, we will ask if you have developed any symptoms of COVID 19. If you develop any symptoms (ie: fever, flu-like symptoms, shortness of breath, cough etc.) before then, please call [phone].  If you test positive for Covid 19 in the 2 weeks post procedure, please call and report this information to [place].    If any biopsies were taken you will be contacted by phone or by letter within the next 1-3 weeks.  Please call us at [phone] if you have not  heard about the biopsies in 3 weeks.    SIGNATURES/CONFIDENTIALITY: You and/or your care partner have signed paperwork which will be entered into your electronic medical record.  These signatures attest to the fact that that the information above on your After Visit Summary has been reviewed and is understood.  Full responsibility of the confidentiality of this discharge information lies with you and/or your care-partner.

## 2021-03-07 NOTE — Op Note (Signed)
[name] Procedure Date: 03/07/2021 2:55 PM MRN: [id number] Endoscopist: Mallie Mussel L. Loletha Thompson , MD Age: 71 Referring MD:  Date of Birth: 1950/03/10 Gender: Male Account #: [id number] Procedure:                Upper GI endoscopy Indications:              Chronic cough - often (but not exclusively)                            occuring after eating                           ? of GERD as cause                           prior allergy evaluation                           no prior ENT or pulmonary evaluation                           no improvement with > 2 week course PPI (recently                            discontinued)                           no dysphagia Medicines:                Monitored Anesthesia Care Procedure:                Pre-Anesthesia Assessment:                           - Prior to the procedure, a History and Physical                            was performed, and patient medications and                            allergies were reviewed. The patient's tolerance of                            previous anesthesia was also reviewed. The risks                            and benefits of the procedure and the sedation                            options and risks were discussed with the patient.                            All questions were answered, and informed consent                            was obtained. Prior Anticoagulants: The patient has  taken no previous anticoagulant or antiplatelet                            agents. ASA Grade Assessment: II - A patient with                            mild systemic disease. After reviewing the risks                            and benefits, the patient was deemed in                            satisfactory condition to undergo the procedure.                           After obtaining informed consent, the endoscope was                            passed under direct vision.  Throughout the                            procedure, the patient's blood pressure, pulse, and                            oxygen saturations were monitored continuously. The                            GIF [id number] #[id number] was introduced through the                            mouth, and advanced to the second part of duodenum.                            The upper GI endoscopy was accomplished without                            difficulty. The patient tolerated the procedure                            well. Scope In: Scope Out: Findings:                 A 2 cm sliding hiatal hernia was present.                           LA Grade A (one or more mucosal breaks less than 5                            mm, not extending between tops of 2 mucosal folds)                            esophagitis was found at the gastroesophageal  junction.                           One benign-appearing, intrinsic mild stenosis was                            found at the gastroesophageal junction. This                            stenosis measured 1.5 cm (inner diameter) x less                            than one cm (in length). The stenosis was                            traversed. This was biopsied with a cold forceps                            for ring disruption - no tissue sent to pathology.                           The stomach was normal.                           The cardia and gastric fundus were normal on                            retroflexion.                           The examined duodenum was normal.                           The larynx could not be visualized. Complications:            No immediate complications. Estimated Blood Loss:     Estimated blood loss was minimal. Impression:               - 2 cm hiatal hernia.                           - LA Grade A reflux esophagitis.                           - Benign-appearing esophageal stenosis. Biopsied.                           -  Normal stomach.                           - Normal examined duodenum.                           While this patient has visible evidence of GERD, it                            is not yet clear to what  extent it is contributing                            to the cough. Other common causes must be evaluated. Recommendation:           - Patient has a contact number available for                            emergencies. The signs and symptoms of potential                            delayed complications were discussed with the                            patient. Return to normal activities tomorrow.                            Written discharge instructions were provided to the                            patient.                           - Resume previous diet.                           - Continue present medications. Resume omeprazole                            once daily. (call for refills if needed)                           - Await pathology results.                           - See the other procedure note for documentation of                            additional recommendations.                           - Referrals to ENT (for direct laryngoscopy) and                            pulmonary (PFTs and other testing as appropriate). Dean Velaquez L. Loletha Carrow, MD 03/07/2021 3:57:49 PM This report has been signed electronically.

## 2021-03-11 ENCOUNTER — Telehealth: Payer: Self-pay

## 2021-03-11 ENCOUNTER — Telehealth: Payer: Self-pay | Admitting: *Deleted

## 2021-03-11 NOTE — Telephone Encounter (Signed)
°  Follow up Call-  Call back number 03/07/2021  Post procedure Call Back phone  # [phone]  Permission to leave phone message Yes  Some recent data might be hidden     Patient questions:  Do you have a fever, pain , or abdominal swelling? No. Pain Score  0 *  Have you tolerated food without any problems? Yes.    Have you been able to return to your normal activities? Yes.    Do you have any questions about your discharge instructions: Diet   No. Medications  No. Follow up visit  No.  Do you have questions or concerns about your Care? No.  Actions: * If pain score is 4 or above: No action needed, pain <4.

## 2021-03-11 NOTE — Telephone Encounter (Signed)
°  Follow up Call-  Call back number 03/07/2021  Post procedure Call Back phone  # [phone]  Permission to leave phone message Yes  Some recent data might be hidden     Patient questions: Message left to call us if necessary.

## 2021-03-18 ENCOUNTER — Encounter: Payer: Self-pay | Admitting: Gastroenterology

## 2021-04-02 NOTE — Progress Notes (Signed)
Synopsis: Referred for chronic cough by [name], NP  Subjective:   PATIENT ID: Dean Thompson GENDER: male DOB: 1950-12-11, MRN: [id number]  Chief Complaint  Patient presents with   Pulmonary Consult    Referred by [name], NP. Pt c/o cough for the past 8-9 months- mainly non prod.    71yM with history of allergy, GERD referred by PCP for chronic cough  He's had cough over the last 10 months to a year. He thinks he may have had covid-19 at some point although he never tested positive for it. Mostly dry cough but could sometimes feel sensation of phlegm in his throat, just couldn't expectorate it. No sensation of PND. He doesn't have overt heartburn but he certainly does cough more when he lies down. He doesn't think he has any solid or liquid food dysphagia. No associated sensations of hoarseness or throat tightness.   Did have bout of pneumonia when staying in [facility] after thanksgiving, given a course of antibiotics - amoxicillin and doxy and did feel better afterward.   Seen by GI 02/18/21 for cough often occurring after eating but not responsive to trial of ppi, underwent EGD 03/07/21 showing hiatal hernia, LA grade A reflux esophagitis, mild esophageal stenosis. As cough seemed out of proportion to findings, referred to pulmonary, ENT.  Since resuming his ppi following EGD he does think his cough is a bit better.   Never had asthma as a kid.   Otherwise pertinent review of systems is negative.  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 No exposures to dusts/solvents/particulates without mask at home or with hobbies. No pets. Has house in [facility] but otherwise stays in [place]. Smoked for only a few months when he was 71 yo.   Past Medical History:  Diagnosis Date   Allergy    Arthritis    GERD (gastroesophageal reflux disease)    Hyperlipidemia    Rotator cuff tear    left     Family History   Adopted: Yes  Problem Relation Age of Onset   Heart disease Father    Colon cancer Neg Hx    Colon polyps Neg Hx    Rectal cancer Neg Hx    Stomach cancer Neg Hx    Asthma Neg Hx    Allergic rhinitis Neg Hx    Immunodeficiency Neg Hx    Eczema Neg Hx    Urticaria Neg Hx    Angioedema Neg Hx      Past Surgical History:  Procedure Laterality Date   COLONOSCOPY  2004   in [place] and was normal per pt   CYSTECTOMY  2006   subacious cyst  remove from neck   EXCISION METACARPAL MASS Right 05/21/2020   Procedure: EXCISION MUCOID CYST WITH DISTAL INTERPHALANGEAL JOINT ARTHROTOMY RIGHT MIDDLE FINGER;  Surgeon: Daryll Brod, MD;  Location: [facility];  Service: Orthopedics;  Laterality: Right;   ROTATOR CUFF REPAIR Right 1999   SEPTOPLASTY  1982   SHOULDER OPEN ROTATOR CUFF REPAIR  10/01/2011   Procedure: ROTATOR CUFF REPAIR SHOULDER OPEN;  Surgeon: Magnus Sinning, MD;  Location: [facility];  Service: Orthopedics;  Laterality: Left;  Left Shoulder Acrominectomy/Open Rotator Cuff Repair/Distal Clavicle Resection   SPINE SURGERY  2018    Social History   Socioeconomic History   Marital status: Married    Spouse name: Not on file   Number of  children: Not on file   Years of education: Not on file   Highest education level: Not on file  Occupational History   Not on file  Tobacco Use   Smoking status: Never   Smokeless tobacco: Never  Vaping Use   Vaping Use: Never used  Substance and Sexual Activity   Alcohol use: Not Currently    Comment: rarely   Drug use: Never   Sexual activity: Yes  Other Topics Concern   Not on file  Social History Narrative   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       Social Determinants of Health   Financial Resource Strain: Low Risk    Difficulty of Paying Living Expenses: Not hard at all  Food Insecurity: No Food Insecurity   Worried  About Charity fundraiser in the Last Year: Never true   [place] in the Last Year: Never true  Transportation Needs: No Transportation Needs   Lack of Transportation (Medical): No   Lack of Transportation (Non-Medical): No  Physical Activity: Sufficiently Active   Days of Exercise per Week: 3 days   Minutes of Exercise per Session: 90 min  Stress: No Stress Concern Present   Feeling of Stress : Not at all  Social Connections: Not on file  Intimate Partner Violence: Not on file     Allergies  Allergen Reactions   Wasp Venom Anaphylaxis   Citrus Other (See Comments)    "nasal congestion" "nasal congestion"   Other     Spicy foods and tomato based foods cause a cough, uses Loratadine with relief     Outpatient Medications Prior to Visit  Medication Sig Dispense Refill   albuterol (VENTOLIN HFA) 108 (90 Base) MCG/ACT inhaler SMARTSIG:2 Puff(s) By Mouth Every 4 Hours PRN     EPINEPHrine 0.3 mg/0.3 mL IJ SOAJ injection Inject 0.3 mLs (0.3 mg total) into the muscle as needed for anaphylaxis. 2 each 1   Multiple Vitamin (MULTIVITAMIN WITH MINERALS) TABS Take 1 tablet by mouth every morning.     omeprazole (PRILOSEC) 40 MG capsule Take 1 capsule (40 mg total) by mouth daily. (Patient taking differently: Take 40 mg by mouth 3 (three) times a week.) 30 capsule 3   No facility-administered medications prior to visit.       Objective:   Physical Exam:  General appearance: 71 y.o., male, NAD, conversant, male, NAD, conversant  Eyes: anicteric sclerae; PERRL, tracking appropriately HENT: NCAT; MMM Neck: Trachea midline; no lymphadenopathy, no JVD Lungs: CTAB, no crackles, no wheeze, with normal respiratory effort CV: RRR, no murmur  Abdomen: Soft, non-tender; non-distended, BS present  Extremities: No peripheral edema, warm Skin: Normal turgor and texture; no rash Psych: Appropriate affect Neuro: Alert and oriented to person and place, no focal deficit     Vitals:   04/03/21 0858  BP:  108/62  Pulse: 63  Temp: 97.9 F (36.6 C)  TempSrc: Oral  SpO2: 97%  Weight: 200 lb (90.7 kg)  Height: 5\' 10"  (1.778 m)   97% on RA BMI Readings from Last 3 Encounters:  04/03/21 28.70 kg/m  03/07/21 29.41 kg/m  02/18/21 29.47 kg/m   Wt Readings from Last 3 Encounters:  04/03/21 200 lb (90.7 kg)  03/07/21 205 lb (93 kg)  02/18/21 205 lb 6.4 oz (93.2 kg)     CBC  Component Value Date/Time   WBC 6.1 11/06/2020 1040   RBC 5.12 11/06/2020 1040   HGB 16.0 11/06/2020 1040   HCT 46.8 11/06/2020 1040   PLT 142.0 (L) 11/06/2020 1040   MCV 91.4 11/06/2020 1040   MCH 31.8 10/17/2019 0731   MCHC 34.3 11/06/2020 1040   RDW 13.5 11/06/2020 1040   LYMPHSABS 1.2 11/06/2020 1040   MONOABS 0.5 11/06/2020 1040   EOSABS 0.2 11/06/2020 1040   BASOSABS 0.0 11/06/2020 1040     Chest Imaging: CXR 01/17/21 reviewed by me, prominent interstitial markings.  Pulmonary Functions Testing Results: No flowsheet data found.     Assessment & Plan:   # Chronic cough: Most significant contributors by history seem like reflux and/or recurrent silent and/or macroaspiration. We'll make sure there's no clear evidence of asthma which seems unlikely, or covid-related or other ILD given prominent interstitial markings on CXR (although this may reflect resolving pneumonia).  Plan: - full PFT including lung volumes, DLCO - CXR today - ppi per GI - flonase and OTC antihistamine recommended during pollen season for component of AR which is not currently bothersome   RTC 2-4 weeks to review PFTs   Maryjane Hurter, MD [facility] 04/03/2021 9:04 AM

## 2021-04-03 ENCOUNTER — Ambulatory Visit (INDEPENDENT_AMBULATORY_CARE_PROVIDER_SITE_OTHER): Payer: Medicare Other | Admitting: Student

## 2021-04-03 ENCOUNTER — Other Ambulatory Visit: Payer: Self-pay

## 2021-04-03 ENCOUNTER — Encounter: Payer: Self-pay | Admitting: Student

## 2021-04-03 ENCOUNTER — Ambulatory Visit (INDEPENDENT_AMBULATORY_CARE_PROVIDER_SITE_OTHER): Payer: Medicare Other

## 2021-04-03 VITALS — BP 108/62 | HR 63 | Temp 97.9°F | Ht 70.0 in | Wt 200.0 lb

## 2021-04-03 DIAGNOSIS — R053 Chronic cough: Secondary | ICD-10-CM

## 2021-04-03 DIAGNOSIS — K2100 Gastro-esophageal reflux disease with esophagitis, without bleeding: Secondary | ICD-10-CM

## 2021-04-03 DIAGNOSIS — R059 Cough, unspecified: Secondary | ICD-10-CM | POA: Diagnosis not present

## 2021-04-03 NOTE — Patient Instructions (Signed)
-   You will be called to schedule your breathing tests on same day as next clinic visit - X ray today - in [place] would use flonase and/or over the counter antihistamine for sinonasal congestion - See you in 2-4 weeks!

## 2021-04-28 ENCOUNTER — Ambulatory Visit (INDEPENDENT_AMBULATORY_CARE_PROVIDER_SITE_OTHER): Payer: Medicare Other | Admitting: Family Medicine

## 2021-04-28 VITALS — BP 110/78 | HR 59 | Temp 98.0°F | Wt 206.0 lb

## 2021-04-28 DIAGNOSIS — R21 Rash and other nonspecific skin eruption: Secondary | ICD-10-CM | POA: Diagnosis not present

## 2021-04-28 NOTE — Progress Notes (Signed)
? ?  Subjective:  ? ? Patient ID: Dean Thompson, male    DOB: 1950-12-06, 71 y.o.   MRN: [id number] ? ?HPI ?Here for the sudden onset of an itchy patch of skin that he woke up with today. No recent trauma. No recent medication changes or travelling. The area was intensely itchy so he has applied Fluocinonide cream a few times. Now it is much less inflamed and it not as bright red as it was. He has never had this before.  ? ? ?Review of Systems  ?Constitutional: Negative.  Negative for activity change.  ?Cardiovascular: Negative.   ?Gastrointestinal: Negative.   ?Skin:  Positive for rash.  ? ?   ?Objective:  ? Physical Exam ?Constitutional:   ?   Appearance: Normal appearance. He is not ill-appearing.  ?Cardiovascular:  ?   Rate and Rhythm: Normal rate and regular rhythm.  ?   Pulses: Normal pulses.  ?   Heart sounds: Normal heart sounds.  ?Pulmonary:  ?   Effort: Pulmonary effort is normal.  ?   Breath sounds: Normal breath sounds.  ?Skin: ?   Comments: The dorsal left lower arm has a well demarcated slightly raised violaceous area about 5 cm X 3 cm in size. This is not tender. He shows me a photo on his cell phone from this morning, and the areas bright red in color   ?Neurological:  ?   Mental Status: He is alert.  ? ? ? ? ? ?   ?Assessment & Plan:  ?Rash of unclear etiology. He can continue to apply Fluocinonide cream BID as needed. If this does not go away this week, I advised him to see his dermatologist, Dr. Darrick Meigs, for further evaluation. ?Alysia Penna, MD ? ? ?

## 2021-04-30 DIAGNOSIS — L309 Dermatitis, unspecified: Secondary | ICD-10-CM | POA: Diagnosis not present

## 2021-05-09 ENCOUNTER — Ambulatory Visit: Payer: Medicare Other | Admitting: Student

## 2021-05-29 ENCOUNTER — Ambulatory Visit (INDEPENDENT_AMBULATORY_CARE_PROVIDER_SITE_OTHER): Payer: Medicare Other | Admitting: Student

## 2021-05-29 ENCOUNTER — Encounter: Payer: Self-pay | Admitting: Student

## 2021-05-29 VITALS — BP 128/74 | HR 65 | Temp 97.7°F | Ht 70.0 in | Wt 201.0 lb

## 2021-05-29 DIAGNOSIS — R053 Chronic cough: Secondary | ICD-10-CM

## 2021-05-29 LAB — PULMONARY FUNCTION TEST
DL/VA % pred: 100 %
DL/VA: 4.08 ml/min/mmHg/L
DLCO cor % pred: 87 %
DLCO cor: 22.79 ml/min/mmHg
DLCO unc % pred: 87 %
DLCO unc: 22.79 ml/min/mmHg
RV % pred: 134 %
RV: 3.3 L
TLC % pred: 140 %
TLC: 9.87 L

## 2021-05-29 NOTE — Progress Notes (Signed)
? ?Synopsis: Referred for chronic cough by [name], NP ? ?Subjective:  ? ?PATIENT ID: Dean Thompson GENDER: male DOB: Feb 08, 1951, MRN: [id number] ? ?Chief Complaint  ?Patient presents with  ? Follow-up  ?  Cough at times/ PFT review   ? ?70yM with history of allergy, GERD referred by PCP for chronic cough ? ?He's had cough over the last 10 months to a year. He thinks he may have had covid-19 at some point although he never tested positive for it. Mostly dry cough but could sometimes feel sensation of phlegm in his throat, just couldn't expectorate it. No sensation of PND. He doesn't have overt heartburn but he certainly does cough more when he lies down. He doesn't think he has any solid or liquid food dysphagia. No associated sensations of hoarseness or throat tightness.  ? ?Did have bout of pneumonia when staying in [facility] after thanksgiving, given a course of antibiotics - amoxicillin and doxy and did feel better afterward.  ? ?Seen by GI 02/18/21 for cough often occurring after eating but not responsive to trial of ppi, underwent EGD 03/07/21 showing hiatal hernia, LA grade A reflux esophagitis, mild esophageal stenosis. As cough seemed out of proportion to findings, referred to pulmonary, ENT. ? ?Since resuming his ppi following EGD he does think his cough is a bit better.  ? ?Never had asthma as a kid.  ? ?He has no family history of lung disease ? 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 No exposures to dusts/solvents/particulates without mask at home or with hobbies. No pets. Has house in [facility] but otherwise stays in [place]. Smoked for only a few months when he was 71 yo. ? ?Interval HPI: ?He says he's overall doing well. No DOE. He's now thinking cough is more food/reflux related. Does occasionally feel some PND, congestion but only with allergy season.  ? ?Otherwise pertinent review of systems is negative. ? ?Past Medical History:  ?Diagnosis Date  ? Allergy   ? Arthritis   ?  GERD (gastroesophageal reflux disease)   ? Hyperlipidemia   ? Rotator cuff tear   ? left  ?  ? ?Family History  ?Adopted: Yes  ?Problem Relation Age of Onset  ? Heart disease Father   ? Colon cancer Neg Hx   ? Colon polyps Neg Hx   ? Rectal cancer Neg Hx   ? Stomach cancer Neg Hx   ? Asthma Neg Hx   ? Allergic rhinitis Neg Hx   ? Immunodeficiency Neg Hx   ? Eczema Neg Hx   ? Urticaria Neg Hx   ? Angioedema Neg Hx   ?  ? ?Past Surgical History:  ?Procedure Laterality Date  ? COLONOSCOPY  2004  ? in [place] and was normal per pt  ? CYSTECTOMY  2006  ? subacious cyst  remove from neck  ? EXCISION METACARPAL MASS Right 05/21/2020  ? Procedure: EXCISION MUCOID CYST WITH DISTAL INTERPHALANGEAL JOINT ARTHROTOMY RIGHT MIDDLE FINGER;  Surgeon: Daryll Brod, MD;  Location: [place];  Service: Orthopedics;  Laterality: Right;  ? ROTATOR CUFF REPAIR Right 1999  ? SEPTOPLASTY  1982  ? SHOULDER OPEN ROTATOR CUFF REPAIR  10/01/2011  ? Procedure: ROTATOR CUFF REPAIR SHOULDER OPEN;  Surgeon: Magnus Sinning, MD;  Location: [facility];  Service: Orthopedics;  Laterality: Left;  Left Shoulder Acrominectomy/Open Rotator Cuff Repair/Distal Clavicle Resection  ? SPINE SURGERY  2018  ? ? ?Social History  ? ?Socioeconomic  History  ? Marital status: Married  ?  Spouse name: Not on file  ? Number of children: Not on file  ? Years of education: Not on file  ? Highest education level: Master's degree (e.g., MA, MS, MEng, MEd, MSW, MBA)  ?Occupational History  ? Not on file  ?Tobacco Use  ? Smoking status: Never  ? Smokeless tobacco: Never  ?Vaping Use  ? Vaping Use: Never used  ?Substance and Sexual Activity  ? Alcohol use: Not Currently  ?  Comment: rarely  ? Drug use: Never  ? Sexual activity: Yes  ?Other Topics Concern  ? Not on file  ?Social History Narrative  ? 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 Divorced  ? Three daughters - One lives in [place], [place],  [place], [place]   ?   ? He likes to golf.   ?   ? ?Social Determinants of Health   ? ?Financial Resource Strain: Low Risk   ? Difficulty of Paying Living Expenses: Not hard at all  ?Food Insecurity: No Food Insecurity  ? Worried About Charity fundraiser in the Last Year: Never true  ? Ran Out of Food in the Last Year: Never true  ?Transportation Needs: No Transportation Needs  ? Lack of Transportation (Medical): No  ? Lack of Transportation (Non-Medical): No  ?Physical Activity: Sufficiently Active  ? Days of Exercise per Week: 3 days  ? Minutes of Exercise per Session: 90 min  ?Stress: No Stress Concern Present  ? Feeling of Stress : Not at all  ?Social Connections: Socially Integrated  ? Frequency of Communication with Friends and Family: Twice a week  ? Frequency of Social Gatherings with Friends and Family: Twice a week  ? Attends Religious Services: More than 4 times per year  ? Active Member of Clubs or Organizations: Yes  ? Attends Archivist Meetings: More than 4 times per year  ? Marital Status: Married  ?Intimate Partner Violence: Not on file  ?  ? ?Allergies  ?Allergen Reactions  ? Wasp Venom Anaphylaxis  ? Citrus Other (See Comments)  ?  "nasal congestion" ?"nasal congestion"  ? Other   ?  Spicy foods and tomato based foods cause a cough, uses Loratadine with relief  ?  ? ?Outpatient Medications Prior to Visit  ?Medication Sig Dispense Refill  ? EPINEPHrine 0.3 mg/0.3 mL IJ SOAJ injection Inject 0.3 mLs (0.3 mg total) into the muscle as needed for anaphylaxis. 2 each 1  ? Multiple Vitamin (MULTIVITAMIN WITH MINERALS) TABS Take 1 tablet by mouth every morning.    ? omeprazole (PRILOSEC) 40 MG capsule Take 1 capsule (40 mg total) by mouth daily. (Patient taking differently: Take 40 mg by mouth 3 (three) times a week.) 30 capsule 3  ? ?No facility-administered medications prior to visit.  ? ? ? ? ? ?Objective:  ? ?Physical Exam: ? ?General appearance: 71 y.o., male, NAD, conversant  ?Eyes: anicteric sclerae; PERRL, tracking appropriately ?HENT: NCAT; MMM ?Neck: Trachea  midline; no lymphadenopathy, no JVD ?Lungs: CTAB, no crackles, no wheeze, with normal respiratory effort ?CV: RRR, no murmur  ?Abdomen: Soft, non-tender; non-distended, BS present  ?Extremities: No peripheral edema, warm ?Skin: Normal turgor and texture; no rash ?Psych: Appropriate affect ?Neuro: Alert and oriented to person and place, no focal deficit  ? ? ? ?Vitals:  ? 05/29/21 1540  ?BP: 128/74  ?Pulse: 65  ?Temp: 97.7 ?F (36.5 ?C)  ?TempSrc: Oral  ?SpO2: 98%  ?Weight: 201  lb (91.2 kg)  ?Height: '5\' 10"'$  (1.778 m)  ? ? ?98% on RA ?BMI Readings from Last 3 Encounters:  ?05/29/21 28.84 kg/m?  ?04/28/21 29.56 kg/m?  ?04/03/21 28.70 kg/m?  ? ?Wt Readings from Last 3 Encounters:  ?05/29/21 201 lb (91.2 kg)  ?04/28/21 206 lb (93.4 kg)  ?04/03/21 200 lb (90.7 kg)  ? ? ? ?CBC ?   ?Component Value Date/Time  ? WBC 6.1 11/06/2020 1040  ? RBC 5.12 11/06/2020 1040  ? HGB 16.0 11/06/2020 1040  ? HCT 46.8 11/06/2020 1040  ? PLT 142.0 (L) 11/06/2020 1040  ? MCV 91.4 11/06/2020 1040  ? MCH 31.8 10/17/2019 0731  ? MCHC 34.3 11/06/2020 1040  ? RDW 13.5 11/06/2020 1040  ? LYMPHSABS 1.2 11/06/2020 1040  ? MONOABS 0.5 11/06/2020 1040  ? EOSABS 0.2 11/06/2020 1040  ? BASOSABS 0.0 11/06/2020 1040  ? ? ? ?Chest Imaging: ?CXR 01/17/21 reviewed by me, prominent interstitial markings. ? ?CXR 04/03/21 reviewed by me essentially unchanged ?Pulmonary Functions Testing Results: ? ?  Latest Ref Rng & Units 05/29/2021  ?  1:43 PM  ?PFT Results  ?DLCO uncorrected ml/min/mmHg 22.79  P  ?DLCO UNC% % 87  P  ?DLCO corrected ml/min/mmHg 22.79  P  ?DLCO COR %Predicted % 87  P  ?DLVA Predicted % 100  P  ?TLC L 9.87  P  ?TLC % Predicted % 140  P  ?RV % Predicted % 134  P  ?  ?P Preliminary result  ? ? ?   ?Assessment & Plan:  ? ?# Chronic cough: ?Most significant contributors by history seem like reflux and/or recurrent silent and/or macroaspiration. Although CXR with prominent interstitial markings especially in bases he has no accompanying dypsnea and his  DLCO, lung volumes were normal. Unfortunately he wasn't able to perform technique to ATS standards for spirometry so we're unable to say if there's any evidence of asthma but I still feel this is less likel

## 2021-05-29 NOTE — Patient Instructions (Signed)
Diffusion capacity and nitrogen washout performed today. ?

## 2021-05-29 NOTE — Patient Instructions (Addendum)
- in Spring would use flonase and/or over the counter antihistamine for sinonasal congestion ?- GERD lifestyle measures below and omeprazole ?- If any trouble with cough or shortness of breath with exertion despite above measures, send message and we'd be happy to see you in clinic again ? ? ?Gastroesophageal Reflux Disease, Adult ?Gastroesophageal reflux (GER) happens when acid from the stomach flows up into the tube that connects the mouth and the stomach (esophagus). Normally, food travels down the esophagus and stays in the stomach to be digested. However, when a person has GER, food and stomach acid sometimes move back up into the esophagus. If this becomes a more serious problem, the person may be diagnosed with a disease called gastroesophageal reflux disease (GERD). GERD occurs when the reflux: ?Happens often. ?Causes frequent or severe symptoms. ?Causes problems such as damage to the esophagus. ?When stomach acid comes in contact with the esophagus, the acid may cause inflammation in the esophagus. Over time, GERD may create small holes (ulcers) in the lining of the esophagus. ?What are the causes? ?This condition is caused by a problem with the muscle between the esophagus and the stomach (lower esophageal sphincter, or LES). Normally, the LES muscle closes after food passes through the esophagus to the stomach. When the LES is weakened or abnormal, it does not close properly, and that allows food and stomach acid to go back up into the esophagus. ?The LES can be weakened by certain dietary substances, medicines, and medical conditions, including: ?Tobacco use. ?Pregnancy. ?Having a hiatal hernia. ?Alcohol use. ?Certain foods and beverages, such as coffee, chocolate, onions, and peppermint. ?What increases the risk? ?You are more likely to develop this condition if you: ?Have an increased body weight. ?Have a connective tissue disorder. ?Take NSAIDs, such as ibuprofen. ?What are the signs or  symptoms? ?Symptoms of this condition include: ?Heartburn. ?Difficult or painful swallowing and the feeling of having a lump in the throat. ?A bitter taste in the mouth. ?Bad breath and having a large amount of saliva. ?Having an upset or bloated stomach and belching. ?Chest pain. Different conditions can cause chest pain. Make sure you see your health care provider if you experience chest pain. ?Shortness of breath or wheezing. ?Ongoing (chronic) cough or a nighttime cough. ?Wearing away of tooth enamel. ?Weight loss. ?How is this diagnosed? ?This condition may be diagnosed based on a medical history and a physical exam. To determine if you have mild or severe GERD, your health care provider may also monitor how you respond to treatment. You may also have tests, including: ?A test to examine your stomach and esophagus with a small camera (endoscopy). ?A test that measures the acidity level in your esophagus. ?A test that measures how much pressure is on your esophagus. ?A barium swallow or modified barium swallow test to show the shape, size, and functioning of your esophagus. ?How is this treated? ?Treatment for this condition may vary depending on how severe your symptoms are. Your health care provider may recommend: ?Changes to your diet. ?Medicine. ?Surgery. ?The goal of treatment is to help relieve your symptoms and to prevent complications. ?Follow these instructions at home: ?Eating and drinking ? ?Follow a diet as recommended by your health care provider. This may involve avoiding foods and drinks such as: ?Coffee and tea, with or without caffeine. ?Drinks that contain alcohol. ?Energy drinks and sports drinks. ?Carbonated drinks or sodas. ?Chocolate and cocoa. ?Peppermint and mint flavorings. ?Garlic and onions. ?Horseradish. ?Spicy and acidic foods, including  peppers, chili powder, curry powder, vinegar, hot sauces, and barbecue sauce. ?Citrus fruit juices and citrus fruits, such as oranges, lemons, and  limes. ?Tomato-based foods, such as red sauce, chili, salsa, and pizza with red sauce. ?Fried and fatty foods, such as donuts, french fries, potato chips, and high-fat dressings. ?High-fat meats, such as hot dogs and fatty cuts of red and white meats, such as rib eye steak, sausage, ham, and bacon. ?High-fat dairy items, such as whole milk, butter, and cream cheese. ?Eat small, frequent meals instead of large meals. ?Avoid drinking large amounts of liquid with your meals. ?Avoid eating meals during the 2-3 hours before bedtime. ?Avoid lying down right after you eat. ?Do not exercise right after you eat. ?Lifestyle ? ?Do not use any products that contain nicotine or tobacco. These products include cigarettes, chewing tobacco, and vaping devices, such as e-cigarettes. If you need help quitting, ask your health care provider. ?Try to reduce your stress by using methods such as yoga or meditation. If you need help reducing stress, ask your health care provider. ?If you are overweight, reduce your weight to an amount that is healthy for you. Ask your health care provider for guidance about a safe weight loss goal. ?General instructions ?Pay attention to any changes in your symptoms. ?Take over-the-counter and prescription medicines only as told by your health care provider. Do not take aspirin, ibuprofen, or other NSAIDs unless your health care provider told you to take these medicines. ?Wear loose-fitting clothing. Do not wear anything tight around your waist that causes pressure on your abdomen. ?Raise (elevate) the head of your bed about 6 inches (15 cm). You can use a wedge to do this. ?Avoid bending over if this makes your symptoms worse. ?Keep all follow-up visits. This is important. ?Contact a health care provider if: ?You have: ?New symptoms. ?Unexplained weight loss. ?Difficulty swallowing or it hurts to swallow. ?Wheezing or a persistent cough. ?A hoarse voice. ?Your symptoms do not improve with treatment. ?Get  help right away if: ?You have sudden pain in your arms, neck, jaw, teeth, or back. ?You suddenly feel sweaty, dizzy, or light-headed. ?You have chest pain or shortness of breath. ?You vomit and the vomit is green, yellow, or black, or it looks like blood or coffee grounds. ?You faint. ?You have stool that is red, bloody, or black. ?You cannot swallow, drink, or eat. ?These symptoms may represent a serious problem that is an emergency. Do not wait to see if the symptoms will go away. Get medical help right away. Call your local emergency services (911 in the [place] Do not drive yourself to the hospital. ?Summary ?Gastroesophageal reflux happens when acid from the stomach flows up into the esophagus. GERD is a disease in which the reflux happens often, causes frequent or severe symptoms, or causes problems such as damage to the esophagus. ?Treatment for this condition may vary depending on how severe your symptoms are. Your health care provider may recommend diet and lifestyle changes, medicine, or surgery. ?Contact a health care provider if you have new or worsening symptoms. ?Take over-the-counter and prescription medicines only as told by your health care provider. Do not take aspirin, ibuprofen, or other NSAIDs unless your health care provider told you to do so. ?Keep all follow-up visits as told by your health care provider. This is important. ?This information is not intended to replace advice given to you by your health care provider. Make sure you discuss any questions you have with your health  care provider. ?Document Revised: 08/14/2019 Document Reviewed: 08/14/2019 ?Elsevier Patient Education ? [place]. ? ?

## 2021-05-29 NOTE — Progress Notes (Signed)
DLCO/N2 performed today. Patient had cough that made spirometry challenging. Patient was not able to keep frequency in able to perform Pleth. ?

## 2021-07-23 ENCOUNTER — Encounter: Payer: Self-pay | Admitting: Adult Health

## 2021-07-23 ENCOUNTER — Ambulatory Visit (INDEPENDENT_AMBULATORY_CARE_PROVIDER_SITE_OTHER): Payer: Medicare Other | Admitting: Adult Health

## 2021-07-23 VITALS — BP 110/70 | HR 55 | Temp 98.0°F | Ht 70.0 in | Wt 198.0 lb

## 2021-07-23 DIAGNOSIS — L820 Inflamed seborrheic keratosis: Secondary | ICD-10-CM | POA: Diagnosis not present

## 2021-07-23 DIAGNOSIS — T148XXA Other injury of unspecified body region, initial encounter: Secondary | ICD-10-CM | POA: Diagnosis not present

## 2021-07-23 DIAGNOSIS — L821 Other seborrheic keratosis: Secondary | ICD-10-CM

## 2021-07-23 MED ORDER — METHYLPREDNISOLONE 4 MG PO TBPK: ORAL_TABLET | ORAL | 0 refills | Status: DC

## 2021-07-23 MED ORDER — METHOCARBAMOL 750 MG PO TABS: 750.0000 mg | ORAL_TABLET | Freq: Three times a day (TID) | ORAL | 0 refills | Status: AC

## 2021-07-23 NOTE — Progress Notes (Addendum)
Subjective:    Patient ID: Dean Thompson, male    DOB: [birth year], 71 y.o.   MRN: [id number]  HPI 71 year old male who  has a past medical history of Allergy, Arthritis, GERD (gastroesophageal reflux disease), Hyperlipidemia, and Rotator cuff tear.  He presents to the office today for an acute issue of left sided neck pain.  He reports a "stabbing" pain that happens intermittently about 3-4 times a day.  This pain starts in his left upper back and radiates up into the neck and occipital area.  Discomfort has been present for roughly 3 weeks.  Denies pain or aggravating injury.  He has not been using anything at home.  Denies headaches or blurred vision.  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  He would like these removed today.   Review of Systems See HPI   Past Medical History:  Diagnosis Date   Allergy    Arthritis    GERD (gastroesophageal reflux disease)    Hyperlipidemia    Rotator cuff tear    left    Social History   Socioeconomic History   Marital status: Married    Spouse name: Not on file   Number of children: Not on file   Years of education: Not on file   Highest education level: Master's degree (e.g., MA, MS, MEng, MEd, MSW, MBA)  Occupational History   Not on file  Tobacco Use   Smoking status: Never   Smokeless tobacco: Never  Vaping Use   Vaping Use: Never used  Substance and Sexual Activity   Alcohol use: Not Currently    Comment: rarely   Drug use: Never   Sexual activity: Yes  Other Topics Concern   Not on file  Social History Narrative   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       Social Determinants of Health   Financial Resource Strain: Low Risk    Difficulty of Paying Living Expenses: Not hard at all  Food Insecurity: No Food Insecurity   Worried About Charity fundraiser in  the Last Year: Never true   [place] in the Last Year: Never true  Transportation Needs: No Transportation Needs   Lack of Transportation (Medical): No   Lack of Transportation (Non-Medical): No  Physical Activity: Sufficiently Active   Days of Exercise per Week: 3 days   Minutes of Exercise per Session: 90 min  Stress: No Stress Concern Present   Feeling of Stress : Not at all  Social Connections: Socially Integrated   Frequency of Communication with Friends and Family: Twice a week   Frequency of Social Gatherings with Friends and Family: Twice a week   Attends Religious Services: More than 4 times per year   Active Member of [employer] or Organizations: Yes   Attends Archivist Meetings: More than 4 times per year   Marital Status: Married  Human resources officer Violence: Not on file    Past Surgical History:  Procedure Laterality Date   COLONOSCOPY  2004   in [place] and was normal per pt   CYSTECTOMY  2006   subacious cyst  remove from neck   EXCISION METACARPAL MASS Right 05/21/2020   Procedure: EXCISION MUCOID CYST WITH DISTAL INTERPHALANGEAL JOINT ARTHROTOMY RIGHT  MIDDLE FINGER;  Surgeon: Daryll Brod, MD;  Location: [place];  Service: Orthopedics;  Laterality: Right;   ROTATOR CUFF REPAIR Right 1999   SEPTOPLASTY  1982   SHOULDER OPEN ROTATOR CUFF REPAIR  10/01/2011   Procedure: ROTATOR CUFF REPAIR SHOULDER OPEN;  Surgeon: Magnus Sinning, MD;  Location: [facility];  Service: Orthopedics;  Laterality: Left;  Left Shoulder Acrominectomy/Open Rotator Cuff Repair/Distal Clavicle Resection   SPINE SURGERY  2018    Family History  Adopted: Yes  Problem Relation Age of Onset   Heart disease Father    Colon cancer Neg Hx    Colon polyps Neg Hx    Rectal cancer Neg Hx    Stomach cancer Neg Hx    Asthma Neg Hx    Allergic rhinitis Neg Hx    Immunodeficiency Neg Hx    Eczema Neg Hx    Urticaria Neg Hx    Angioedema Neg Hx     Allergies  Allergen  Reactions   Wasp Venom Anaphylaxis   Citrus Other (See Comments)    "nasal congestion" "nasal congestion"   Other     Spicy foods and tomato based foods cause a cough, uses Loratadine with relief    Current Outpatient Medications on File Prior to Visit  Medication Sig Dispense Refill   EPINEPHrine 0.3 mg/0.3 mL IJ SOAJ injection Inject 0.3 mLs (0.3 mg total) into the muscle as needed for anaphylaxis. 2 each 1   Multiple Vitamin (MULTIVITAMIN WITH MINERALS) TABS Take 1 tablet by mouth every morning.     omeprazole (PRILOSEC) 40 MG capsule Take 1 capsule (40 mg total) by mouth daily. (Patient taking differently: Take 40 mg by mouth 3 (three) times a week.) 30 capsule 3   No current facility-administered medications on file prior to visit.    BP 110/70   Pulse (!) 55   Temp 98 F (36.7 C) (Oral)   Ht '5\' 10"'$  (1.778 m)   Wt 198 lb (89.8 kg)   SpO2 97%   BMI 28.41 kg/m       Objective:   Physical Exam Vitals and nursing note reviewed.  Constitutional:      Appearance: Normal appearance.  Musculoskeletal:        General: Normal range of motion.       Back:  Skin:    General: Skin is warm and dry.       Neurological:     Mental Status: He is alert.        Assessment & Plan:  1. Muscle strain -Signs and symptoms consistent with muscle strain.  Will prescribe Medrol Dosepak and Robaxin.  Advise follow-up if no improvement over the next 2 to 3 days - methylPREDNISolone (MEDROL DOSEPAK) 4 MG TBPK tablet; Take as directed  Dispense: 21 tablet; Refill: 0 - methocarbamol (ROBAXIN-750) 750 MG tablet; Take 1 tablet (750 mg total) by mouth 3 (three) times daily for 7 days.  Dispense: 21 tablet; Refill: 0  2. Inflamed Seborrheic keratosis -Discussed removal options including cryotherapy or shaving the lesions off.  He opted for cryotherapy.  Using 3 freeze thaw cycles 5 total seborrheic keratosis were frozen.  He tolerated the procedure well.  Aftercare instructions  reviewed.  [name], NP

## 2021-07-31 DIAGNOSIS — R053 Chronic cough: Secondary | ICD-10-CM | POA: Diagnosis not present

## 2021-07-31 DIAGNOSIS — K2100 Gastro-esophageal reflux disease with esophagitis, without bleeding: Secondary | ICD-10-CM | POA: Diagnosis not present

## 2021-10-01 ENCOUNTER — Ambulatory Visit (INDEPENDENT_AMBULATORY_CARE_PROVIDER_SITE_OTHER): Payer: Medicare Other | Admitting: Adult Health

## 2021-10-01 ENCOUNTER — Encounter: Payer: Self-pay | Admitting: Adult Health

## 2021-10-01 VITALS — BP 110/60 | HR 61 | Temp 97.7°F | Ht 70.0 in | Wt 203.0 lb

## 2021-10-01 DIAGNOSIS — R233 Spontaneous ecchymoses: Secondary | ICD-10-CM

## 2021-10-01 LAB — CBC WITH DIFFERENTIAL/PLATELET
Basophils Absolute: 0 10*3/uL (ref 0.0–0.1)
Basophils Relative: 0.2 % (ref 0.0–3.0)
Eosinophils Absolute: 0.1 10*3/uL (ref 0.0–0.7)
Eosinophils Relative: 2.2 % (ref 0.0–5.0)
HCT: 46.9 % (ref 39.0–52.0)
Hemoglobin: 15.6 g/dL (ref 13.0–17.0)
Lymphocytes Relative: 20.6 % (ref 12.0–46.0)
Lymphs Abs: 1.3 10*3/uL (ref 0.7–4.0)
MCHC: 33.4 g/dL (ref 30.0–36.0)
MCV: 92.2 fl (ref 78.0–100.0)
Monocytes Absolute: 0.5 10*3/uL (ref 0.1–1.0)
Monocytes Relative: 7.5 % (ref 3.0–12.0)
Neutro Abs: 4.3 10*3/uL (ref 1.4–7.7)
Neutrophils Relative %: 69.5 % (ref 43.0–77.0)
Platelets: 133 10*3/uL — ABNORMAL LOW (ref 150.0–400.0)
RBC: 5.08 Mil/uL (ref 4.22–5.81)
RDW: 13.8 % (ref 11.5–15.5)
WBC: 6.1 10*3/uL (ref 4.0–10.5)

## 2021-10-01 LAB — PROTIME-INR
INR: 1 ratio (ref 0.8–1.0)
Prothrombin Time: 11.5 s (ref 9.6–13.1)

## 2021-10-01 LAB — COMPREHENSIVE METABOLIC PANEL
ALT: 22 U/L (ref 0–53)
AST: 20 U/L (ref 0–37)
Albumin: 4.4 g/dL (ref 3.5–5.2)
Alkaline Phosphatase: 93 U/L (ref 39–117)
BUN: 17 mg/dL (ref 6–23)
CO2: 28 mEq/L (ref 19–32)
Calcium: 9.4 mg/dL (ref 8.4–10.5)
Chloride: 105 mEq/L (ref 96–112)
Creatinine, Ser: 1.23 mg/dL (ref 0.40–1.50)
GFR: 59.14 mL/min — ABNORMAL LOW (ref >60.00)
Glucose, Bld: 89 mg/dL (ref 70–99)
Potassium: 4.6 mEq/L (ref 3.5–5.1)
Sodium: 141 mEq/L (ref 135–145)
Total Bilirubin: 1.3 mg/dL — ABNORMAL HIGH (ref 0.2–1.2)
Total Protein: 7.1 g/dL (ref 6.0–8.3)

## 2021-10-01 NOTE — Progress Notes (Signed)
Subjective:    Patient ID: ZAIDEN LUDLUM, male    DOB: 12/15/1950, 71 y.o.   MRN: [id number]  HPI 71 year old male who  has a past medical history of Allergy, Arthritis, GERD (gastroesophageal reflux disease), Hyperlipidemia, and Rotator cuff tear.  He presents to the office today to for concern of bruising to his right forearm. He reports that three times over the last year he has had a bruise pop up on his right forearm in the same place. He denies trauma or injury to his area. It does not hurt and he reports that it usually goes away without a week He does not take a daily ASA but he does take what sounds like a lot of vitamins and supplements including MVI's high in Vitamin E  Looking back on his labs he has had a stable but slightly mild thrombocytopenia   Review of Systems See HPI   Past Medical History:  Diagnosis Date   Allergy    Arthritis    GERD (gastroesophageal reflux disease)    Hyperlipidemia    Rotator cuff tear    left    Social History   Socioeconomic History   Marital status: Married    Spouse name: Not on file   Number of children: Not on file   Years of education: Not on file   Highest education level: Master's degree (e.g., MA, MS, MEng, MEd, MSW, MBA)  Occupational History   Not on file  Tobacco Use   Smoking status: Never   Smokeless tobacco: Never  Vaping Use   Vaping Use: Never used  Substance and Sexual Activity   Alcohol use: Not Currently    Comment: rarely   Drug use: Never   Sexual activity: Yes  Other Topics Concern   Not on file  Social History Narrative   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       Social Determinants of Health   Financial Resource Strain: Low Risk  (04/28/2021)   Overall Financial Resource Strain (CARDIA)    Difficulty of Paying Living Expenses: Not hard at all  Food Insecurity: No Food Insecurity (04/28/2021)    Hunger Vital Sign    Worried About Running Out of Food in the Last Year: Never true    Ran Out of Food in the Last Year: Never true  Transportation Needs: No Transportation Needs (04/28/2021)   PRAPARE - Hydrologist (Medical): No    Lack of Transportation (Non-Medical): No  Physical Activity: Sufficiently Active (12/31/2020)   Exercise Vital Sign    Days of Exercise per Week: 3 days    Minutes of Exercise per Session: 90 min  Stress: No Stress Concern Present (04/28/2021)   [place]    Feeling of Stress : Not at all  Social Connections: [place] (04/28/2021)   Social Connection and Isolation Panel [NHANES]    Frequency of Communication with Friends and Family: Twice a week    Frequency of Social Gatherings with Friends and Family: Twice a week    Attends Religious Services: More than 4 times per year    Active Member of [employer] or Organizations: Yes    Attends Music therapist: More than 4 times per year    Marital Status: Married  Intimate Partner Violence: Not At Risk (12/28/2019)   Humiliation, Afraid, Rape, and Kick questionnaire    Fear of Current or Ex-Partner: No    Emotionally Abused: No    Physically Abused: No    Sexually Abused: No    Past Surgical History:  Procedure Laterality Date   COLONOSCOPY  2004   in [place] and was normal per pt   CYSTECTOMY  2006   subacious cyst  remove from neck   EXCISION METACARPAL MASS Right 05/21/2020   Procedure: EXCISION MUCOID CYST WITH DISTAL INTERPHALANGEAL JOINT ARTHROTOMY RIGHT MIDDLE FINGER;  Surgeon: Daryll Brod, MD;  Location: [facility];  Service: Orthopedics;  Laterality: Right;   ROTATOR CUFF REPAIR Right 1999   SEPTOPLASTY  1982   SHOULDER OPEN ROTATOR CUFF REPAIR  10/01/2011   Procedure: ROTATOR CUFF REPAIR SHOULDER OPEN;  Surgeon: Magnus Sinning, MD;  Location: [facility];  Service:  Orthopedics;  Laterality: Left;  Left Shoulder Acrominectomy/Open Rotator Cuff Repair/Distal Clavicle Resection   SPINE SURGERY  2018    Family History  Adopted: Yes  Problem Relation Age of Onset   Heart disease Father    Colon cancer Neg Hx    Colon polyps Neg Hx    Rectal cancer Neg Hx    Stomach cancer Neg Hx    Asthma Neg Hx    Allergic rhinitis Neg Hx    Immunodeficiency Neg Hx    Eczema Neg Hx    Urticaria Neg Hx    Angioedema Neg Hx     Allergies  Allergen Reactions   Wasp Venom Anaphylaxis   Citrus Other (See Comments)    "nasal congestion" "nasal congestion"   Other     Spicy foods and tomato based foods cause a cough, uses Loratadine with relief    Current Outpatient Medications on File Prior to Visit  Medication Sig Dispense Refill   cetirizine (ZYRTEC) 10 MG tablet Take 1 tablet by mouth daily.     EPINEPHrine 0.3 mg/0.3 mL IJ SOAJ injection Inject 0.3 mLs (0.3 mg total) into the muscle as needed for anaphylaxis. 2 each 1   Multiple Vitamin (MULTIVITAMIN WITH MINERALS) TABS Take 1 tablet by mouth every morning.     omeprazole (PRILOSEC) 40 MG capsule Take 1 capsule (40 mg total) by mouth daily. (Patient taking differently: Take 40 mg by mouth 3 (three) times a week.) 30 capsule 3   No current facility-administered medications on file prior to visit.    BP 110/60   Pulse 61   Temp 97.7 F (36.5 C) (Oral)   Ht '5\' 10"'$  (1.778 m)   Wt 203 lb (92.1 kg)   SpO2 98%   BMI 29.13 kg/m       Objective:   Physical Exam Vitals and nursing note reviewed.  Constitutional:      Appearance: Normal appearance.  Musculoskeletal:        General: Normal range of motion.  Skin:    Capillary Refill: Capillary refill takes less than 2 seconds.     Findings: Bruising (healing silver dollar sized bruise on right forearm) present.  Neurological:     General: No focal deficit present.     Mental Status: He is alert and oriented to person, place, and time.   Psychiatric:        Mood and Affect: Mood normal.        Behavior: Behavior normal.        Thought Content: Thought content normal.  Assessment & Plan:  1. Easy bruising - will check labs at his request  - he will get me a list of supplements  - CBC with Differential/Platelet; Future - Protime-INR; Future - APTT; Future - Vitamin E; Future - Comprehensive metabolic panel; Future - Comprehensive metabolic panel - Vitamin E - APTT - Protime-INR - CBC with Differential/Platelet  [name], NP

## 2021-10-02 LAB — APTT: aPTT: 29.3 s (ref 25.4–36.8)

## 2021-10-03 ENCOUNTER — Encounter: Payer: Self-pay | Admitting: Adult Health

## 2021-10-07 LAB — VITAMIN E
Gamma-Tocopherol (Vit E): <1 mg/L (ref <=4.3)
Vitamin E (Alpha Tocopherol): 13.2 mg/L (ref 5.7–19.9)

## 2021-10-29 DIAGNOSIS — L570 Actinic keratosis: Secondary | ICD-10-CM | POA: Diagnosis not present

## 2021-10-29 DIAGNOSIS — D692 Other nonthrombocytopenic purpura: Secondary | ICD-10-CM | POA: Diagnosis not present

## 2021-11-28 DIAGNOSIS — L814 Other melanin hyperpigmentation: Secondary | ICD-10-CM | POA: Diagnosis not present

## 2021-11-28 DIAGNOSIS — D692 Other nonthrombocytopenic purpura: Secondary | ICD-10-CM | POA: Diagnosis not present

## 2021-11-28 DIAGNOSIS — D1801 Hemangioma of skin and subcutaneous tissue: Secondary | ICD-10-CM | POA: Diagnosis not present

## 2021-11-28 DIAGNOSIS — D485 Neoplasm of uncertain behavior of skin: Secondary | ICD-10-CM | POA: Diagnosis not present

## 2021-11-28 DIAGNOSIS — L309 Dermatitis, unspecified: Secondary | ICD-10-CM | POA: Diagnosis not present

## 2021-11-28 DIAGNOSIS — L570 Actinic keratosis: Secondary | ICD-10-CM | POA: Diagnosis not present

## 2021-11-28 DIAGNOSIS — C44619 Basal cell carcinoma of skin of left upper limb, including shoulder: Secondary | ICD-10-CM | POA: Diagnosis not present

## 2021-11-28 DIAGNOSIS — L821 Other seborrheic keratosis: Secondary | ICD-10-CM | POA: Diagnosis not present

## 2022-01-06 ENCOUNTER — Ambulatory Visit: Payer: Medicare Other

## 2022-01-13 DIAGNOSIS — C44619 Basal cell carcinoma of skin of left upper limb, including shoulder: Secondary | ICD-10-CM | POA: Diagnosis not present

## 2022-01-13 DIAGNOSIS — L989 Disorder of the skin and subcutaneous tissue, unspecified: Secondary | ICD-10-CM | POA: Diagnosis not present

## 2022-01-22 ENCOUNTER — Telehealth (INDEPENDENT_AMBULATORY_CARE_PROVIDER_SITE_OTHER): Payer: Medicare Other

## 2022-01-22 VITALS — Ht 70.0 in | Wt 203.0 lb

## 2022-01-22 DIAGNOSIS — Z0000 Encounter for general adult medical examination without abnormal findings: Secondary | ICD-10-CM | POA: Diagnosis not present

## 2022-01-22 NOTE — Patient Instructions (Addendum)
[name] , Thank you for taking time to come for your Medicare Wellness Visit. I appreciate your ongoing commitment to your health goals. Please review the following plan we discussed and let me know if I can assist you in the future.   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  Completed   HPV Vaccine  Aged Out  *Topic was postponed. The date shown is not the original due date.    Advanced directives: Advance directive discussed with you today. Even though you declined this today, please call our office should you change your mind, and we can give you the proper paperwork for you to fill out.   Conditions/risks identified: None  Next appointment: Follow up in one year for your annual wellness visit.    Preventive Care 21 Years and Older, Male  Preventive care refers to lifestyle choices and visits with your health care provider that can promote health and wellness. What does preventive care include? A yearly physical exam. This is also called an annual well check. Dental exams once or twice a year. Routine eye exams. Ask your health care provider how often you should have your eyes checked. Personal lifestyle choices, including: Daily care of your teeth and gums. Regular physical  activity. Eating a healthy diet. Avoiding tobacco and drug use. Limiting alcohol use. Practicing safe sex. Taking low doses of aspirin every day. Taking vitamin and mineral supplements as recommended by your health care provider. What happens during an annual well check? The services and screenings done by your health care provider during your annual well check will depend on your age, overall health, lifestyle risk factors, and family history of disease. Counseling  Your health care provider may ask you questions about your: Alcohol use. Tobacco use. Drug use. Emotional well-being. Home and relationship well-being. Sexual activity. Eating habits. History of falls. Memory and ability to understand (cognition). Work and work Statistician. Screening  You may have the following tests or measurements: Height, weight, and BMI. Blood pressure. Lipid and cholesterol levels. These may be checked every 5 years, or more frequently if you are over 54 years old. Skin check. Lung cancer screening. You may have this screening every year starting at age 31 if you have a 30-pack-year history of smoking and currently smoke or have quit within the past 15 years. Fecal occult blood test (FOBT) of the stool. You may have this test every year starting at age 65. Flexible sigmoidoscopy or colonoscopy. You may have a sigmoidoscopy every 5 years or a colonoscopy every 10 years starting at age 32. Prostate cancer screening.  Recommendations will vary depending on your family history and other risks. Hepatitis C blood test. Hepatitis B blood test. Sexually transmitted disease (STD) testing. Diabetes screening. This is done by checking your blood sugar (glucose) after you have not eaten for a while (fasting). You may have this done every 1-3 years. Abdominal aortic aneurysm (AAA) screening. You may need this if you are a current or former smoker. Osteoporosis. You may be screened starting at age 91 if you are  at high risk. Talk with your health care provider about your test results, treatment options, and if necessary, the need for more tests. Vaccines  Your health care provider may recommend certain vaccines, such as: Influenza vaccine. This is recommended every year. Tetanus, diphtheria, and acellular pertussis (Tdap, Td) vaccine. You may need a Td booster every 10 years. Zoster vaccine. You may need this after age 31. Pneumococcal 13-valent conjugate (PCV13) vaccine. One dose is recommended after age 47. Pneumococcal polysaccharide (PPSV23) vaccine. One dose is recommended after age 46. Talk to your health care provider about which screenings and vaccines you need and how often you need them. This information is not intended to replace advice given to you by your health care provider. Make sure you discuss any questions you have with your health care provider. Document Released: 03/01/2015 Document Revised: 10/23/2015 Document Reviewed: 12/04/2014 Elsevier Interactive Patient Education  2017 [employer] Prevention in the Home Falls can cause injuries. They can happen to people of all ages. There are many things you can do to make your home safe and to help prevent falls. What can I do on the outside of my home? Regularly fix the edges of walkways and driveways and fix any cracks. Remove anything that might make you trip as you walk through a door, such as a raised step or threshold. Trim any bushes or trees on the path to your home. Use bright outdoor lighting. Clear any walking paths of anything that might make someone trip, such as rocks or tools. Regularly check to see if handrails are loose or broken. Make sure that both sides of any steps have handrails. Any raised decks and porches should have guardrails on the edges. Have any leaves, snow, or ice cleared regularly. Use sand or salt on walking paths during winter. Clean up any spills in your garage right away. This includes oil  or grease spills. What can I do in the bathroom? Use night lights. Install grab bars by the toilet and in the tub and shower. Do not use towel bars as grab bars. Use non-skid mats or decals in the tub or shower. If you need to sit down in the shower, use a plastic, non-slip stool. Keep the floor dry. Clean up any water that spills on the floor as soon as it happens. Remove soap buildup in the tub or shower regularly. Attach bath mats securely with double-sided non-slip rug tape. Do not have throw rugs and other things on the floor that can make you trip. What can I do in the bedroom? Use night lights. Make sure that you have a light by your bed that is easy to reach. Do not use any sheets or blankets that are too big for your bed. They should not hang down onto the floor. Have a firm chair that has side arms. You can use this for support while you get dressed. Do not have throw rugs and other things on the floor that can make you trip. What can I  do in the kitchen? Clean up any spills right away. Avoid walking on wet floors. Keep items that you use a lot in easy-to-reach places. If you need to reach something above you, use a strong step stool that has a grab bar. Keep electrical cords out of the way. Do not use floor polish or wax that makes floors slippery. If you must use wax, use non-skid floor wax. Do not have throw rugs and other things on the floor that can make you trip. What can I do with my stairs? Do not leave any items on the stairs. Make sure that there are handrails on both sides of the stairs and use them. Fix handrails that are broken or loose. Make sure that handrails are as long as the stairways. Check any carpeting to make sure that it is firmly attached to the stairs. Fix any carpet that is loose or worn. Avoid having throw rugs at the top or bottom of the stairs. If you do have throw rugs, attach them to the floor with carpet tape. Make sure that you have a light  switch at the top of the stairs and the bottom of the stairs. If you do not have them, ask someone to add them for you. What else can I do to help prevent falls? Wear shoes that: Do not have high heels. Have rubber bottoms. Are comfortable and fit you well. Are closed at the toe. Do not wear sandals. If you use a stepladder: Make sure that it is fully opened. Do not climb a closed stepladder. Make sure that both sides of the stepladder are locked into place. Ask someone to hold it for you, if possible. Clearly mark and make sure that you can see: Any grab bars or handrails. First and last steps. Where the edge of each step is. Use tools that help you move around (mobility aids) if they are needed. These include: Canes. Walkers. Scooters. Crutches. Turn on the lights when you go into a dark area. Replace any light bulbs as soon as they burn out. Set up your furniture so you have a clear path. Avoid moving your furniture around. If any of your floors are uneven, fix them. If there are any pets around you, be aware of where they are. Review your medicines with your doctor. Some medicines can make you feel dizzy. This can increase your chance of falling. Ask your doctor what other things that you can do to help prevent falls. This information is not intended to replace advice given to you by your health care provider. Make sure you discuss any questions you have with your health care provider. Document Released: 11/29/2008 Document Revised: 07/11/2015 Document Reviewed: 03/09/2014 Elsevier Interactive Patient Education  2017 [employer].

## 2022-01-22 NOTE — Progress Notes (Signed)
Subjective:   Dean Thompson is a 71 y.o. male who presents for Medicare Annual/Subsequent preventive examination.  Review of Systems    Virtual Visit via Telephone Note  I connected with  Dean Thompson on 01/22/22 at  9:15 AM EST by telephone and verified that I am speaking with the correct person using two identifiers.  Location: Patient: Home Provider: Office Persons participating in the virtual visit: patient/Nurse Health Advisor   I discussed the limitations, risks, security and privacy concerns of performing an evaluation and management service by telephone and the availability of in person appointments. The patient expressed understanding and agreed to proceed.  Interactive audio and video telecommunications were attempted between this nurse and patient, however failed, due to patient having technical difficulties OR patient did not have access to video capability.  We continued and completed visit with audio only.  Some vital signs may be absent or patient reported.   Criselda Peaches, LPN  Cardiac Risk Factors include: advanced age (>64mn, >>52women);male gender     Objective:    Today's Vitals   01/22/22 0918  Weight: 203 lb (92.1 kg)  Height: '5\' 10"'$  (1.778 m)   Body mass index is 29.13 kg/m.     01/22/2022    9:25 AM 12/31/2020    8:36 AM 05/21/2020    7:10 AM 05/13/2020   10:50 AM 12/28/2019    8:34 AM 08/30/2017    7:07 AM 11/24/2016    7:14 PM  Advanced Directives  Does Patient Have a Medical Advance Directive? No No No No No No No  Would patient like information on creating a medical advance directive? No - Patient declined  No - Patient declined  No - Patient declined Yes (MAU/Ambulatory/Procedural Areas - Information given)     Current Medications (verified) Outpatient Encounter Medications as of 01/22/2022  Medication Sig   EPINEPHrine 0.3 mg/0.3 mL IJ SOAJ injection Inject 0.3 mLs (0.3 mg total) into the muscle as needed for anaphylaxis.    Multiple Vitamin (MULTIVITAMIN WITH MINERALS) TABS Take 1 tablet by mouth every morning.   [DISCONTINUED] cetirizine (ZYRTEC) 10 MG tablet Take 1 tablet by mouth daily.   [DISCONTINUED] omeprazole (PRILOSEC) 40 MG capsule Take 1 capsule (40 mg total) by mouth daily. (Patient taking differently: Take 40 mg by mouth 3 (three) times a week.)   No facility-administered encounter medications on file as of 01/22/2022.    Allergies (verified) Wasp venom, Citrus, and Other   History: Past Medical History:  Diagnosis Date   Allergy    Arthritis    GERD (gastroesophageal reflux disease)    Hyperlipidemia    Rotator cuff tear    left   Past Surgical History:  Procedure Laterality Date   [place]   CYSTECTOMY  2006   subacious cyst  remove from neck   EXCISION METACARPAL MASS Right 05/21/2020   Procedure: EXCISION MUCOID CYST WITH DISTAL INTERPHALANGEAL JOINT ARTHROTOMY RIGHT MIDDLE FINGER;  Surgeon: KDaryll Brod MD;  [facility]  Service: Orthopedics;  Laterality: Right;   ROTATOR CUFF REPAIR Right 1999   SEPTOPLASTY  1982   SHOULDER OPEN ROTATOR CUFF REPAIR  10/01/2011   Procedure: ROTATOR CUFF REPAIR SHOULDER OPEN;  Surgeon: JMagnus Sinning MD;  Location: [facility];  Service: Orthopedics;  Laterality: Left;  Left Shoulder Acrominectomy/Open Rotator Cuff Repair/Distal Clavicle Resection   SPINE SURGERY  2018   Family History  Adopted: Yes  Problem Relation Age of Onset   Heart disease Father    Colon cancer Neg Hx    Colon polyps Neg Hx    Rectal cancer Neg Hx    Stomach cancer Neg Hx    Asthma Neg Hx    Allergic rhinitis Neg Hx    Immunodeficiency Neg Hx    Eczema Neg Hx    Urticaria Neg Hx    Angioedema Neg Hx    Social History   Socioeconomic History   Marital status: Married    Spouse name: Not on file   Number of children: Not on file   Years of education: Not on file   Highest education level:  Master's degree (e.g., MA, MS, MEng, MEd, MSW, MBA)  Occupational History   Not on file  Tobacco Use   Smoking status: Never   Smokeless tobacco: Never  Vaping Use   Vaping Use: Never used  Substance and Sexual Activity   Alcohol use: Not Currently    Comment: rarely   Drug use: Never   Sexual activity: Yes  Other Topics Concern   Not on file  Social History Narrative   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       Social Determinants of Health   Financial Resource Strain: Low Risk  (01/22/2022)   Overall Financial Resource Strain (CARDIA)    Difficulty of Paying Living Expenses: Not hard at all  Food Insecurity: No Food Insecurity (01/22/2022)   Hunger Vital Sign    Worried About Running Out of Food in the Last Year: Never true    Ran Out of Food in the Last Year: Never true  Transportation Needs: No Transportation Needs (01/22/2022)   PRAPARE - Hydrologist (Medical): No    Lack of Transportation (Non-Medical): No  Physical Activity: Sufficiently Active (01/22/2022)   Exercise Vital Sign    Days of Exercise per Week: 3 days    Minutes of Exercise per Session: 60 min  Stress: No Stress Concern Present (01/22/2022)   [place]    Feeling of Stress : Not at all  Social Connections: [place] (01/22/2022)   Social Connection and Isolation Panel [NHANES]    Frequency of Communication with Friends and Family: More than three times a week    Frequency of Social Gatherings with Friends and Family: More than three times a week    Attends Religious Services: More than 4 times per year    Active Member of [employer] or Organizations: Yes    Attends Music therapist: More than 4 times per year    Marital Status: Married    Tobacco Counseling Counseling given: Not  Answered   Clinical Intake:  Pre-visit preparation completed: No  Pain : No/denies pain     BMI - recorded: 29.13 Nutritional Status: BMI 25 -29 Overweight Nutritional Risks: None Diabetes: No  How often do you need to have someone help you when you read instructions, pamphlets, or other written materials from your doctor or pharmacy?: 1 - Never  Diabetic? No  Interpreter Needed?: No  Information entered by :: Rolene Arbour LPN   Activities of Daily Living    01/22/2022    9:24 AM  In your present state of health, do you have  any difficulty performing the following activities:  Hearing? 0  Vision? 0  Difficulty concentrating or making decisions? 0  Walking or climbing stairs? 0  Dressing or bathing? 0  Doing errands, shopping? 0  Preparing Food and eating ? N  Using the Toilet? N  In the past six months, have you accidently leaked urine? N  Do you have problems with loss of bowel control? N  Managing your Medications? N  Managing your Finances? N  Housekeeping or managing your Housekeeping? N    Patient Care Team: [name], NP as PCP - General (Family Medicine)  Indicate any recent Medical Services you may have received from other than [facility] providers in the past year (date may be approximate).     Assessment:   This is a routine wellness examination for [place].  Hearing/Vision screen Hearing Screening - Comments:: Denies hearing difficulties   Vision Screening - Comments:: Wears rx glasses - up to date with routine eye exams with  [place] issues and exercise activities discussed: Current Exercise Habits: Home exercise routine, Type of exercise: walking, Time (Minutes): 60, Frequency (Times/Week): 3, Weekly Exercise (Minutes/Week): 180, Intensity: Moderate   Goals Addressed               This Visit's Progress     Patient Stated (pt-stated)        I will continue to exercise 2-3 days per week       Depression  Screen    01/22/2022    9:24 AM 04/28/2021    4:55 PM 01/17/2021    3:36 PM 12/31/2020    8:37 AM 11/06/2020    9:34 AM 12/28/2019    8:36 AM 10/17/2019    7:01 AM  PHQ 2/9 Scores  PHQ - 2 Score 0 0 0 0 0 0 0  PHQ- 9 Score  0 2   0 0    Fall Risk    01/22/2022    9:25 AM 01/19/2022    5:55 PM 04/28/2021    4:55 PM 01/17/2021    3:36 PM 12/31/2020    8:37 AM  [place] in the past year? 0 0 0 0 0  Number falls in past yr: 0  0    Injury with Fall? 0  0    Risk for fall due to : No Fall Risks  No Fall Risks  No Fall Risks  Follow up Falls prevention discussed    Falls evaluation completed;Education provided;Falls prevention discussed    FALL RISK PREVENTION PERTAINING TO THE HOME:  Any stairs in or around the home? No  If so, are there any without handrails? No  Home free of loose throw rugs in walkways, pet beds, electrical cords, etc? Yes  Adequate lighting in your home to reduce risk of falls? Yes   ASSISTIVE DEVICES UTILIZED TO PREVENT FALLS:  Life alert? No  Use of a cane, walker or w/c? No  Grab bars in the bathroom? Yes  Shower chair or bench in shower? No  Elevated toilet seat or a handicapped toilet? No   TIMED UP AND GO:  Was the test performed? No . Audio Visit   Cognitive Function:        01/22/2022    9:25 AM 12/31/2020    8:38 AM  6CIT Screen  What Year? 0 points 0 points  What month? 0 points 0 points  What time? 0 points 0 points  Count back from 20 0  points 0 points  Months in reverse 0 points 0 points  Repeat phrase 0 points 0 points  Total Score 0 points 0 points    Immunizations Immunization History  Administered Date(s) Administered   Moderna Sars-Covid-2 Vaccination 06/17/2019, 07/16/2019   Pneumococcal Conjugate-13 07/29/2015   Pneumococcal Polysaccharide-23 09/01/2017   Tdap 12/15/2011    TDAP status: Due, Education has been provided regarding the importance of this vaccine. Advised may receive this vaccine at local  pharmacy or Health Dept. Aware to provide a copy of the vaccination record if obtained from local pharmacy or Health Dept. Verbalized acceptance and understanding.  Flu Vaccine status: Declined, Education has been provided regarding the importance of this vaccine but patient still declined. Advised may receive this vaccine at local pharmacy or Health Dept. Aware to provide a copy of the vaccination record if obtained from local pharmacy or Health Dept. Verbalized acceptance and understanding.  Pneumococcal vaccine status: Up to date  Covid-19 vaccine status: Completed vaccines  Qualifies for Shingles Vaccine? Yes   Zostavax completed No   Shingrix Completed?: No.    Education has been provided regarding the importance of this vaccine. Patient has been advised to call insurance company to determine out of pocket expense if they have not yet received this vaccine. Advised may also receive vaccine at local pharmacy or Health Dept. Verbalized acceptance and understanding.  Screening Tests Health Maintenance  Topic Date Due   DTaP/Tdap/Td (2 - Td or Tdap) 12/14/2021   COVID-19 Vaccine (3 - Moderna risk series) 02/07/2022 (Originally 08/13/2019)   Zoster Vaccines- Shingrix (1 of 2) 04/23/2022 (Originally 05/29/1969)   INFLUENZA VACCINE  05/17/2022 (Originally 09/16/2021)   Medicare Annual Wellness (AWV)  01/23/2023   COLONOSCOPY (Pts 45-69yr Insurance coverage will need to be confirmed)  03/07/2026   Pneumonia Vaccine 71 Years old  Completed   Hepatitis C Screening  Completed   HPV VACCINES  Aged Out    Health Maintenance  Health Maintenance Due  Topic Date Due   DTaP/Tdap/Td (2 - Td or Tdap) 12/14/2021    Colorectal cancer screening: Type of screening: Colonoscopy. Completed 03/07/21. Repeat every 5 years  Lung Cancer Screening: (Low Dose CT Chest recommended if Age 71-80years, 30 pack-year currently smoking OR have quit w/in 15years.) does not qualify.    Additional  Screening:  Hepatitis C Screening: does qualify; Completed 09/01/17  Vision Screening: Recommended annual ophthalmology exams for early detection of glaucoma and other disorders of the eye. Is the patient up to date with their annual eye exam?  Yes  Who is the provider or what is the name of the office in which the patient attends annual eye exams[facility] is not established with a provider, would they like to be referred to a provider to establish care? No .   Dental Screening: Recommended annual dental exams for proper oral hygiene  Community Resource Referral / Chronic Care Management:  CRR required this visit?  No   CCM required this visit?  No      Plan:     I have personally reviewed and noted the following in the patient's chart:   Medical and social history Use of alcohol, tobacco or illicit drugs  Current medications and supplements including opioid prescriptions. Patient is not currently taking opioid prescriptions. Functional ability and status Nutritional status Physical activity Advanced directives List of other physicians Hospitalizations, surgeries, and ER visits in previous 12 months Vitals Screenings to include cognitive, depression, and falls Referrals  and appointments  In addition, I have reviewed and discussed with patient certain preventive protocols, quality metrics, and best practice recommendations. A written personalized care plan for preventive services as well as general preventive health recommendations were provided to patient.     Criselda Peaches, LPN   29/10/3714   Nurse Notes: Patient due Dtap/Tdap/Td(2-Td or Tdap)

## 2022-01-23 DIAGNOSIS — L570 Actinic keratosis: Secondary | ICD-10-CM | POA: Diagnosis not present

## 2022-01-23 DIAGNOSIS — Z4802 Encounter for removal of sutures: Secondary | ICD-10-CM | POA: Diagnosis not present

## 2022-01-23 DIAGNOSIS — R208 Other disturbances of skin sensation: Secondary | ICD-10-CM | POA: Diagnosis not present

## 2022-01-23 DIAGNOSIS — D225 Melanocytic nevi of trunk: Secondary | ICD-10-CM | POA: Diagnosis not present

## 2022-01-23 DIAGNOSIS — L538 Other specified erythematous conditions: Secondary | ICD-10-CM | POA: Diagnosis not present

## 2022-01-23 DIAGNOSIS — Z789 Other specified health status: Secondary | ICD-10-CM | POA: Diagnosis not present

## 2022-01-23 DIAGNOSIS — L298 Other pruritus: Secondary | ICD-10-CM | POA: Diagnosis not present

## 2022-01-31 DIAGNOSIS — L03012 Cellulitis of left finger: Secondary | ICD-10-CM | POA: Diagnosis not present

## 2022-02-05 ENCOUNTER — Encounter: Payer: Self-pay | Admitting: Adult Health

## 2022-04-01 ENCOUNTER — Ambulatory Visit (INDEPENDENT_AMBULATORY_CARE_PROVIDER_SITE_OTHER): Payer: Medicare Other | Admitting: Gastroenterology

## 2022-04-01 ENCOUNTER — Encounter: Payer: Self-pay | Admitting: Gastroenterology

## 2022-04-01 VITALS — BP 122/74 | HR 74 | Ht 70.0 in | Wt 207.0 lb

## 2022-04-01 DIAGNOSIS — R059 Cough, unspecified: Secondary | ICD-10-CM | POA: Diagnosis not present

## 2022-04-01 DIAGNOSIS — K2100 Gastro-esophageal reflux disease with esophagitis, without bleeding: Secondary | ICD-10-CM | POA: Diagnosis not present

## 2022-04-01 MED ORDER — OMEPRAZOLE 40 MG PO CPDR: 40.0000 mg | DELAYED_RELEASE_CAPSULE | Freq: Two times a day (BID) | ORAL | 0 refills | Status: DC

## 2022-04-01 NOTE — Progress Notes (Deleted)
     [facility] Progress Note  Chief Complaint: ***  Subjective  History: Dean Thompson was seen in the office January 2023 for cough and mucus production with question of GERD is contributing factor.  No prior pulmonary or ENT evaluation. (Had seen allergist) He did not improve on PPI, so was advised to stop it.  January 2023: EGD revealed 2 cm hiatal hernia, mild Schatzki ring open with biopsy forceps, grade a esophagitis.  PPI was resumed.  Colonoscopy with adenomatous polyp, 5-year recall recommended. Saw pulmonary consultant June 2023, they suspected most likely reflux.  PFTs done [facility] June 2023, and they believed it was reflux. ***  ROS: Cardiovascular:  no chest pain Respiratory: no dyspnea  The patient's Past Medical, Family and Social History were reviewed and are on file in the EMR.  Objective:  Med list reviewed  Current Outpatient Medications:    EPINEPHrine 0.3 mg/0.3 mL IJ SOAJ injection, Inject 0.3 mLs (0.3 mg total) into the muscle as needed for anaphylaxis., Disp: 2 each, Rfl: 1   Multiple Vitamin (MULTIVITAMIN WITH MINERALS) TABS, Take 1 tablet by mouth every morning., Disp: , Rfl:    Vital signs in last 24 hrs: There were no vitals filed for this visit. Wt Readings from Last 3 Encounters:  01/22/22 203 lb (92.1 kg)  10/01/21 203 lb (92.1 kg)  07/23/21 198 lb (89.8 kg)    Physical Exam  *** HEENT: sclera anicteric, oral mucosa moist without lesions Neck: supple, no thyromegaly, JVD or lymphadenopathy Cardiac: ***,  no peripheral edema Pulm: clear to auscultation bilaterally, normal RR and effort noted Abdomen: soft, *** tenderness, with active bowel sounds. No guarding or palpable hepatosplenomegaly. Skin; warm and dry, no jaundice or rash  Labs:   ___________________________________________ Radiologic studies:   ____________________________________________ Other:   _____________________________________________ Assessment & Plan   Assessment: No diagnosis found.    Plan:   *** minutes were spent on this encounter (including chart review, history/exam, counseling/coordination of care, and documentation) > 50% of that time was spent on counseling and coordination of care.   Nelida Meuse III

## 2022-04-01 NOTE — Progress Notes (Signed)
[facility] Progress Note  Chief Complaint:  Chief Complaint  Patient presents with   Gastroesophageal Reflux    Pt has mucous in his throat that constantly make him cough.      Subjective  History: Dean Thompson was seen in the office January 2023 for cough and mucus production with question of GERD is contributing factor.  No prior pulmonary or ENT evaluation. (Had seen allergist) He did not improve on PPI, so was advised to stop it.  January 2023: EGD revealed 2 cm hiatal hernia, mild Schatzki ring open with biopsy forceps, grade a esophagitis.  PPI was resumed.  Colonoscopy with adenomatous polyp, 5-year recall recommended. Saw pulmonary consultant June 2023, they suspected most likely reflux.  PFTs done [facility] ENT June 2023, and they believed it was reflux.  He reports that some foods trigger his cough. However, his triggers are inconsistent. He starts coughing immediately after eating these foods. His only consistent food trigger is tomatoes. He reports eating chocolate chip cookies last night which triggered his cough. However, he has eaten the same cookies earlier in the week without any cough. He has these coughing spells x2-3 times per week and seem to occur randomly, sometimes without any known triggers. Sometimes he wakes up coughing and has to walk around and relax to relieve his cough.  He takes Omeprazole sporadically as he did not have any relief when he did take it regularly. Patient states that he takes Zyrtec 4 mornings per week without a change in his cough.  He denies ever having any symptoms of heartburn or regurgitation. He denies any abdominal pain or chest pain.  He has occasional skin lesions on his forearms. He states that his dermatologist has evaluated these and was not concerned.   ROS: Review of Systems  Constitutional:  Negative for fever and weight loss.  HENT:  Negative for hearing loss.   Eyes:  Negative for blurred vision and photophobia.   Respiratory:  Positive for cough. Negative for sputum production.   Cardiovascular:  Negative for chest pain and palpitations.  Gastrointestinal:  Negative for abdominal pain, heartburn and vomiting.  Genitourinary:  Negative for dysuria and hematuria.  Musculoskeletal:  Negative for joint pain and myalgias.  Skin:  Positive for rash (occasional skin lesions on forearms). Negative for itching.  Neurological:  Negative for dizziness, sensory change and headaches.  Endo/Heme/Allergies:  Negative for environmental allergies. Does not bruise/bleed easily.  Psychiatric/Behavioral:  Negative for hallucinations and suicidal ideas.       The patient's Past Medical, Family and Social History were reviewed and are on file in the EMR. Past Medical History:  Diagnosis Date   Allergy    Arthritis    GERD (gastroesophageal reflux disease)    Hyperlipidemia    Rotator cuff tear    left    Objective:  Med list reviewed  Current Outpatient Medications:    Multiple Vitamin (MULTIVITAMIN WITH MINERALS) TABS, Take 1 tablet by mouth every morning., Disp: , Rfl:    omeprazole (PRILOSEC) 40 MG capsule, Take 1 capsule (40 mg total) by mouth in the morning and at bedtime., Disp: 60 capsule, Rfl: 0   EPINEPHrine 0.3 mg/0.3 mL IJ SOAJ injection, Inject 0.3 mLs (0.3 mg total) into the muscle as needed for anaphylaxis. (Patient not taking: Reported on 04/01/2022), Disp: 2 each, Rfl: 1  Periodic omeprazole and Zyrtec as noted above  Vital signs in last 24 hrs: Vitals:   04/01/22 0910  BP: 122/74  Pulse:  74   Wt Readings from Last 3 Encounters:  04/01/22 207 lb (93.9 kg)  01/22/22 203 lb (92.1 kg)  10/01/21 203 lb (92.1 kg)    Physical Exam Well-appearing, pleasant HEENT: sclera anicteric, oral mucosa moist without lesions, Normal vocal quality, no cough during exam Neck: supple, no thyromegaly, JVD or lymphadenopathy Cardiac: RRR,  no peripheral edema Pulm: clear to auscultation bilaterally,  normal RR and effort noted Chest: Rectus diastasis  Abdomen: soft, no tenderness, with active bowel sounds. No guarding or palpable hepatosplenomegaly. Skin; warm and dry, no jaundice or rash, dry skin mostly on his arms, small areas of ecchymoses, no hives  Labs:   ___________________________________________ Radiologic studies:   ____________________________________________ Other:   _____________________________________________ Assessment & Plan   Assessment: Cough, unspecified type - Plan: DG UGI W SINGLE CM (SOL OR THIN BA)  Gastroesophageal reflux disease with esophagitis without hemorrhage - Plan: DG UGI W SINGLE CM (SOL OR THIN BA)   Chronic cough with coexisting GERD demonstrated by reflux esophagitis with small hiatal hernia last year.  We had a long talk about the complex nature of cough and the great difficulty knowing to what extent demonstrable GERD is related to it.  It is curious that his cough often occurs almost immediately after eating, sooner than 1 would expect if it were truly reflux.  I wonder if there is an element of upper airway cough syndrome that may have been postinfectious in nature.  No structural abnormalities on ENT exam.  No improvement on low-dose acid suppression. On allergy meds semiregularly without improvement.  I explained that we need to undergo a trial of maximal acid suppression as well as upper GI series to evaluate the reflux further.  After that I will communicate with pulmonary and see if they may consider a trial of amitriptyline or gabapentin for possible upper airway cough syndrome.  That would be preferable prior to considering an invasive procedure such as TIF or fundoplication for his reflux, given the uncertain extent to which it is contributing to the cough. Plan:  Upper GI series- Discuss results with pulmonology  1 month trial Omeprazole BID  Follow-up 4 to 6 weeks.   30 minutes were spent on this encounter (including chart  review, history/exam, counseling/coordination of care, and documentation) > 50% of that time was spent on counseling and coordination of care.   Carleene Cooper III, MD, have reviewed all documentation for this visit. The documentation on 04/01/22 for the exam, diagnosis, procedures, and orders are all accurate and complete.    I,Alexis Herring,acting as a Education administrator for [place], MD.,have documented all relevant documentation on the behalf of [name], MD,as directed by  [name], MD while in the presence of [name], MD.

## 2022-04-01 NOTE — Patient Instructions (Signed)
_______________________________________________________  If your blood pressure at your visit was 140/90 or greater, please contact your primary care physician to follow up on this.  _______________________________________________________  If you are age 72 or older, your body mass index should be between 23-30. Your Body mass index is 29.7 kg/m. If this is out of the aforementioned range listed, please consider follow up with your Primary Care Provider.  If you are age 74 or younger, your body mass index should be between 19-25. Your Body mass index is 29.7 kg/m. If this is out of the aformentioned range listed, please consider follow up with your Primary Care Provider.   ________________________________________________________  The [place] [facility] providers would like to encourage you to use [facility] to communicate with providers for non-urgent requests or questions.  Due to long hold times on the telephone, sending your provider a message by [facility] may be a faster and more efficient way to get a response.  Please allow 48 business hours for a response.  Please remember that this is for non-urgent requests.  _______________________________________________________  Dean Thompson have been scheduled for an Upper GI Series at 10:00 am. Your appointment is on Monday 04/13/22 at [facility] . Please arrive 30 minutes prior to your test for registration. Make sure not to eat or drink anything after midnight on the night before your test. If you need to reschedule, please call radiology at [phone]. ________________________________________________________________ An upper GI series uses x rays to help diagnose problems of the upper GI tract, which includes the esophagus, stomach, and duodenum. The duodenum is the first part of the small intestine. An upper GI series is conducted by a radiology technologist or a radiologist--a doctor who specializes in x-ray imaging--at a hospital or outpatient center. While  sitting or standing in front of an x-ray machine, the patient drinks barium liquid, which is often white and has a chalky consistency and taste. The barium liquid coats the lining of the upper GI tract and makes signs of disease show up more clearly on x rays. X-ray video, called fluoroscopy, is used to view the barium liquid moving through the esophagus, stomach, and duodenum. Additional x rays and fluoroscopy are performed while the patient lies on an x-ray table. To fully coat the upper GI tract with barium liquid, the technologist or radiologist may press on the abdomen or ask the patient to change position. Patients hold still in various positions, allowing the technologist or radiologist to take x rays of the upper GI tract at different angles. If a technologist conducts the upper GI series, a radiologist will later examine the images to look for problems.  This test typically takes about 1 hour to complete. __________________________________________________________________

## 2022-04-13 ENCOUNTER — Other Ambulatory Visit: Payer: Self-pay | Admitting: Gastroenterology

## 2022-04-13 ENCOUNTER — Ambulatory Visit (HOSPITAL_COMMUNITY)
Admission: RE | Admit: 2022-04-13 | Discharge: 2022-04-13 | Disposition: A | Discharge status: Home / Self Care | Payer: Medicare Other | Source: Ambulatory Visit | Attending: Gastroenterology | Admitting: Gastroenterology

## 2022-04-13 DIAGNOSIS — K449 Diaphragmatic hernia without obstruction or gangrene: Secondary | ICD-10-CM | POA: Diagnosis not present

## 2022-04-13 DIAGNOSIS — R059 Cough, unspecified: Secondary | ICD-10-CM | POA: Diagnosis not present

## 2022-04-13 DIAGNOSIS — K2100 Gastro-esophageal reflux disease with esophagitis, without bleeding: Secondary | ICD-10-CM

## 2022-05-14 ENCOUNTER — Ambulatory Visit (INDEPENDENT_AMBULATORY_CARE_PROVIDER_SITE_OTHER): Payer: Medicare Other | Admitting: Gastroenterology

## 2022-05-14 ENCOUNTER — Encounter: Payer: Self-pay | Admitting: Gastroenterology

## 2022-05-14 VITALS — BP 98/66 | HR 84 | Ht 69.0 in | Wt 203.2 lb

## 2022-05-14 DIAGNOSIS — R053 Chronic cough: Secondary | ICD-10-CM | POA: Diagnosis not present

## 2022-05-14 DIAGNOSIS — K219 Gastro-esophageal reflux disease without esophagitis: Secondary | ICD-10-CM

## 2022-05-14 MED ORDER — OMEPRAZOLE 40 MG PO CPDR: DELAYED_RELEASE_CAPSULE | ORAL | 0 refills | Status: AC

## 2022-05-14 NOTE — Patient Instructions (Addendum)
Please take Prilosec once daily for two weeks. Then take every other day for 10 to 14 days. Then Stop.  It was a pleasure to see you today!  Thank you for trusting me with your gastrointestinal care!      If your blood pressure at your visit was 140/90 or greater, please contact your primary care physician to follow up on this.  _______________________________________________________  If you are age 72 or older, your body mass index should be between 23-30. Your Body mass index is 30.01 kg/m. If this is out of the aforementioned range listed, please consider follow up with your Primary Care Provider.  If you are age 61 or younger, your body mass index should be between 19-25. Your Body mass index is 30.01 kg/m. If this is out of the aformentioned range listed, please consider follow up with your Primary Care Provider.   ________________________________________________________  The [place] [facility] providers would like to encourage you to use [facility] to communicate with providers for non-urgent requests or questions.  Due to long hold times on the telephone, sending your provider a message by [facility] may be a faster and more efficient way to get a response.  Please allow 48 business hours for a response.  Please remember that this is for non-urgent requests.  _______________________________________________________  Due to recent changes in healthcare laws, you may see the results of your imaging and laboratory studies on MyChart before your provider has had a chance to review them.  We understand that in some cases there may be results that are confusing or concerning to you. Not all laboratory results come back in the same time frame and the provider may be waiting for multiple results in order to interpret others.  Please give [place] 48 hours in order for your provider to thoroughly review all the results before contacting the office for clarification of your results.

## 2022-05-14 NOTE — Progress Notes (Signed)
[facility] Progress Note  Chief Complaint: Chronic cough Chief Complaint  Patient presents with   Cough    better   Gastroesophageal Reflux    Reflux and cough is better since taking omeprazole twice a day    Subjective  History: From my 04/01/2022 office note: "Chronic cough with coexisting GERD demonstrated by reflux esophagitis with small hiatal hernia last year.  We had a long talk about the complex nature of cough and the great difficulty knowing to what extent demonstrable GERD is related to it.  It is curious that his cough often occurs almost immediately after eating, sooner than 1 would expect if it were truly reflux.  I wonder if there is an element of upper airway cough syndrome that may have been postinfectious in nature.  No structural abnormalities on ENT exam.  No improvement on low-dose acid suppression. On allergy meds semiregularly without improvement.   I explained that we need to undergo a trial of maximal acid suppression as well as upper GI series to evaluate the reflux further.  After that I will communicate with pulmonary and see if they may consider a trial of amitriptyline or gabapentin for possible upper airway cough syndrome.  That would be preferable prior to considering an invasive procedure such as TIF or fundoplication for his reflux, given the uncertain extent to which it is contributing to the cough." _________________  Today, he reports feeling better. He states he's only had 2 episodes of coughing since his last visit. He reports taking Zyrtec for his allergies. He is compliant with Omeprazole 40 mg twice daily, one in the am and one in the pm and states that he has not experienced any side effects with this medication.  Even prior to starting this, he was not having regular heartburn or regurgitation.  Denies nausea vomiting dysphagia or weight loss.  He denies being on any other cough treatments.   He denies any food regurgitation, dysphagia,  wheezing, or phlegm.     ROS: Review of Systems  Constitutional:  Negative for appetite change and fever.  HENT:  Negative for trouble swallowing.   Respiratory:  Positive for cough. Negative for shortness of breath and wheezing.   Cardiovascular:  Negative for chest pain.  Gastrointestinal:  Negative for abdominal distention, abdominal pain, anal bleeding, blood in stool, constipation, diarrhea, nausea, rectal pain and vomiting.  Genitourinary:  Negative for dysuria.  Musculoskeletal:  Negative for back pain.  Skin:  Negative for rash.  Neurological:  Negative for weakness.  All other systems reviewed and are negative.  The patient's Past Medical, Family and Social History were reviewed and are on file in the EMR.  Objective:  Med list reviewed  Current Outpatient Medications:    cetirizine (ZYRTEC) 10 MG tablet, Take 10 mg by mouth daily., Disp: , Rfl:    Multiple Vitamin (MULTIVITAMIN WITH MINERALS) TABS, Take 1 tablet by mouth every morning., Disp: , Rfl:    omeprazole (PRILOSEC) 40 MG capsule, Take 1 capsule (40 mg total) by mouth in the morning and at bedtime., Disp: 60 capsule, Rfl: 0   EPINEPHrine 0.3 mg/0.3 mL IJ SOAJ injection, Inject 0.3 mLs (0.3 mg total) into the muscle as needed for anaphylaxis. (Patient not taking: Reported on 05/14/2022), Disp: 2 each, Rfl: 1   Vital signs in last 24 hrs: Vitals:   05/14/22 1055  BP: 98/66  Pulse: 84   Wt Readings from Last 3 Encounters:  05/14/22 203 lb 4 oz (92.2  kg)  04/01/22 207 lb (93.9 kg)  01/22/22 203 lb (92.1 kg)    Physical Exam  Well-appearing. Not coughing during the visit, normal vocal quality No additional exam.  Entire visit spent in review of symptoms, results and plan.  Labs:   ___________________________________________ Radiologic studies:  CLINICAL DATA:  72 year old male for upper GI   EXAM: UPPER GI SERIES WITH HIGH DENSITY WITHOUT KUB   TECHNIQUE: Scout radiograph was obtained. Combined  double and single contrast examination was performed using effervescent crystals, high-density barium and thin liquid barium. This exam was performed by Narda Rutherford, NP, and was supervised and interpreted by Corrie Mckusick, MD.   FLUOROSCOPY: Radiation Exposure Index (as provided by the fluoroscopic device): 3.70 mGy Kerma   COMPARISON:  None Available.   FINDINGS: Scout Radiograph:  Unremarkable   Esophagus: Normal appearance.   Esophageal motility: Within normal limits.   Gastroesophageal reflux: Trace gastroesophageal reflux noted to distal esophagus without provocation maneuvers.   Ingested 13mm barium tablet: 13 mm barium tablet passed normally.   Stomach: Small hiatal hernia.   Gastric emptying: Normal.   Duodenum: Normal appearance.   Other:  None.   IMPRESSION: Trace gastroesophageal reflux.   Small hiatal hernia.   Read by: Narda Rutherford, AGNP-BC     Electronically Signed   By: Corrie Mckusick D.O.   On: 04/13/2022 11:33   ____________________________________________ Other:   _____________________________________________ Assessment & Plan  Assessment: Chronic cough  Gastroesophageal reflux disease without esophagitis  We know he has reflux based on the GIS but also findings of mild esophagitis on EGD a year ago.  We again discussed the complex nature of cough and the fact that often has multiple contributing factors.  The fact that he is reporting significant improvement in the cough on high-dose acid suppression strongly suggest that reflux is a significant contributing factor. I would like to see what happens when we slowly wean off the medicine so that we can have even greater clinical certainty and know the right dose and frequency at night for him to use.  We talked in some more detail about the possibility of TIF or surgical fundoplication for some patients with reflux.  Given the complex nature of cough, it is still difficult to know the extent to  which she will have durable relief of the cough even if he has an antireflux procedure.  I will forward this case to my partner Dr. Bryan Lemma to see if he thinks that this is potentially candidate for TIF  with the hiatal hernia that is present..  I also told Newt that staying on chronic acid suppression is also an option and that it is generally safe as long as his bone health, magnesium and iron levels are periodically monitored.  It sounds like he would more likely favor that more conservative approach then an antireflux procedure.   Plan: -Taper off Omeprazole 40 mg to once daily for 2 weeks. Then once every other day for 10-14 days.   -Then contact me by phone or portal message in several weeks with an update and we will proceed accordingly.    Wilfrid Lund, MD    Velora Heckler Lanette Hampshire, [place], MD, have reviewed all documentation for this visit. The documentation on 05/14/22 for the exam, diagnosis, procedures, and orders are all accurate and complete.  I,Safa M Kadhim,acting as a scribe for [place], MD.,have documented all relevant documentation on the behalf of [place], MD,as directed  by  Nelida Meuse III, MD while in the presence of [name], MD.   Salina April III, MD, have reviewed all documentation for this visit. The documentation on 05/14/22 for the exam, diagnosis, procedures, and orders are all accurate and complete.

## 2022-05-14 NOTE — Progress Notes (Deleted)
GI Progress Note  Chief Complaint: Chronic cough  Subjective  History: From my 04/01/2022 office note: "Chronic cough with coexisting GERD demonstrated by reflux esophagitis with small hiatal hernia last year.  We had a long talk about the complex nature of cough and the great difficulty knowing to what extent demonstrable GERD is related to it.  It is curious that his cough often occurs almost immediately after eating, sooner than 1 would expect if it were truly reflux.  I wonder if there is an element of upper airway cough syndrome that may have been postinfectious in nature.  No structural abnormalities on ENT exam.  No improvement on low-dose acid suppression. On allergy meds semiregularly without improvement.   I explained that we need to undergo a trial of maximal acid suppression as well as upper GI series to evaluate the reflux further.  After that I will communicate with pulmonary and see if they may consider a trial of amitriptyline or gabapentin for possible upper airway cough syndrome.  That would be preferable prior to considering an invasive procedure such as TIF or fundoplication for his reflux, given the uncertain extent to which it is contributing to the cough." _________________  ***  ROS: Cardiovascular:  no chest pain Respiratory: no dyspnea  The patient's Past Medical, Family and Social History were reviewed and are on file in the EMR.  Objective:  Med list reviewed  Current Outpatient Medications:    EPINEPHrine 0.3 mg/0.3 mL IJ SOAJ injection, Inject 0.3 mLs (0.3 mg total) into the muscle as needed for anaphylaxis. (Patient not taking: Reported on 04/01/2022), Disp: 2 each, Rfl: 1   Multiple Vitamin (MULTIVITAMIN WITH MINERALS) TABS, Take 1 tablet by mouth every morning., Disp: , Rfl:    omeprazole (PRILOSEC) 40 MG capsule, Take 1 capsule (40 mg total) by mouth in the morning and at bedtime., Disp: 60 capsule, Rfl: 0   Vital signs in last 24  hrs: There were no vitals filed for this visit. Wt Readings from Last 3 Encounters:  04/01/22 207 lb (93.9 kg)  01/22/22 203 lb (92.1 kg)  10/01/21 203 lb (92.1 kg)    Physical Exam  *** HEENT: sclera anicteric, oral mucosa moist without lesions Neck: supple, no thyromegaly, JVD or lymphadenopathy Cardiac: ***,  no peripheral edema Pulm: clear to auscultation bilaterally, normal RR and effort noted Abdomen: soft, *** tenderness, with active bowel sounds. No guarding or palpable hepatosplenomegaly. Skin; warm and dry, no jaundice or rash  Labs:   ___________________________________________ Radiologic studies:  CLINICAL DATA:  72 year old male for upper GI   EXAM: UPPER GI SERIES WITH HIGH DENSITY WITHOUT KUB   TECHNIQUE: Scout radiograph was obtained. Combined double and single contrast examination was performed using effervescent crystals, high-density barium and thin liquid barium. This exam was performed by Narda Rutherford, NP, and was supervised and interpreted by Corrie Mckusick, MD.   FLUOROSCOPY: Radiation Exposure Index (as provided by the fluoroscopic device): 3.70 mGy Kerma   COMPARISON:  None Available.   FINDINGS: Scout Radiograph:  Unremarkable   Esophagus: Normal appearance.   Esophageal motility: Within normal limits.   Gastroesophageal reflux: Trace gastroesophageal reflux noted to distal esophagus without provocation maneuvers.   Ingested 88mm barium tablet: 13 mm barium tablet passed normally.   Stomach: Small hiatal hernia.   Gastric emptying: Normal.   Duodenum: Normal appearance.   Other:  None.   IMPRESSION: Trace gastroesophageal reflux.   Small hiatal hernia.   Read by: Narda Rutherford,  AGNP-BC     Electronically Signed   By: Corrie Mckusick D.O.   On: 04/13/2022 11:33   ____________________________________________ Other:   _____________________________________________ Assessment & Plan  Assessment: No diagnosis  found.    Plan:   *** minutes were spent on this encounter (including chart review, history/exam, counseling/coordination of care, and documentation) > 50% of that time was spent on counseling and coordination of care.   Nelida Meuse III

## 2022-06-16 DIAGNOSIS — L814 Other melanin hyperpigmentation: Secondary | ICD-10-CM | POA: Diagnosis not present

## 2022-06-16 DIAGNOSIS — L538 Other specified erythematous conditions: Secondary | ICD-10-CM | POA: Diagnosis not present

## 2022-06-16 DIAGNOSIS — L821 Other seborrheic keratosis: Secondary | ICD-10-CM | POA: Diagnosis not present

## 2022-06-16 DIAGNOSIS — Z85828 Personal history of other malignant neoplasm of skin: Secondary | ICD-10-CM | POA: Diagnosis not present

## 2022-06-16 DIAGNOSIS — L820 Inflamed seborrheic keratosis: Secondary | ICD-10-CM | POA: Diagnosis not present

## 2022-06-16 DIAGNOSIS — D1801 Hemangioma of skin and subcutaneous tissue: Secondary | ICD-10-CM | POA: Diagnosis not present

## 2022-06-16 DIAGNOSIS — Z08 Encounter for follow-up examination after completed treatment for malignant neoplasm: Secondary | ICD-10-CM | POA: Diagnosis not present

## 2022-06-16 DIAGNOSIS — Z789 Other specified health status: Secondary | ICD-10-CM | POA: Diagnosis not present

## 2022-06-16 DIAGNOSIS — L298 Other pruritus: Secondary | ICD-10-CM | POA: Diagnosis not present

## 2022-07-02 ENCOUNTER — Encounter: Payer: Self-pay | Admitting: Gastroenterology

## 2022-07-31 ENCOUNTER — Ambulatory Visit (INDEPENDENT_AMBULATORY_CARE_PROVIDER_SITE_OTHER): Payer: Medicare Other | Admitting: Family Medicine

## 2022-07-31 VITALS — BP 120/68 | HR 73 | Temp 98.7°F | Wt 208.8 lb

## 2022-07-31 DIAGNOSIS — M25471 Effusion, right ankle: Secondary | ICD-10-CM | POA: Diagnosis not present

## 2022-07-31 DIAGNOSIS — M25571 Pain in right ankle and joints of right foot: Secondary | ICD-10-CM | POA: Diagnosis not present

## 2022-07-31 MED ORDER — PREDNISONE 10 MG PO TABS: ORAL_TABLET | ORAL | 0 refills | Status: DC

## 2022-07-31 NOTE — Progress Notes (Signed)
Established Patient Office Visit   Subjective  Patient ID: Dean Thompson, male    DOB: June 13, 1950  Age: 72 y.o. MRN: [id number]  Chief Complaint  Patient presents with   Joint Swelling    Woke up Tuesday morning and rt ankle was swollen, slightly painful. Has not taken or tried anything    Patient is a 72 year old male followed by Shirline Frees, NP and seen for acute concern.  Patient endorses waking up 2 days ago with right ankle pain, edema, erythema.  Has difficulty bearing weight that morning.  Patient denies injury.  Has never had this happen before.  Patient endorses eating hotdogs, rice, and chili the night before.  Wrapped ankle with Ace bandage for support.  Wearing flip-flops.  Inquires if current symptoms can be related to right great toenail that is fallen off multiple times over the years.    Past Medical History:  Diagnosis Date   Allergy    Arthritis    GERD (gastroesophageal reflux disease)    Hyperlipidemia    Rotator cuff tear    left   Past Surgical History:  Procedure Laterality Date   COLONOSCOPY  2004   in [place] and was normal per pt   CYSTECTOMY  2006   subacious cyst  remove from neck   EXCISION METACARPAL MASS Right 05/21/2020   Procedure: EXCISION MUCOID CYST WITH DISTAL INTERPHALANGEAL JOINT ARTHROTOMY RIGHT MIDDLE FINGER;  Surgeon: Cindee Salt, MD;  Location: [facility];  Service: Orthopedics;  Laterality: Right;   ROTATOR CUFF REPAIR Right 1999   SEPTOPLASTY  1982   SHOULDER OPEN ROTATOR CUFF REPAIR  10/01/2011   Procedure: ROTATOR CUFF REPAIR SHOULDER OPEN;  Surgeon: Drucilla Schmidt, MD;  Location: [facility];  Service: Orthopedics;  Laterality: Left;  Left Shoulder Acrominectomy/Open Rotator Cuff Repair/Distal Clavicle Resection   SPINE SURGERY  2018   Social History   Tobacco Use   Smoking status: Never   Smokeless tobacco: Never  Vaping Use   Vaping Use: Never used  Substance Use Topics   Alcohol use: Not Currently     Comment: rarely   Drug use: Never   Family History  Adopted: Yes  Problem Relation Age of Onset   Heart disease Father    Colon cancer Neg Hx    Colon polyps Neg Hx    Rectal cancer Neg Hx    Stomach cancer Neg Hx    Asthma Neg Hx    Allergic rhinitis Neg Hx    Immunodeficiency Neg Hx    Eczema Neg Hx    Urticaria Neg Hx    Angioedema Neg Hx    Allergies  Allergen Reactions   Wasp Venom Anaphylaxis   Citrus Other (See Comments)    "nasal congestion" "nasal congestion"   Other     Spicy foods and tomato based foods cause a cough, uses Loratadine with relief      ROS Negative unless stated above    Objective:     BP 120/68 (BP Location: Right Arm, Patient Position: Sitting, Cuff Size: Large)   Pulse 73   Temp 98.7 F (37.1 C) (Oral)   Wt 208 lb 12.8 oz (94.7 kg)   SpO2 97%   BMI 30.83 kg/m    Physical Exam Constitutional:      General: He is not in acute distress.    Appearance: Normal appearance.  HENT:     Head: Normocephalic and atraumatic.     Nose: Nose normal.  Mouth/Throat:     Mouth: Mucous membranes are moist.  Cardiovascular:     Rate and Rhythm: Normal rate.     Heart sounds: No murmur heard.    No gallop.  Pulmonary:     Effort: Pulmonary effort is normal. No respiratory distress.     Breath sounds: No wheezing, rhonchi or rales.  Musculoskeletal:     Right foot: Deformity present.       Legs:     Comments: Large bone spur mid right foot.  Lateral right ankle with increased edema, TTP, increased warmth, erythema.  Pain with flexion, dorsiflexion, eversion and inversion of right foot.  Skin:    General: Skin is warm and dry.     Comments: Right ankle with edema, erythema, increased warmth.  Neurological:     Mental Status: He is alert and oriented to person, place, and time.      No results found for any visits on 07/31/22.    Assessment & Plan:  Acute right ankle pain -     predniSONE; Take 5 tabs on day 1, 4 tabs on day 2,  3 tabs on day 3, 2 tabs on day 4, 1 tab on day 5.  Dispense: 15 tablet; Refill: 0  Edema of right ankle -     predniSONE; Take 5 tabs on day 1, 4 tabs on day 2, 3 tabs on day 3, 2 tabs on day 4, 1 tab on day 5.  Dispense: 15 tablet; Refill: 0  New problem.  Discussed symptoms likely 2/2 gout given recent intake of hot dogs.  Also consider pseudogout or psoriatic arthritis given history of toenail.  Lab and x-ray unavailable in clinic at time of appointment.  Increase intake of water, NSAIDs, ice, elevation.  Prednisone taper.  Return if symptoms worsen or fail to improve.   Dean Saint, MD

## 2022-07-31 NOTE — Patient Instructions (Signed)
Prescription for prednisone was sent to your pharmacy.  You can also take ibuprofen to help relieve the discomfort and swelling.  Ice and elevation of your ankle are also helpful.

## 2022-09-16 ENCOUNTER — Encounter (INDEPENDENT_AMBULATORY_CARE_PROVIDER_SITE_OTHER): Payer: Self-pay

## 2022-10-08 ENCOUNTER — Ambulatory Visit (INDEPENDENT_AMBULATORY_CARE_PROVIDER_SITE_OTHER): Payer: Medicare Other | Admitting: Adult Health

## 2022-10-08 ENCOUNTER — Encounter: Payer: Self-pay | Admitting: Adult Health

## 2022-10-08 VITALS — BP 118/68 | HR 72 | Temp 97.8°F | Ht 68.5 in | Wt 198.0 lb

## 2022-10-08 DIAGNOSIS — K219 Gastro-esophageal reflux disease without esophagitis: Secondary | ICD-10-CM | POA: Diagnosis not present

## 2022-10-08 DIAGNOSIS — N400 Enlarged prostate without lower urinary tract symptoms: Secondary | ICD-10-CM

## 2022-10-08 DIAGNOSIS — E782 Mixed hyperlipidemia: Secondary | ICD-10-CM

## 2022-10-08 DIAGNOSIS — M109 Gout, unspecified: Secondary | ICD-10-CM

## 2022-10-08 LAB — COMPREHENSIVE METABOLIC PANEL
ALT: 33 U/L (ref 0–53)
AST: 27 U/L (ref 0–37)
Albumin: 4.5 g/dL (ref 3.5–5.2)
Alkaline Phosphatase: 104 U/L (ref 39–117)
BUN: 23 mg/dL (ref 6–23)
CO2: 27 meq/L (ref 19–32)
Calcium: 9.3 mg/dL (ref 8.4–10.5)
Chloride: 108 meq/L (ref 96–112)
Creatinine, Ser: 1.33 mg/dL (ref 0.40–1.50)
GFR: 53.46 mL/min — ABNORMAL LOW (ref >60.00)
Glucose, Bld: 97 mg/dL (ref 70–99)
Potassium: 4.9 meq/L (ref 3.5–5.1)
Sodium: 143 meq/L (ref 135–145)
Total Bilirubin: 1.2 mg/dL (ref 0.2–1.2)
Total Protein: 7.1 g/dL (ref 6.0–8.3)

## 2022-10-08 LAB — LIPID PANEL
Cholesterol: 210 mg/dL — ABNORMAL HIGH (ref 0–200)
HDL: 32.1 mg/dL — ABNORMAL LOW (ref >39.00)
LDL Cholesterol: 140 mg/dL — ABNORMAL HIGH (ref 0–99)
NonHDL: 178.33
Total CHOL/HDL Ratio: 7
Triglycerides: 190 mg/dL — ABNORMAL HIGH (ref 0.0–149.0)
VLDL: 38 mg/dL (ref 0.0–40.0)

## 2022-10-08 LAB — CBC
HCT: 46 % (ref 39.0–52.0)
Hemoglobin: 15.5 g/dL (ref 13.0–17.0)
MCHC: 33.7 g/dL (ref 30.0–36.0)
MCV: 90.4 fl (ref 78.0–100.0)
Platelets: 152 10*3/uL (ref 150.0–400.0)
RBC: 5.09 Mil/uL (ref 4.22–5.81)
RDW: 14 % (ref 11.5–15.5)
WBC: 6.7 10*3/uL (ref 4.0–10.5)

## 2022-10-08 LAB — URIC ACID: Uric Acid, Serum: 10.1 mg/dL — ABNORMAL HIGH (ref 4.0–7.8)

## 2022-10-08 NOTE — Progress Notes (Signed)
Subjective:    Patient ID: EMERY DEKAM, male    DOB: 1950/03/17, 72 y.o.   MRN: [id number]  HPI Patient presents for yearly preventative medicine examination. He is a pleasant 72 year old male who  has a past medical history of Allergy, Arthritis, GERD (gastroesophageal reflux disease), Hyperlipidemia, and Rotator cuff tear.  Hyperlipidemia -not currently on any medication.  He has been working on lifestyle modifications.  Lab Results  Component Value Date   CHOL 180 11/06/2020   HDL 33.30 (L) 11/06/2020   LDLCALC 109 (H) 11/06/2020   LDLDIRECT 109.0 10/13/2018   TRIG 189.0 (H) 11/06/2020   CHOLHDL 5 11/06/2020   BPH-asymptomatic.  Not on any medication  GERD - takes Prilosec 20 PRN - takes a few times a week. Reports works well when he does take it.   Gout Flare - has had two gout flares in his right ankle over the last few months. His last flare being about 2 weeks ago. He will drink tart cherry juice and this will resolve it most of the time. He was seen in June and needed prednisone for suspected gout flare   All immunizations and health maintenance protocols were reviewed with the patient and needed orders were placed. Advised on shingles vaccination.   Appropriate screening laboratory values were ordered for the patient including screening of hyperlipidemia, renal function and hepatic function. If indicated by BPH, a PSA was ordered.  Medication reconciliation,  past medical history, social history, problem list and allergies were reviewed in detail with the patient  Goals were established with regard to weight loss, exercise, and  diet in compliance with medications. He has not been exercising as much as he used to but is eating healthy    Review of Systems  Constitutional: Negative.   HENT: Negative.    Eyes: Negative.   Respiratory: Negative.    Cardiovascular: Negative.   Gastrointestinal: Negative.   Endocrine: Negative.   Genitourinary: Negative.    Musculoskeletal: Negative.   Skin: Negative.   Allergic/Immunologic: Negative.   Neurological: Negative.   Hematological: Negative.   Psychiatric/Behavioral: Negative.    All other systems reviewed and are negative.  Past Medical History:  Diagnosis Date   Allergy    Arthritis    GERD (gastroesophageal reflux disease)    Hyperlipidemia    Rotator cuff tear    left    Social History   Socioeconomic History   Marital status: Married    Spouse name: Not on file   Number of children: 3   Years of education: Not on file   Highest education level: Master's degree (e.g., MA, MS, MEng, MEd, MSW, MBA)  Occupational History   Not on file  Tobacco Use   Smoking status: Never   Smokeless tobacco: Never  Vaping Use   Vaping status: Never Used  Substance and Sexual Activity   Alcohol use: Not Currently    Comment: rarely   Drug use: Never   Sexual activity: Yes  Other Topics Concern   Not on file  Social History Narrative   [employer] - owner    Divorced   Three daughters - [place] lives in [place], [place],  [place], [place]       He likes to golf.       Social Determinants of Health   Financial Resource Strain: Low Risk  (07/31/2022)   Overall Financial Resource Strain (CARDIA)    Difficulty of Paying Living Expenses: Not hard at all  Food Insecurity: No Food Insecurity (07/31/2022)   Hunger Vital Sign    Worried About Running Out of Food in the Last Year: Never true    Ran Out of Food in the Last Year: Never true  Transportation Needs: No Transportation Needs (07/31/2022)   PRAPARE - Administrator, Civil Service (Medical): No    Lack of Transportation (Non-Medical): No  Physical Activity: Insufficiently Active (07/31/2022)   Exercise Vital Sign    Days of Exercise per Week: 3 days    Minutes of Exercise per Session: 30 min  Stress: No Stress Concern Present (07/31/2022)   [employer] of Occupational Health - Occupational Stress Questionnaire     Feeling of Stress : Not at all  Social Connections: Socially Integrated (07/31/2022)   Social Connection and Isolation Panel [NHANES]    Frequency of Communication with Friends and Family: More than three times a week    Frequency of Social Gatherings with Friends and Family: Twice a week    Attends Religious Services: More than 4 times per year    Active Member of [employer] or Organizations: Yes    Attends Engineer, structural: More than 4 times per year    Marital Status: Married  Catering manager Violence: Not At Risk (01/22/2022)   Humiliation, Afraid, Rape, and Kick questionnaire    Fear of Current or Ex-Partner: No    Emotionally Abused: No    Physically Abused: No    Sexually Abused: No    Past Surgical History:  Procedure Laterality Date   COLONOSCOPY  2004   in [place] and was normal per pt   CYSTECTOMY  2006   subacious cyst  remove from neck   EXCISION METACARPAL MASS Right 05/21/2020   Procedure: EXCISION MUCOID CYST WITH DISTAL INTERPHALANGEAL JOINT ARTHROTOMY RIGHT MIDDLE FINGER;  Surgeon: Cindee Salt, MD;  Location: [facility];  Service: Orthopedics;  Laterality: Right;   ROTATOR CUFF REPAIR Right 1999   SEPTOPLASTY  1982   SHOULDER OPEN ROTATOR CUFF REPAIR  10/01/2011   Procedure: ROTATOR CUFF REPAIR SHOULDER OPEN;  Surgeon: Drucilla Schmidt, MD;  Location: [facility];  Service: Orthopedics;  Laterality: Left;  Left Shoulder Acrominectomy/Open Rotator Cuff Repair/Distal Clavicle Resection   SPINE SURGERY  2018    Family History  Adopted: Yes  Problem Relation Age of Onset   Heart disease Father    Colon cancer Neg Hx    Colon polyps Neg Hx    Rectal cancer Neg Hx    Stomach cancer Neg Hx    Asthma Neg Hx    Allergic rhinitis Neg Hx    Immunodeficiency Neg Hx    Eczema Neg Hx    Urticaria Neg Hx    Angioedema Neg Hx     Allergies  Allergen Reactions   Wasp Venom Anaphylaxis   Citrus Other (See Comments)    "nasal congestion" "nasal  congestion"   Other     Spicy foods and tomato based foods cause a cough, uses Loratadine with relief    Current Outpatient Medications on File Prior to Visit  Medication Sig Dispense Refill   cetirizine (ZYRTEC) 10 MG tablet Take 10 mg by mouth daily.     EPINEPHrine 0.3 mg/0.3 mL IJ SOAJ injection Inject 0.3 mLs (0.3 mg total) into the muscle as needed for anaphylaxis. 2 each 1   Multiple Vitamin (MULTIVITAMIN WITH MINERALS) TABS Take 1 tablet by mouth every morning.     omeprazole (PRILOSEC) 40  MG capsule Take once daily for 2 weeks. Then take every other day for 10 to 14 days then STOP (Patient taking differently: Take 20 mg by mouth daily.) 30 capsule 0   No current facility-administered medications on file prior to visit.    BP 118/68 (BP Location: Right Arm, Patient Position: Sitting, Cuff Size: Normal)   Pulse 72   Temp 97.8 F (36.6 C) (Oral)   Ht 5' 8.5" (1.74 m)   Wt 198 lb (89.8 kg)   SpO2 97%   BMI 29.67 kg/m       Objective:   Physical Exam Vitals and nursing note reviewed.  Constitutional:      General: He is not in acute distress.    Appearance: Normal appearance. He is not ill-appearing.  HENT:     Head: Normocephalic and atraumatic.     Right Ear: Tympanic membrane, ear canal and external ear normal. There is no impacted cerumen.     Left Ear: Tympanic membrane, ear canal and external ear normal. There is no impacted cerumen.     Nose: Nose normal. No congestion or rhinorrhea.     Mouth/Throat:     Mouth: Mucous membranes are moist.     Pharynx: Oropharynx is clear.  Eyes:     Extraocular Movements: Extraocular movements intact.     Conjunctiva/sclera: Conjunctivae normal.     Pupils: Pupils are equal, round, and reactive to light.  Neck:     Vascular: No carotid bruit.  Cardiovascular:     Rate and Rhythm: Normal rate and regular rhythm.     Pulses: Normal pulses.     Heart sounds: No murmur heard.    No friction rub. No gallop.  Pulmonary:      Effort: Pulmonary effort is normal.     Breath sounds: Normal breath sounds.  Abdominal:     General: Abdomen is flat. Bowel sounds are normal. There is no distension.     Palpations: Abdomen is soft. There is no mass.     Tenderness: There is no abdominal tenderness. There is no guarding or rebound.     Hernia: No hernia is present.  Musculoskeletal:        General: Normal range of motion.     Cervical back: Normal range of motion and neck supple.  Lymphadenopathy:     Cervical: No cervical adenopathy.  Skin:    General: Skin is warm and dry.     Capillary Refill: Capillary refill takes less than 2 seconds.  Neurological:     General: No focal deficit present.     Mental Status: He is alert and oriented to person, place, and time.  Psychiatric:        Mood and Affect: Mood normal.        Behavior: Behavior normal.        Thought Content: Thought content normal.        Judgment: Judgment normal.        Assessment & Plan:  1. Mixed hyperlipidemia - Likely need to be on statin. We did discuss CT calcium score and he will think about this  - Encouraged diet and exercise  - Lipid panel; Future - TSH; Future - CBC; Future - Comprehensive metabolic panel; Future  2. Gastroesophageal reflux disease without esophagitis - Continue Prilosec PRN  - Lipid panel; Future - TSH; Future - CBC; Future - Comprehensive metabolic panel; Future  3. Benign prostatic hyperplasia without lower urinary tract symptoms  - PSA; Future  4. Acute gout of right ankle, unspecified cause - Consider allopurinol  - Uric Acid; Future  Shirline Frees, NP

## 2022-10-08 NOTE — Patient Instructions (Signed)
It was great seeing you today   We will follow up with you regarding your lab work   Please let me know if you need anything   Please get your shingles vaccination at any pharmacy

## 2022-10-09 ENCOUNTER — Other Ambulatory Visit: Payer: Self-pay

## 2022-10-09 DIAGNOSIS — M109 Gout, unspecified: Secondary | ICD-10-CM

## 2022-10-09 LAB — PSA: PSA: 2.34 ng/mL (ref 0.10–4.00)

## 2022-10-09 LAB — TSH: TSH: 1.18 u[IU]/mL (ref 0.35–5.50)

## 2022-10-09 MED ORDER — ALLOPURINOL 100 MG PO TABS
100.0000 mg | ORAL_TABLET | Freq: Every day | ORAL | 6 refills | Status: AC
Start: 2022-10-09 — End: ?

## 2022-10-12 ENCOUNTER — Encounter: Payer: Self-pay | Admitting: Adult Health

## 2022-10-13 ENCOUNTER — Other Ambulatory Visit: Payer: Self-pay | Admitting: Adult Health

## 2022-10-13 MED ORDER — ATORVASTATIN CALCIUM 20 MG PO TABS: 20.0000 mg | ORAL_TABLET | Freq: Every day | ORAL | 3 refills | Status: AC

## 2022-10-13 NOTE — Telephone Encounter (Signed)
Please advise 

## 2022-12-18 ENCOUNTER — Telehealth: Payer: Self-pay | Admitting: Adult Health

## 2022-12-18 NOTE — Telephone Encounter (Signed)
Spoke with patient  Patient stated he moved to [facility] and will find a pcp there
